# Patient Record
Sex: Male | Born: 1945
Health system: Southern US, Community
[De-identification: ages and names within clinical notes are randomized; demographics above are authoritative.]

## PROBLEM LIST (undated history)

## (undated) ENCOUNTER — Emergency Department (HOSPITAL_COMMUNITY): Admission: EM | Payer: PPO | Source: Home / Self Care

## (undated) DIAGNOSIS — K219 Gastro-esophageal reflux disease without esophagitis: Secondary | ICD-10-CM

## (undated) DIAGNOSIS — I471 Supraventricular tachycardia, unspecified: Secondary | ICD-10-CM

## (undated) DIAGNOSIS — F410 Panic disorder [episodic paroxysmal anxiety] without agoraphobia: Secondary | ICD-10-CM

## (undated) DIAGNOSIS — F329 Major depressive disorder, single episode, unspecified: Secondary | ICD-10-CM

## (undated) DIAGNOSIS — F419 Anxiety disorder, unspecified: Secondary | ICD-10-CM

## (undated) DIAGNOSIS — T4145XA Adverse effect of unspecified anesthetic, initial encounter: Secondary | ICD-10-CM

## (undated) DIAGNOSIS — F32A Depression, unspecified: Secondary | ICD-10-CM

## (undated) DIAGNOSIS — I2510 Atherosclerotic heart disease of native coronary artery without angina pectoris: Secondary | ICD-10-CM

## (undated) DIAGNOSIS — I252 Old myocardial infarction: Secondary | ICD-10-CM

## (undated) DIAGNOSIS — F4310 Post-traumatic stress disorder, unspecified: Secondary | ICD-10-CM

## (undated) DIAGNOSIS — C801 Malignant (primary) neoplasm, unspecified: Secondary | ICD-10-CM

## (undated) DIAGNOSIS — Z87442 Personal history of urinary calculi: Secondary | ICD-10-CM

## (undated) DIAGNOSIS — M1990 Unspecified osteoarthritis, unspecified site: Secondary | ICD-10-CM

## (undated) DIAGNOSIS — H409 Unspecified glaucoma: Secondary | ICD-10-CM

## (undated) DIAGNOSIS — T8859XA Other complications of anesthesia, initial encounter: Secondary | ICD-10-CM

## (undated) DIAGNOSIS — I219 Acute myocardial infarction, unspecified: Secondary | ICD-10-CM

## (undated) DIAGNOSIS — I255 Ischemic cardiomyopathy: Secondary | ICD-10-CM

## (undated) DIAGNOSIS — R519 Headache, unspecified: Secondary | ICD-10-CM

## (undated) DIAGNOSIS — E785 Hyperlipidemia, unspecified: Secondary | ICD-10-CM

## (undated) DIAGNOSIS — R51 Headache: Secondary | ICD-10-CM

## (undated) HISTORY — PX: TONSILLECTOMY: SUR1361

## (undated) HISTORY — DX: Atherosclerotic heart disease of native coronary artery without angina pectoris: I25.10

## (undated) HISTORY — DX: Old myocardial infarction: I25.2

## (undated) HISTORY — DX: Ischemic cardiomyopathy: I25.5

## (undated) HISTORY — PX: LITHOTRIPSY: SUR834

## (undated) HISTORY — PX: OTHER SURGICAL HISTORY: SHX169

---

## 1997-10-27 ENCOUNTER — Emergency Department (HOSPITAL_COMMUNITY): Admission: EM | Admit: 1997-10-27 | Discharge: 1997-10-27 | Payer: Self-pay | Admitting: Emergency Medicine

## 1997-10-30 ENCOUNTER — Ambulatory Visit (HOSPITAL_COMMUNITY): Admission: RE | Admit: 1997-10-30 | Discharge: 1997-10-30 | Payer: Self-pay | Admitting: Family Medicine

## 1998-07-03 ENCOUNTER — Encounter: Payer: Self-pay | Admitting: Internal Medicine

## 1998-07-03 ENCOUNTER — Ambulatory Visit (HOSPITAL_COMMUNITY): Admission: RE | Admit: 1998-07-03 | Discharge: 1998-07-03 | Payer: Self-pay | Admitting: Internal Medicine

## 1998-07-24 ENCOUNTER — Encounter: Payer: Self-pay | Admitting: Internal Medicine

## 1998-07-24 ENCOUNTER — Ambulatory Visit (HOSPITAL_COMMUNITY): Admission: RE | Admit: 1998-07-24 | Discharge: 1998-07-24 | Payer: Self-pay | Admitting: Neurology

## 1998-07-24 ENCOUNTER — Emergency Department (HOSPITAL_COMMUNITY): Admission: EM | Admit: 1998-07-24 | Discharge: 1998-07-25 | Payer: Self-pay

## 1999-02-03 ENCOUNTER — Encounter: Admission: RE | Admit: 1999-02-03 | Discharge: 1999-02-03 | Payer: Self-pay | Admitting: Otolaryngology

## 2001-09-25 ENCOUNTER — Ambulatory Visit (HOSPITAL_COMMUNITY): Admission: RE | Admit: 2001-09-25 | Discharge: 2001-09-25 | Payer: Self-pay | Admitting: Gastroenterology

## 2001-09-25 ENCOUNTER — Encounter: Payer: Self-pay | Admitting: Gastroenterology

## 2001-10-18 ENCOUNTER — Encounter: Payer: Self-pay | Admitting: Pulmonary Disease

## 2001-10-18 ENCOUNTER — Encounter: Admission: RE | Admit: 2001-10-18 | Discharge: 2001-10-18 | Payer: Self-pay | Admitting: Pulmonary Disease

## 2001-10-19 ENCOUNTER — Observation Stay (HOSPITAL_COMMUNITY): Admission: EM | Admit: 2001-10-19 | Discharge: 2001-10-20 | Payer: Self-pay | Admitting: Emergency Medicine

## 2001-10-20 ENCOUNTER — Encounter: Payer: Self-pay | Admitting: Urology

## 2001-10-25 ENCOUNTER — Ambulatory Visit (HOSPITAL_BASED_OUTPATIENT_CLINIC_OR_DEPARTMENT_OTHER): Admission: RE | Admit: 2001-10-25 | Discharge: 2001-10-25 | Payer: Self-pay | Admitting: Urology

## 2001-10-25 ENCOUNTER — Encounter: Payer: Self-pay | Admitting: Urology

## 2001-11-08 ENCOUNTER — Encounter: Payer: Self-pay | Admitting: Urology

## 2001-11-08 ENCOUNTER — Encounter: Admission: RE | Admit: 2001-11-08 | Discharge: 2001-11-08 | Payer: Self-pay | Admitting: Urology

## 2001-12-07 ENCOUNTER — Encounter: Admission: RE | Admit: 2001-12-07 | Discharge: 2001-12-07 | Payer: Self-pay | Admitting: Urology

## 2001-12-07 ENCOUNTER — Encounter: Payer: Self-pay | Admitting: Urology

## 2001-12-13 ENCOUNTER — Ambulatory Visit (HOSPITAL_BASED_OUTPATIENT_CLINIC_OR_DEPARTMENT_OTHER): Admission: RE | Admit: 2001-12-13 | Discharge: 2001-12-13 | Payer: Self-pay | Admitting: Urology

## 2001-12-13 ENCOUNTER — Encounter: Payer: Self-pay | Admitting: Urology

## 2001-12-25 ENCOUNTER — Encounter: Admission: RE | Admit: 2001-12-25 | Discharge: 2001-12-25 | Payer: Self-pay | Admitting: Urology

## 2001-12-25 ENCOUNTER — Encounter: Payer: Self-pay | Admitting: Urology

## 2002-12-22 ENCOUNTER — Emergency Department (HOSPITAL_COMMUNITY): Admission: EM | Admit: 2002-12-22 | Discharge: 2002-12-22 | Payer: Self-pay | Admitting: Emergency Medicine

## 2002-12-22 ENCOUNTER — Encounter: Payer: Self-pay | Admitting: Emergency Medicine

## 2003-01-06 ENCOUNTER — Ambulatory Visit (HOSPITAL_COMMUNITY): Admission: RE | Admit: 2003-01-06 | Discharge: 2003-01-06 | Payer: Self-pay | Admitting: Urology

## 2003-01-06 ENCOUNTER — Encounter: Payer: Self-pay | Admitting: Urology

## 2003-09-12 ENCOUNTER — Ambulatory Visit (HOSPITAL_COMMUNITY): Admission: RE | Admit: 2003-09-12 | Discharge: 2003-09-12 | Payer: Self-pay | Admitting: Gastroenterology

## 2003-09-16 ENCOUNTER — Ambulatory Visit (HOSPITAL_COMMUNITY): Admission: RE | Admit: 2003-09-16 | Discharge: 2003-09-16 | Payer: Self-pay | Admitting: Gastroenterology

## 2003-10-23 ENCOUNTER — Ambulatory Visit (HOSPITAL_COMMUNITY): Admission: RE | Admit: 2003-10-23 | Discharge: 2003-10-23 | Payer: Self-pay | Admitting: Gastroenterology

## 2004-04-19 ENCOUNTER — Ambulatory Visit: Payer: Self-pay | Admitting: Pulmonary Disease

## 2005-06-20 ENCOUNTER — Ambulatory Visit: Payer: Self-pay | Admitting: Pulmonary Disease

## 2005-06-22 ENCOUNTER — Ambulatory Visit: Payer: Self-pay

## 2005-10-05 ENCOUNTER — Emergency Department (HOSPITAL_COMMUNITY): Admission: EM | Admit: 2005-10-05 | Discharge: 2005-10-05 | Payer: Self-pay | Admitting: Emergency Medicine

## 2005-10-07 ENCOUNTER — Ambulatory Visit: Payer: Self-pay | Admitting: Internal Medicine

## 2005-12-26 ENCOUNTER — Ambulatory Visit: Payer: Self-pay | Admitting: Internal Medicine

## 2006-08-29 ENCOUNTER — Ambulatory Visit: Payer: Self-pay | Admitting: Pulmonary Disease

## 2006-08-29 LAB — CONVERTED CEMR LAB
ALT: 17 units/L (ref 0–40)
AST: 19 units/L (ref 0–37)
Basophils Relative: 0.6 % (ref 0.0–1.0)
Bilirubin, Direct: 0.1 mg/dL (ref 0.0–0.3)
CO2: 30 meq/L (ref 19–32)
Calcium: 8.9 mg/dL (ref 8.4–10.5)
Chloride: 106 meq/L (ref 96–112)
Creatinine, Ser: 1.2 mg/dL (ref 0.4–1.5)
Eosinophils Absolute: 0.1 10*3/uL (ref 0.0–0.6)
Eosinophils Relative: 2.6 % (ref 0.0–5.0)
GFR calc non Af Amer: 65 mL/min
Glucose, Bld: 92 mg/dL (ref 70–99)
HCT: 43 % (ref 39.0–52.0)
MCV: 94 fL (ref 78.0–100.0)
Neutrophils Relative %: 59.2 % (ref 43.0–77.0)
PSA: 1.26 ng/mL
PSA: 1.26 ng/mL (ref 0.10–4.00)
Platelets: 223 10*3/uL (ref 150–400)
RBC: 4.58 M/uL (ref 4.22–5.81)
Sodium: 142 meq/L (ref 135–145)
Total Bilirubin: 1.1 mg/dL (ref 0.3–1.2)
Total Protein: 6.3 g/dL (ref 6.0–8.3)
Triglycerides: 118 mg/dL (ref 0–149)
VLDL: 24 mg/dL (ref 0–40)
WBC: 5.1 10*3/uL (ref 4.5–10.5)

## 2006-10-09 ENCOUNTER — Ambulatory Visit: Payer: Self-pay | Admitting: Pulmonary Disease

## 2006-10-09 ENCOUNTER — Ambulatory Visit: Payer: Self-pay | Admitting: Cardiology

## 2006-12-09 ENCOUNTER — Emergency Department (HOSPITAL_COMMUNITY): Admission: EM | Admit: 2006-12-09 | Discharge: 2006-12-09 | Payer: Self-pay | Admitting: Emergency Medicine

## 2006-12-28 ENCOUNTER — Encounter: Payer: Self-pay | Admitting: Pulmonary Disease

## 2006-12-28 DIAGNOSIS — F411 Generalized anxiety disorder: Secondary | ICD-10-CM | POA: Insufficient documentation

## 2006-12-28 DIAGNOSIS — Z87442 Personal history of urinary calculi: Secondary | ICD-10-CM

## 2006-12-28 DIAGNOSIS — R002 Palpitations: Secondary | ICD-10-CM | POA: Insufficient documentation

## 2006-12-28 DIAGNOSIS — K5730 Diverticulosis of large intestine without perforation or abscess without bleeding: Secondary | ICD-10-CM | POA: Insufficient documentation

## 2006-12-28 DIAGNOSIS — E7800 Pure hypercholesterolemia, unspecified: Secondary | ICD-10-CM | POA: Insufficient documentation

## 2006-12-28 DIAGNOSIS — K210 Gastro-esophageal reflux disease with esophagitis: Secondary | ICD-10-CM

## 2007-04-12 HISTORY — PX: SUPRAVENTRICULAR TACHYCARDIA ABLATION: SHX6106

## 2007-05-09 ENCOUNTER — Ambulatory Visit: Payer: Self-pay | Admitting: Internal Medicine

## 2007-05-11 ENCOUNTER — Telehealth (INDEPENDENT_AMBULATORY_CARE_PROVIDER_SITE_OTHER): Payer: Self-pay | Admitting: *Deleted

## 2007-05-11 ENCOUNTER — Encounter: Payer: Self-pay | Admitting: Pulmonary Disease

## 2007-05-16 ENCOUNTER — Encounter: Payer: Self-pay | Admitting: Pulmonary Disease

## 2007-05-22 ENCOUNTER — Telehealth: Payer: Self-pay | Admitting: Pulmonary Disease

## 2007-08-15 ENCOUNTER — Telehealth (INDEPENDENT_AMBULATORY_CARE_PROVIDER_SITE_OTHER): Payer: Self-pay | Admitting: *Deleted

## 2007-09-11 ENCOUNTER — Ambulatory Visit: Payer: Self-pay | Admitting: Internal Medicine

## 2007-09-11 DIAGNOSIS — R51 Headache: Secondary | ICD-10-CM

## 2007-09-11 DIAGNOSIS — R519 Headache, unspecified: Secondary | ICD-10-CM | POA: Insufficient documentation

## 2007-09-13 ENCOUNTER — Telehealth: Payer: Self-pay | Admitting: Adult Health

## 2007-09-13 LAB — CONVERTED CEMR LAB
AST: 21 units/L (ref 0–37)
Albumin: 3.7 g/dL (ref 3.5–5.2)
Alkaline Phosphatase: 81 units/L (ref 39–117)
BUN: 16 mg/dL (ref 6–23)
Basophils Absolute: 0 10*3/uL (ref 0.0–0.1)
Chloride: 105 meq/L (ref 96–112)
Creatinine, Ser: 1.1 mg/dL (ref 0.4–1.5)
Eosinophils Absolute: 0.1 10*3/uL (ref 0.0–0.7)
Eosinophils Relative: 1.9 % (ref 0.0–5.0)
GFR calc Af Amer: 87 mL/min
GFR calc non Af Amer: 72 mL/min
HCT: 38.7 % — ABNORMAL LOW (ref 39.0–52.0)
MCHC: 36.1 g/dL — ABNORMAL HIGH (ref 30.0–36.0)
MCV: 91.4 fL (ref 78.0–100.0)
Monocytes Absolute: 0.5 10*3/uL (ref 0.1–1.0)
Neutrophils Relative %: 59.6 % (ref 43.0–77.0)
Platelets: 213 10*3/uL (ref 150–400)
Potassium: 4.5 meq/L (ref 3.5–5.1)
Sed Rate: 10 mm/hr (ref 0–16)
Total Bilirubin: 0.8 mg/dL (ref 0.3–1.2)

## 2007-09-14 ENCOUNTER — Encounter: Payer: Self-pay | Admitting: Pulmonary Disease

## 2007-09-14 ENCOUNTER — Encounter: Payer: Self-pay | Admitting: Adult Health

## 2007-09-14 ENCOUNTER — Ambulatory Visit: Payer: Self-pay

## 2007-10-02 ENCOUNTER — Ambulatory Visit: Payer: Self-pay | Admitting: Pulmonary Disease

## 2007-10-03 ENCOUNTER — Ambulatory Visit: Payer: Self-pay | Admitting: Pulmonary Disease

## 2007-10-07 LAB — CONVERTED CEMR LAB
CO2: 30 meq/L (ref 19–32)
Calcium: 9.4 mg/dL (ref 8.4–10.5)
Cholesterol: 253 mg/dL (ref 0–200)
Creatinine, Ser: 1.1 mg/dL (ref 0.4–1.5)
GFR calc Af Amer: 87 mL/min
Glucose, Bld: 96 mg/dL (ref 70–99)
HDL: 39.1 mg/dL (ref >39.0)
Triglycerides: 160 mg/dL — ABNORMAL HIGH (ref 0–149)

## 2007-10-09 ENCOUNTER — Encounter (INDEPENDENT_AMBULATORY_CARE_PROVIDER_SITE_OTHER): Payer: Self-pay | Admitting: *Deleted

## 2008-07-29 ENCOUNTER — Telehealth: Payer: Self-pay | Admitting: Pulmonary Disease

## 2008-08-01 ENCOUNTER — Encounter: Payer: Self-pay | Admitting: Pulmonary Disease

## 2010-05-02 ENCOUNTER — Encounter: Payer: Self-pay | Admitting: Pulmonary Disease

## 2010-05-25 ENCOUNTER — Telehealth: Payer: Self-pay | Admitting: Pulmonary Disease

## 2010-06-02 NOTE — Progress Notes (Signed)
Summary: regarding a letter that dr Kriste Basque wrote to the [facility]  Phone Note Call from Patient   Caller: Spouse/Trevor Branch Call For: Trevor Branch Summary of Call: Patients wife phoned wanted to leave a message for Dr Kirstie Mirza nurse regarding the letter that Dr Kriste Basque wrote for the [facility]. Stated that Dr Kriste Basque advised if they needed any further help with this to let him know and she needs to speak to the nurse regarding this. She can be reached [phone] Initial call taken by: Vedia Coffer,  May 25, 2010 10:04 AM  Follow-up for Phone Call        Spoke with pt wife and she staets that they are needing the wording changed in the leter that SN wrote for the pt disability though the [place]. She has a form that explains how the letter needs to be written. I advised her to fax this form to triage fax. Paper received and given to [name]. [name] CMA  May 25, 2010 11:39 AM   Additional Follow-up for Phone Call Additional follow up Details #1::        SN has reviewed the letter from pts wife and he did call and leave a message for pts wife----she is to call back for any questions or concerns [name] [facility]  May 25, 2010 4:59 PM

## 2010-08-24 NOTE — Assessment & Plan Note (Signed)
[facility]                             PULMONARY OFFICE NOTE   BHAVIN, MONJARAZ                         MRN:          [id number]  DATE:10/09/2006                            DOB:          1945-06-14    HISTORY OF PRESENT ILLNESS:  The patient is a 65 year old white male  patient of Dr. Jodelle Green, who has a known history of borderline blood  pressure, SVT, and hyperlipidemia and presents today for an acute office  visit.  The patient complained this morning while he was at work at  approximately 10 a.m. that he had an episode in which he had blurred  vision, felt very strange all over with some tingling into his hands and  arms, increased on the left, and somewhat dizzy.  The patient felt that  he was not himself and the symptoms resolved approximately 30 minutes  later.  The patient denies symptoms at present; however, complains that  he feels extremely weak and tired.  The patient has been undergoing an  enormous amount of stress lately, with his mother-in-law is actively  dying and is under Hospice care.  The patient denied any associated  chest pain, palpitations, presyncopal/syncopal episodes, abdominal pain,  nausea, vomiting, slurred speech, loss or vision, or extremity weakness.  The patient did complain he had a dull headache that was bilateral;  however, denies any severe features, and symptoms seem to have resolved  at present.  The patient does have a family history in which his father  has had a history of a stroke.  The patient denies any known previous  episodes or any new medications recently.  The patient did have a  complete physical exam in May of this year with unremarkable labs except  for persistent hyperlipidemia.   PAST MEDICAL HISTORY:  1. Borderline hypertension.  2. Supraventricular tachycardia followed by Dr. Graciela Husbands.  3. Gastroesophageal reflux.  4. Kidney stones.  5. Atypical chest pain with negative Cardiolite in March of  2007.  6. Syncopal episode in March of 2000 with a normal echo with EF of 60      and normal left ventricular function.  There was some moderate      right ventricular and right atrial enlargement with no evidence of      pulmonary hypertension.  7. Anxiety.  8. Hyperlipidemia, previously started on Statin with patient      intolerance and intolerance to CRESTOR and LIPITOR.  The patient      has been referred to lipid clinic; however, refuses.   CURRENT MEDICATIONS:  1. Nexium 40 mg daily.  2. Cardizem 120 mg daily.  3. Multivitamin daily.  4. Xanax p.r.n.   ALLERGIES:  No known drug allergies.   FAMILY HISTORY:  Positive for stroke, positive for arteriosclerotic  heart disease..   SOCIAL HISTORY:  The patient works full time.  He is a former smoker.  He denies any alcohol use.  He is married.   PHYSICAL EXAMINATION:  The patient is a very pleasant male in no acute  distress.  He is afebrile.  Blood pressure 122/80, O2 saturations 97% on room air.  Weight is at 193.  HEENT:  PERRLA.  EOMI without nystagmus.  Nasal mucosa is pale.  TMs are  normal.  Posterior pharynx is clear.  Facial features are symmetrical  bilaterally.  NECK:  Supple without cervical adenopathy.  No JVD.  No thyromegaly or  palpable masses.  Carotids are equal with positive upstrokes bilaterally  without any bruits.  Lung sounds are clear to auscultation bilaterally.  CARDIAC:  S1/S2 without murmur, rub, or gallop.  ABDOMEN:  Soft and nontender.  EXTREMITIES:  Warm without any calf cyanosis, clubbing, or edema.  NEUROLOGIC:  Alert and oriented x3.  Cranial nerves II-XII are intact.  Equal strength in the upper and lower extremities.  Moves all  extremities well.  Negative Romberg.  Normal gait.  Negative pronator  drift.  Negative tremor.   IMPRESSION/PLAN:  Acute onset of visual changes and weakness.  The  patient's neuro exam is unremarkable. However, symptoms could be  representative of an  underlying transient ischemic attack.  The patient  will be sent over for a stat CT of head to rule out stroke or bleed and  will follow up on those results accordingly.  I suspect the patient, if  negative, will need a further evaluation with MRI, MRA and/or a  neurological evaluation.  The patient does have a few risk factors  including age, hyperlipidemia, mild hypertension, and family history.  The patient is advised that if symptoms return or worsen, he is to  contact emergency services immediately.  Will follow up as soon as CT  results are available.      Rubye Oaks, NP  Electronically Signed      Lonzo Cloud. Kriste Basque, MD  Electronically Signed   TP/MedQ  DD: 10/09/2006  DT: 10/10/2006  Job #: [id number]

## 2010-08-24 NOTE — Letter (Signed)
May 09, 2007    Lidia Collum, MD.  [facility]  [facility] [id number]  [place], [place] [address]   RE:  RAMELO, OETKEN  MRN:  [id number]  /  DOB:  1945-12-21   Dear Vista Deck,   This letter is to introduce you to Mr. Angello Chien. He is a 65 year old  gentleman that I have known for a long time who has documented  supraventricular tachycardia in the setting of underlying right bundle  branch block.  These episodes occur a couple of times a year and have  prompted hospitalization. His biggest fear about proceeding with  catheter ablation is the possibility of heart block and a pacemaker.   I told him that you might be able to serve him better than I could as  you would have cryotherapy available in the event that he had an  anterior mid septal pathway.  He would like to take advantage of your  expertise and your available technologies.   His past medical history in addition to the above is notable for  atypical chest pain with a negative Myoview in March 2007 with normal  left ventricular function, borderline hypertension and recurrent  episodes of presyncope and syncope which is felt to be neurally mediated  in the past.   His current medications include Cardizem 120, verapamil 40-80 p.r.n. and  aspirin as well as Nexium.   He has no known allergies.   On examination, his blood pressure is 127/75, his pulse was 52.  His  lungs were clear, heart sounds were regular and the extremities were  without edema.   His electrocardiogram demonstrated sinus rhythm today at 52 with  intervals of 0.2 0/0.11/0.43 with an incomplete right bundle branch  block.   Tris, thank you very much for your help with Mr. Mcnab. I look forward to  hearing from you about him.    Sincerely,      Duke Salvia, MD, [facility]  Electronically Signed    SCK/MedQ  DD: 05/09/2007  DT: 05/10/2007  Job #: [id number]

## 2010-08-27 NOTE — Letter (Signed)
November 17, 2006    Trevor Branch  [address]  [place], [place] [address]   RE:  [name], [name]  MRN:  [id number]  /  DOB:  19-Dec-1945   To whom it may concern:   Mr. Trevor Branch is 65 year old patient of mine followed for general  medical purposes. He requested a letter stating that he has tinnitus  (ringing in the ears), rapid heartbeat, and anxiety for [employer] disability  purposes.   He also has a history of hypertension which is well controlled on  medication; gastroesophageal reflux disease controlled on Nexium; and  hypercholesterolemia which he chooses to treat with diet alone.   I hope this information will suffice for your purposes, but if I can be  of further assistance, please feel free to contact my office.    Sincerely,      Lonzo Cloud. Kriste Basque, MD  Electronically Signed    SMN/MedQ  DD: 11/17/2006  DT: 11/18/2006  Job #: [id number]

## 2010-08-27 NOTE — Op Note (Signed)
[facility]  Patient:    Trevor Branch, Trevor Branch Visit Number: [id number] MRN: [id number]          Service Type: MED Location: 3W 0365 01 Attending Physician:  Londell Moh Dictated by:   Jamison Neighbor, M.D. Proc. Date: 10/20/01 Admit Date:  10/19/2001 Discharge Date: 10/20/2001                             Operative Report  SERVICE:  Urology.  PREOPERATIVE DIAGNOSIS:  Left renal calculus.  POSTOPERATIVE DIAGNOSIS:  Left proximal ureteral calculus.  PROCEDURE:  Cystoscopy, left retrograde and left double J catheter insertion.  SURGEON:  Jamison Neighbor, M.D.  ANESTHESIA:  General.  COMPLICATIONS:  None.  DRAINS:  6 French x 26 cm double J catheter.  BRIEF HISTORY:  This 65 year old male was scheduled to undergo ESWL for the treatment of two stones in the left kidney. By report, the larger of these sat within the renal pelvis and measures 9 cm and that is the stone that appears to be giving the most problem. There is a smaller 4 mm stone out in the periphery. The patient developed intractable renal colic last night and required admission for pain management. The patient now to undergo stent placement in order to decompress this kidney in preparation for his ESWL later this week. The patient understands the risks and benefits of the procedure. Specifically he does understand that there can be symptoms associated with the stent and that there may be urgency, frequency, bladder spasms, hematuria or flank pain after the procedure. He also understands that he still will require the lithotripsy and is aware of the fact that there is no guarantee that lithotripsy will completely removed the stone. We discussed some of the options that could be available such as percutaneous procedures or ureteroscopic procedures if the stone is not fragmented by the lithotripsy. Full and informed consent was obtained.  DESCRIPTION OF PROCEDURE:  After successful  induction of general anesthesia, the patient was placed in the dorsal lithotomy position, prepped with Betadine, draped in the usual sterile fashion. Cystoscopy was performed, the urethra was visualized in its entirety and was found to be normal. Beyond the verumontanum the patient had modest prostatic enlargement with no signs of any obstruction. The bladder was carefully inspected and was free of any tumor or stones. Both ureteral orifices were normal in configuration and location. The left ureteral orifice was cannulated and a retrograde study was performed. A filling defect consistent with a stone was seen in the proximal ureter near the transverse processes between L3 and L4. The patient had some hydronephrosis above that level. The smaller stone was not well visualized on retrograde. The guidewire was passed beyond that point, the double J catheter was passed over the guidewire and allowed to coil normally in the pelvis as well as within the bladder. The bladder was drained, a B&O suppository was inserted. The patient tolerated the procedure well and was taken to the recovery room in good condition. Dictated by:   Jamison Neighbor, M.D. Attending Physician:  Londell Moh DD:  10/20/01 TD:  10/22/01 Job: [id number]

## 2010-08-27 NOTE — Assessment & Plan Note (Signed)
[facility]                              CARDIOLOGY OFFICE NOTE   [name], [name]                         MRN:          [id number]  DATE:12/26/2005                            DOB:          Oct 18, 1945    Trevor Branch is seen because of recurrent problems with tachycardia.  I saw him  in June and he has had multiple episodes since that time.  We had given him  Verapamil to take on a p.r.n. basis.  In early September, he called because  he was having increasing symptoms and he started taking it every day which  has been complicated now by dizziness, light-headedness.  His blood  pressure, I should note, is 140/92. Pulse was 54.  Lungs were clear.  Heart  sounds were regular.  The extremities were without edema.   Review of his prior evaluation included a Myoview scan that was done in  March of this year that was normal.   Review of his tracings demonstrates a narrow QRS tachycardia in the context  of his right bundle branch block.  This was dated September 14, 2000.   IMPRESSION:  1. Supraventricular tachycardia.  2. Intolerance of VERAPAMIL.  3. Right bundle branch block.  4. Hypertension.  5. Question issue related to exposure to Versed previously.  (Please see     notes from Dr. Russella Dar from June 2003).  6. Negative Myoview scan.   I have reviewed with Trevor Branch, again, treatment options. I have given a  prescription today for Cardizem 120.  We have reviewed the issues related to  ablation including but not limited to death, perforation, stroke, heart  attack, and heart block requiring pacemaker implantation.  He would like to  think about this a little bit more and talk about it with his wife.  We will  look forward to hearing from him in the next couple of weeks.  In the event  that the patient would like to proceed with an invasive procedure, we will  have to discuss with him whether he would like it to  be done under general anesthesia given the  concerns raised in the chart  previously about Versed and his tolerance the same.                               Duke Salvia, MD, [facility]    SCK/MedQ  DD:  12/26/2005  DT:  12/27/2005  Job #:  [id number]   cc:   Lonzo Cloud. Kriste Basque, MD

## 2010-08-27 NOTE — H&P (Signed)
[facility]  Patient:    Trevor Branch, Trevor Branch Visit Number: [id number] MRN: [id number]          Service Type: MED Location: 3W 0365 01 Attending Physician:  Londell Moh Dictated by:   Jamison Neighbor, M.D. Admit Date:  10/19/2001 Discharge Date: 10/20/2001                           History and Physical  ADMISSION DIAGNOSES:  Left renal colic  HISTORY OF PRESENT ILLNESS:  This 65 year old male is scheduled to undergo ESWL later this week.  The patient developed intractable colic and needs to be admitted for placement of a double J catheter to decompress the kidney.  ____________________________, the patients past medical history remarkable for supraventricular tachycardia.  He is also known to have esophageal reflux disease.  CURRENT MEDICATIONS:  Verapamil which he takes on a p.r.n. basis for supraventricular tachycardia.  He is on Nexium which he takes on a p.r.n. basis.  He is also on Timoptic eye drops each eye and takes Vicodin for the pain associated with his current renal calculus.  The patient is scheduled to have lithotripsy done by Barron Alvine, M.D., later on this week.  Aside from the above mentioned medical problems, the patient also had tonsillectomy performed in the past.  SOCIAL HISTORY:  Unremarkable.  He does not use tobacco or alcohol.  PHYSICAL EXAMINATION:  GENERAL APPEARANCE:  He is a well-developed, well-nourished male who is having significant problems with left flank pain.  VITAL SIGNS:  Temperature 97.2, pulse 82, respiratory rate 20, blood pressure 134/68.  HEENT:  Unremarkable.  Normocephalic and atraumatic.  NECK:  Supple, no adenopathy or thyromegaly.  LUNGS:  Clear.  CARDIOVASCULAR:  Regular rate and rhythm with no murmurs, thrills, rubs, gallops, or heaves.  ABDOMEN:  Soft, nontender with the exception of the left upper quadrant and left flank area which was where the patient had significant renal colic  type pain.  GU:  Unremarkable.  Testicles normal in size, shape and consistency.  Penis is free of any lesions.  RECTAL:  Examination not performed.  EXTREMITIES:  No clubbing, cyanosis, or edema.  NEUROLOGICAL:  Cranial nerves II-XII appear grossly intact.  IMPRESSION:  Left renal colic.  PLAN:  Admit for pain management and stent placement. Dictated by:   Jamison Neighbor, M.D. Attending Physician:  Londell Moh DD:  10/20/01 TD:  10/23/01 Job: [id number]

## 2010-08-27 NOTE — Letter (Signed)
May 29, 2007    To Whom It May Concern   RE:  [name], [name]  MRN:  [id number]  /  DOB:  10-20-1945   Mr. Trevor Branch is a 65 year old patient of mine followed for general  medical purposes.  He has a history of hypertension, chest pain and  heart palpitations.  He also suffers from anxiety and panic attacks and  takes alprazolam as needed.   He has requested that I dictate this note to indicate that his job is  stressing him out and that he does not feel that he is able to work.   I hope this information will be helpful to you in your determination,  but if I can be of further assistance please feel free to contact my  office.    Sincerely,      Lonzo Cloud. Kriste Basque, MD  Electronically Signed    SMN/MedQ  DD: 05/29/2007  DT: 05/30/2007  Job #: [id number]

## 2010-08-27 NOTE — Letter (Signed)
October 20, 2006    C. Lesia Sago, M.D.  [address]  [address]  [place], [place] [address]   RE:  Trevor, Branch  MRN:  [id number]  /  DOB:  05/19/1945   Dear Mellody Dance:   Mr. Trevor Branch is a 65 year old gentleman who I follow for general  medical purposes.  He recently presented with complaints of headache,  blurred vision, intermittent confusion and had a negative neurologic  exam.  I performed a CT of his brain and this was negative.  He was  quite concerned about his symptoms and requested further evaluation.   His medical history includes:  1. Hypertension which is controlled on Cardizem 120 mg p.o. daily.  2. He had a history of tachycardia in the past and a right bundle      branch block on his EKG.  He has been seen by Dr. Graciela Husbands previously      and had a negative Myoview scan last year.  3. The patient also has gastroesophageal reflux disease and takes      Nexium for this.  4. He has a history of kidney stones and had a previous lithotripsy      under the care of Dr. Isabel Caprice.  5. He has mild hyperlipidemia with a cholesterol of 243 and an LDL of      171 but has chosen to treat this with diet alone (refusing Statin      therapy).  6. He also has some mild anxiety and uses Xanax as needed.   He lists an intolerance to:  1. VERAPAMIL.  2. VERSED.   He is a former smoker.   I am including copies of recent labs and CT results and as always  appreciate your consultation on this nice gentleman.    Sincerely,      Lonzo Cloud. Kriste Basque, MD  Electronically Signed    SMN/MedQ  DD: 10/20/2006  DT: 10/21/2006  Job #: [id number]

## 2011-01-21 LAB — CBC
HCT: 43.6
MCHC: 34
MCV: 93.6
Platelets: 246
RDW: 13.1

## 2011-01-21 LAB — DIFFERENTIAL
Basophils Absolute: 0
Basophils Relative: 0
Eosinophils Absolute: 0.1
Eosinophils Relative: 2
Neutrophils Relative %: 69

## 2011-01-21 LAB — POCT CARDIAC MARKERS
Myoglobin, poc: 58.3
Operator id: 284141

## 2011-01-21 LAB — I-STAT 8, (EC8 V) (CONVERTED LAB)
Bicarbonate: 27.2 — ABNORMAL HIGH
Glucose, Bld: 89
TCO2: 29
pCO2, Ven: 49.8
pH, Ven: 7.345 — ABNORMAL HIGH

## 2011-01-21 LAB — POCT I-STAT CREATININE: Creatinine, Ser: 1.1

## 2015-05-19 DIAGNOSIS — Z1211 Encounter for screening for malignant neoplasm of colon: Secondary | ICD-10-CM | POA: Diagnosis not present

## 2015-05-19 DIAGNOSIS — J069 Acute upper respiratory infection, unspecified: Secondary | ICD-10-CM | POA: Diagnosis not present

## 2015-05-19 DIAGNOSIS — K219 Gastro-esophageal reflux disease without esophagitis: Secondary | ICD-10-CM | POA: Diagnosis not present

## 2015-05-19 DIAGNOSIS — R05 Cough: Secondary | ICD-10-CM | POA: Diagnosis not present

## 2015-06-03 DIAGNOSIS — L570 Actinic keratosis: Secondary | ICD-10-CM | POA: Diagnosis not present

## 2015-06-03 DIAGNOSIS — L438 Other lichen planus: Secondary | ICD-10-CM | POA: Diagnosis not present

## 2015-06-03 DIAGNOSIS — L821 Other seborrheic keratosis: Secondary | ICD-10-CM | POA: Diagnosis not present

## 2015-06-03 DIAGNOSIS — D2271 Melanocytic nevi of right lower limb, including hip: Secondary | ICD-10-CM | POA: Diagnosis not present

## 2015-06-03 DIAGNOSIS — Z85828 Personal history of other malignant neoplasm of skin: Secondary | ICD-10-CM | POA: Diagnosis not present

## 2015-06-03 DIAGNOSIS — L720 Epidermal cyst: Secondary | ICD-10-CM | POA: Diagnosis not present

## 2015-06-10 DIAGNOSIS — I252 Old myocardial infarction: Secondary | ICD-10-CM

## 2015-06-10 HISTORY — DX: Old myocardial infarction: I25.2

## 2015-06-20 ENCOUNTER — Emergency Department (HOSPITAL_COMMUNITY): Payer: PPO

## 2015-06-20 ENCOUNTER — Inpatient Hospital Stay (HOSPITAL_COMMUNITY)
Admission: EM | Admit: 2015-06-20 | Discharge: 2015-06-25 | DRG: 247 | Disposition: A | Discharge status: Home / Self Care | Payer: PPO | Attending: Cardiovascular Disease | Admitting: Cardiovascular Disease

## 2015-06-20 ENCOUNTER — Encounter (HOSPITAL_COMMUNITY): Payer: Self-pay

## 2015-06-20 DIAGNOSIS — H409 Unspecified glaucoma: Secondary | ICD-10-CM | POA: Diagnosis not present

## 2015-06-20 DIAGNOSIS — R079 Chest pain, unspecified: Secondary | ICD-10-CM | POA: Diagnosis not present

## 2015-06-20 DIAGNOSIS — R071 Chest pain on breathing: Secondary | ICD-10-CM | POA: Diagnosis not present

## 2015-06-20 DIAGNOSIS — I10 Essential (primary) hypertension: Secondary | ICD-10-CM | POA: Diagnosis present

## 2015-06-20 DIAGNOSIS — K219 Gastro-esophageal reflux disease without esophagitis: Secondary | ICD-10-CM | POA: Diagnosis present

## 2015-06-20 DIAGNOSIS — R001 Bradycardia, unspecified: Secondary | ICD-10-CM | POA: Diagnosis present

## 2015-06-20 DIAGNOSIS — I25110 Atherosclerotic heart disease of native coronary artery with unstable angina pectoris: Secondary | ICD-10-CM | POA: Diagnosis present

## 2015-06-20 DIAGNOSIS — R0789 Other chest pain: Secondary | ICD-10-CM | POA: Diagnosis not present

## 2015-06-20 DIAGNOSIS — F4310 Post-traumatic stress disorder, unspecified: Secondary | ICD-10-CM | POA: Diagnosis not present

## 2015-06-20 DIAGNOSIS — Z8249 Family history of ischemic heart disease and other diseases of the circulatory system: Secondary | ICD-10-CM

## 2015-06-20 DIAGNOSIS — I214 Non-ST elevation (NSTEMI) myocardial infarction: Secondary | ICD-10-CM | POA: Diagnosis not present

## 2015-06-20 DIAGNOSIS — R7989 Other specified abnormal findings of blood chemistry: Secondary | ICD-10-CM | POA: Diagnosis not present

## 2015-06-20 DIAGNOSIS — I209 Angina pectoris, unspecified: Secondary | ICD-10-CM | POA: Diagnosis not present

## 2015-06-20 DIAGNOSIS — I2510 Atherosclerotic heart disease of native coronary artery without angina pectoris: Secondary | ICD-10-CM | POA: Diagnosis not present

## 2015-06-20 DIAGNOSIS — Z955 Presence of coronary angioplasty implant and graft: Secondary | ICD-10-CM

## 2015-06-20 DIAGNOSIS — M79603 Pain in arm, unspecified: Secondary | ICD-10-CM | POA: Diagnosis not present

## 2015-06-20 HISTORY — DX: Gastro-esophageal reflux disease without esophagitis: K21.9

## 2015-06-20 HISTORY — DX: Supraventricular tachycardia, unspecified: I47.10

## 2015-06-20 HISTORY — DX: Post-traumatic stress disorder, unspecified: F43.10

## 2015-06-20 HISTORY — DX: Supraventricular tachycardia: I47.1

## 2015-06-20 LAB — CBC
HCT: 38.1 % — ABNORMAL LOW (ref 39.0–52.0)
HEMATOCRIT: 40 % (ref 39.0–52.0)
HEMOGLOBIN: 13.1 g/dL (ref 13.0–17.0)
Hemoglobin: 12.9 g/dL — ABNORMAL LOW (ref 13.0–17.0)
MCH: 30.8 pg (ref 26.0–34.0)
MCH: 31.5 pg (ref 26.0–34.0)
MCHC: 32.8 g/dL (ref 30.0–36.0)
MCHC: 33.9 g/dL (ref 30.0–36.0)
MCV: 94.1 fL (ref 78.0–100.0)
MCV: 92.9 fL (ref 78.0–100.0)
PLATELETS: 182 10*3/uL (ref 150–400)
Platelets: 182 10*3/uL (ref 150–400)
RBC: 4.25 MIL/uL (ref 4.22–5.81)
RBC: 4.1 MIL/uL — ABNORMAL LOW (ref 4.22–5.81)
RDW: 13.2 % (ref 11.5–15.5)
RDW: 13.3 % (ref 11.5–15.5)
WBC: 4.5 10*3/uL (ref 4.0–10.5)
WBC: 4.1 10*3/uL (ref 4.0–10.5)

## 2015-06-20 LAB — CREATININE, SERUM
CREATININE: 1.22 mg/dL (ref 0.61–1.24)
GFR calc Af Amer: >60 mL/min (ref >60)
GFR calc non Af Amer: 58 mL/min — ABNORMAL LOW (ref >60)

## 2015-06-20 LAB — LIPID PANEL
CHOL/HDL RATIO: 4.4 ratio
Cholesterol: 192 mg/dL (ref 0–200)
HDL: 44 mg/dL (ref >40)
LDL CALC: 134 mg/dL — AB (ref 0–99)
Triglycerides: 71 mg/dL (ref <150)
VLDL: 14 mg/dL (ref 0–40)

## 2015-06-20 LAB — TROPONIN I
TROPONIN I: 0.72 ng/mL — AB (ref <0.031)
TROPONIN I: 0.77 ng/mL — AB (ref <0.031)
Troponin I: 0.76 ng/mL (ref <0.031)

## 2015-06-20 LAB — BASIC METABOLIC PANEL
ANION GAP: 7 (ref 5–15)
BUN: 26 mg/dL — ABNORMAL HIGH (ref 6–20)
CHLORIDE: 107 mmol/L (ref 101–111)
CO2: 28 mmol/L (ref 22–32)
Calcium: 9.1 mg/dL (ref 8.9–10.3)
Creatinine, Ser: 1.26 mg/dL — ABNORMAL HIGH (ref 0.61–1.24)
GFR calc Af Amer: >60 mL/min (ref >60)
GFR, EST NON AFRICAN AMERICAN: 56 mL/min — AB (ref >60)
Glucose, Bld: 102 mg/dL — ABNORMAL HIGH (ref 65–99)
POTASSIUM: 3.8 mmol/L (ref 3.5–5.1)
SODIUM: 142 mmol/L (ref 135–145)

## 2015-06-20 LAB — TSH: TSH: 2.26 u[IU]/mL (ref 0.350–4.500)

## 2015-06-20 LAB — GLUCOSE, CAPILLARY: Glucose-Capillary: 99 mg/dL (ref 65–99)

## 2015-06-20 LAB — MRSA PCR SCREENING: MRSA BY PCR: NEGATIVE

## 2015-06-20 LAB — I-STAT TROPONIN, ED: Troponin i, poc: 0.04 ng/mL (ref 0.00–0.08)

## 2015-06-20 LAB — HEPARIN LEVEL (UNFRACTIONATED)
HEPARIN UNFRACTIONATED: 0.47 [IU]/mL (ref 0.30–0.70)
HEPARIN UNFRACTIONATED: 0.51 [IU]/mL (ref 0.30–0.70)

## 2015-06-20 LAB — PROTIME-INR
INR: 1.03 (ref 0.00–1.49)
PROTHROMBIN TIME: 13.7 s (ref 11.6–15.2)

## 2015-06-20 MED ORDER — ACETAMINOPHEN 325 MG PO TABS: 650.0000 mg | ORAL_TABLET | ORAL | Status: DC | PRN

## 2015-06-20 MED ORDER — LATANOPROST 0.005 % OP SOLN
1.0000 [drp] | Freq: Every day | OPHTHALMIC | Status: DC
  Administered 2015-06-21 – 2015-06-25 (×4): 1 [drp] via OPHTHALMIC

## 2015-06-20 MED ORDER — ONDANSETRON HCL 4 MG/2ML IJ SOLN
4.0000 mg | Freq: Four times a day (QID) | INTRAMUSCULAR | Status: DC | PRN
  Administered 2015-06-22: 4 mg via INTRAVENOUS
  Filled 2015-06-20: qty 2

## 2015-06-20 MED ORDER — ENOXAPARIN SODIUM 40 MG/0.4ML ~~LOC~~ SOLN
40.0000 mg | SUBCUTANEOUS | Status: DC
  Filled 2015-06-20: qty 0.4

## 2015-06-20 MED ORDER — HEPARIN (PORCINE) IN NACL 100-0.45 UNIT/ML-% IJ SOLN
1150.0000 [IU]/h | INTRAMUSCULAR | Status: DC
  Administered 2015-06-20 – 2015-06-21 (×3): 1150 [IU]/h via INTRAVENOUS
  Filled 2015-06-20 (×3): qty 250

## 2015-06-20 MED ORDER — PANTOPRAZOLE SODIUM 40 MG PO TBEC
40.0000 mg | DELAYED_RELEASE_TABLET | Freq: Every day | ORAL | Status: DC
  Administered 2015-06-20: 40 mg via ORAL
  Filled 2015-06-20 (×2): qty 1

## 2015-06-20 MED ORDER — ALPRAZOLAM 0.5 MG PO TABS
0.5000 mg | ORAL_TABLET | ORAL | Status: DC | PRN
Start: 2015-06-20 — End: 2015-06-25
  Administered 2015-06-22: 0.5 mg via ORAL
  Filled 2015-06-20: qty 1

## 2015-06-20 MED ORDER — NITROGLYCERIN 2 % TD OINT
0.5000 [in_us] | TOPICAL_OINTMENT | Freq: Once | TRANSDERMAL | Status: AC
  Administered 2015-06-20: 0.5 [in_us] via TOPICAL
  Filled 2015-06-20: qty 1

## 2015-06-20 MED ORDER — MORPHINE SULFATE (PF) 2 MG/ML IV SOLN
2.0000 mg | INTRAVENOUS | Status: DC | PRN
  Administered 2015-06-22 – 2015-06-23 (×6): 2 mg via INTRAVENOUS
  Filled 2015-06-20 (×6): qty 1

## 2015-06-20 MED ORDER — HEPARIN BOLUS VIA INFUSION
4000.0000 [IU] | Freq: Once | INTRAVENOUS | Status: AC
  Administered 2015-06-20: 4000 [IU] via INTRAVENOUS
  Filled 2015-06-20: qty 4000

## 2015-06-20 MED ORDER — TIMOLOL MALEATE 0.5 % OP SOLN
1.0000 [drp] | Freq: Every day | OPHTHALMIC | Status: DC
  Administered 2015-06-21 – 2015-06-25 (×4): 1 [drp] via OPHTHALMIC

## 2015-06-20 NOTE — ED Notes (Signed)
Pt from home by [facility] for Chest pain. Pt states it started yesterday and went away then came back today on and off. Pt denies N,V. Pt had 1nito, 324 ASA with EMS.

## 2015-06-20 NOTE — Progress Notes (Signed)
Pharmacy: Re- heparin  Patient's a 70 y.o M admitted this morning with c/o CP and heparin started for ACS.  Plan is for cardiac cath on Monday 3/13.  Goal of Therapy:  Heparin level 0.3-0.7 units/ml Monitor platelets by anticoagulation protocol: Yes   Plan: - first heparin level now back therapeutic at 0.47. No bleeding documented - continue current rate of 1150 units/hr - will recheck another level at 2300 tonight to confirm before changing to daily monitoring  Dia Sitter, PharmD, BCPS 06/20/2015 5:25 PM

## 2015-06-20 NOTE — ED Notes (Signed)
CRITICAL VALUE ALERT  Critical value received:  Trop 0.72  Date of notification:  06/20/15  Time of notification: 0806  Critical value read back: yes  MD notified: Wendee Beavers, MD

## 2015-06-20 NOTE — Progress Notes (Addendum)
ANTICOAGULATION CONSULT NOTE - Initial Consult  Pharmacy Consult for Heparin Indication: chest pain/ACS  Allergies  Allergen Reactions  . Atorvastatin     REACTION: intolerant  . Rosuvastatin     REACTION: intolerant  . Imitrex [Sumatriptan] Palpitations    Patient Measurements: Height: 6\' 1"  (185.4 cm) Weight: 185 lb (83.915 kg) IBW/kg (Calculated) : 79.9 Heparin Dosing Weight:   Vital Signs: Temp: 97.6 F (36.4 C) (03/11 0744) Temp Source: Oral (03/11 0744) BP: 123/82 mmHg (03/11 0744) Pulse Rate: 54 (03/11 0744)  Labs:  Recent Labs  06/20/15 0257 06/20/15 0658  HGB 13.1 12.9*  HCT 40.0 38.1*  PLT 182 182  CREATININE 1.26* 1.22  TROPONINI  --  0.72*    Estimated Creatinine Clearance: 63.7 mL/min (by C-G formula based on Cr of 1.22).   Medical History: Past Medical History  Diagnosis Date  . GERD (gastroesophageal reflux disease)     Medications:  Scheduled:  . enoxaparin (LOVENOX) injection  40 mg Subcutaneous Q24H  . latanoprost  1 drop Both Eyes Daily  . pantoprazole  40 mg Oral Daily  . timolol  1 drop Both Eyes Daily    Assessment: 70yo male presenting overnight with somewhat atypical chest pain.  Initially chest pain resolved with NTG ointment, and heparin was not ordered.  Now with (+)Trop and repeat EKG with T-wave changes, to start heaprin.    Lovenox 40mg  has been ordered for VTE px, but no dose administered (verified with RN as this was was contradictory to MD note).  Cr 1.22 Hg 12.9 and pltc wnl  Goal of Therapy:  Heparin level 0.3-0.7 units/ml Monitor platelets by anticoagulation protocol: Yes   Plan:  Heparin 4000units IV x 1, 1150 units/hr Heparin level 6hr Daily HL, CBC Watch for s/s of bleeding D/C Lovenox  Gracy Bruins, PharmD Clinical Pharmacist [facility]

## 2015-06-20 NOTE — ED Provider Notes (Signed)
This is a 70 year old male admitted to the hospitalist service. Has received Lovenox and aspirin. Second troponin came back at 0.72 just increased from his previous of 0.04. Repeat EKG does show some T-wave changes in the septal leads. I discuss it with the triad team who will consult cardiology. As mentioned patient's already had aspirin and Lovenox and nitroglycerin.  <ECGINTERP>   EKG Interpretation   Date/Time:  Saturday June 20 2015 07:44:12 EST Ventricular Rate:  50 PR Interval:  203 QRS Duration: 116 QT Interval:  471 QTC Calculation: 429 R Axis:   -35 Text Interpretation:  Sinus rhythm Nonspecific intraventricular conduction  delay Probable anteroseptal infarct, old Borderline T abnormalities,  inferior leads new TWI in septal leads Confirmed by [facility] MD, Rusell Meneely  [phone]) on 06/20/2015 8:06:47 AM        Merrily Pew, MD 06/20/15 TD:[id number]

## 2015-06-20 NOTE — Consult Note (Signed)
CARDIOLOGY CONSULT NOTE   Patient ID: Trevor Branch MRN: [id number], DOB/AGE: 09/14/1945 70 y.o. Date of Encounter: 06/20/2015  Primary Physician: Orpah Melter, MD Primary Cardiologist: Dr Lovena Le, 2007  Chief Complaint:  Chest pain, abnl ez  HPI: Trevor Branch is a 70 y.o. male with a history of GERD, SVT w/ ablation,   He has notice increasing DOE for months. He got to the point he could not walk the dog without getting SOB. Symptoms relieved by rest.  Yesterday, he was kayaking and moving the kayak, and had chest pain. The initial symptoms were relieved by rest. However, they came back, without exertion and did not resolve. It was > 5/10. He was SOB, no N&V or diaphoresis.  He took a Xanax, no change. He took Tylenol, no change. Then took Zantac 150 mg, still no help. He was unable to sleep, the pain radiated to both elbows. He felt light-headed, and weak.   He called EMS, was given ASA x 4 and SL NTG x 1. In the ER, he got NTG paste and his symptoms resolved. They have not returned.   He had never had chest pain before. Eats healthy. Likes to exercise. No palpitations since his ablation 2009.   Past Medical History  Diagnosis Date  . GERD (gastroesophageal reflux disease)   . SVT (supraventricular tachycardia) ([place])   . PTSD (post-traumatic stress disorder)     panic attacks    Surgical History:  Past Surgical History  Procedure Laterality Date  . Supraventricular tachycardia ablation  2009    at [facility]     I have reviewed the patient's current medications. Prior to Admission medications   Medication Sig Start Date End Date Taking? Authorizing Provider  ALPRAZolam Duanne Moron) 0.5 MG tablet Take 0.5 mg by mouth as needed for anxiety.   Yes Historical Provider, MD  latanoprost (XALATAN) 0.005 % ophthalmic solution Place 1 drop into both eyes daily.   Yes Historical Provider, MD  omeprazole (PRILOSEC) 20 MG capsule Take 20 mg by mouth 2 (two) times daily before a meal.    Yes Historical Provider, MD  timolol (TIMOPTIC) 0.5 % ophthalmic solution Place 1 drop into both eyes daily.   Yes Historical Provider, MD   Scheduled Meds: . latanoprost  1 drop Both Eyes Daily  . pantoprazole  40 mg Oral Daily  . timolol  1 drop Both Eyes Daily   Continuous Infusions: . heparin 1,150 Units/hr (06/20/15 0923)   PRN Meds:.acetaminophen, ALPRAZolam, morphine injection, ondansetron (ZOFRAN) IV  Allergies:  Allergies  Allergen Reactions  . Atorvastatin     REACTION: intolerant  . Rosuvastatin     REACTION: intolerant  . Verapamil Hcl Er   . Imitrex [Sumatriptan] Palpitations    Social History   Social History  . Marital Status: Married    Spouse Name: [name]  . Number of Children: [name]  . Years of Education: [name]   Occupational History  . Retired    Social History Main Topics  . Smoking status: Never Smoker   . Smokeless tobacco: Not on file  . Alcohol Use: No  . Drug Use: No  . Sexual Activity: Not on file   Other Topics Concern  . Not on file   Social History Narrative   Active, likes to walk and kayak. Lives with wife.    Family History  Problem Relation Age of Onset  . CAD Mother   . CAD Father   . Hypertension Father   .  Diabetes type II Neg Hx    Family Status  Relation Status Death Age  . Mother Deceased   . Father Deceased     Review of Systems:   Full 14-point review of systems otherwise negative except as noted above.  Physical Exam: Blood pressure 151/87, pulse 64, temperature 97.6 F (36.4 C), temperature source Oral, resp. rate 19, height 6\' 1"  (1.854 m), weight 185 lb (83.915 kg), SpO2 97 %. General: Well developed, well nourished,male in no acute distress. Head: Normocephalic, atraumatic, sclera non-icteric, no xanthomas, nares are without discharge. Dentition: good Neck: No carotid bruits. JVD not elevated. No thyromegally Lungs: Good expansion bilaterally. without wheezes or rhonchi.  Heart: Regular rate and rhythm with  S1 S2.  No S3 or S4.  No murmur, no rubs, or gallops appreciated. Abdomen: Soft, non-tender, non-distended with normoactive bowel sounds. No hepatomegaly. No rebound/guarding. No obvious abdominal masses. Msk:  Strength and tone appear normal for age. No joint deformities or effusions, no spine or costo-vertebral angle tenderness. Extremities: No clubbing or cyanosis. No edema.  Distal pedal pulses are 2+ in 4 extrem Neuro: Alert and oriented X 3. Moves all extremities spontaneously. No focal deficits noted. Psych:  Responds to questions appropriately with a normal affect. Skin: No rashes or lesions noted  Labs:   Lab Results  Component Value Date   WBC 4.1 06/20/2015   HGB 12.9* 06/20/2015   HCT 38.1* 06/20/2015   MCV 92.9 06/20/2015   PLT 182 06/20/2015   No results for input(s): INR in the last 72 hours.   Recent Labs Lab 06/20/15 0257 06/20/15 0658  NA 142  --   K 3.8  --   CL 107  --   CO2 28  --   BUN 26*  --   CREATININE 1.26* 1.22  CALCIUM 9.1  --   GLUCOSE 102*  --     Recent Labs  06/20/15 0658  TROPONINI 0.72*    Recent Labs  06/20/15 0315  TROPIPOC 0.04   Lab Results  Component Value Date   CHOL 192 06/20/2015   HDL 44 06/20/2015   LDLCALC 134* 06/20/2015   TRIG 71 06/20/2015   Radiology/Studies: Dg Chest 2 View 06/20/2015  CLINICAL DATA:  Chest pain.  History of bronchitis EXAM: CHEST  2 VIEW COMPARISON:  12/09/2006 FINDINGS: Normal heart size and mediastinal contours. No acute infiltrate or edema. No effusion or pneumothorax. No acute osseous findings. IMPRESSION: No active cardiopulmonary disease. Electronically Signed   By: Monte Fantasia M.D.   On: 06/20/2015 04:10   ECG: 06/20/2015 SR, HR 50 (normal for him) Inferior and lateral T wave changes  ASSESSMENT AND PLAN:  NSTEMI - Agree with admit, heparin, ASA - will add NTG paste - BB not started, HR can be in the 40s at baseline - statin - cath Monday  Otherwise, per IM Active  Problems:   Essential hypertension   Chest pain   Bradycardia   Glaucoma   Elevated troponin   Signed, Rosaria Ferries, PA-C 06/20/2015 10:20 AM Beeper [id number]  Attending Note:   The patient was seen and examined.  Agree with assessment and plan as noted above.  Changes made to the above note as needed.   1. Unstable angina:/Non-ST segment elevation myocardial infarction: Patient has a strong family history of coronary artery disease. Both parents had coronary artery bypass grafting. Now presents with several weeks of intermittent exertional chest discomfort. These episodes of chest discomfort have gradually worsened. He now has severe  chest discomfort that brought him into the hospital. The chest pain has been occurring with less and less exercise recent. The chest pain radiates into his on it's described as a pressure. It also was associated with some shortness of breath. He presented to the emergency room. His troponin levels are 0.72.  His ECG shows T-wave inversions in the anterior lead suggestive of anterior ischemia.  We'll place him on heparin. He is on Nitropaste. We'll schedule him for cardiac catheterization on Monday. He currently is pain-free and I do not think that he needs urgent catheter station right now.  We discussed the risks, benefits, and options concerning heart catheterization.  He understands and agrees to proceed.   Thayer Headings, Brooke Bonito., MD, [facility] 06/20/2015, 11:13 AM [address],  [place] Pager [phone]

## 2015-06-20 NOTE — ED Notes (Signed)
Cardiology at bedside.

## 2015-06-20 NOTE — Progress Notes (Signed)
ANTICOAGULATION CONSULT NOTE - Follow Up Consult  Pharmacy Consult for heparin Indication: ACS   Labs:  Recent Labs  06/20/15 0257 06/20/15 0658 06/20/15 1621 06/20/15 2218  HGB 13.1 12.9*  --   --   HCT 40.0 38.1*  --   --   PLT 182 182  --   --   LABPROT  --   --   --  13.7  INR  --   --   --  1.03  HEPARINUNFRC  --   --  0.47 0.51  CREATININE 1.26* 1.22  --   --   TROPONINI  --  0.72* 0.77*  0.76*  --      Assessment/Plan:  70yo male remains therapeutic on heparin. Will continue gtt at current rate and confirm stable with am labs.   Wynona Neat, PharmD, BCPS  06/20/2015,11:03 PM

## 2015-06-20 NOTE — ED Notes (Signed)
Attempted report 

## 2015-06-20 NOTE — H&P (Signed)
PCP:  Trevor Melter, MD  Pulmonology [place] Referring provider Sabra Heck   Chief Complaint:   Chest pain  HPI: Trevor Branch is a 70 y.o. male   has a past medical history of GERD (gastroesophageal reflux disease).   Presented with  Chest pain started 3/10 radiating to both arms as well as jaw lasting for the past 4 hours he was resting at the time  They pain would not improve with rest. He went kayaking [place] and started to have mild chest pain while tieing things up but not paddling. Reports occasionally gets short of breath when he tries to jog and gets mild discomfort in his chest.  2 weeks ago he had an echo done to evaluate this and it was reportedly normal.  He used to be a runner and reports hx of bradycardia. Describes pain as pressure-like No associated nausea or vomiting EMS administered 1 nitroglycerin and aspirin 324 mg patient states that he have had a echogram in 2016 which was normal. NO blood in stool no easy bleeding  IN ER: Chest pain finally re-resolved after having nitroglycerin ointment placed Troponin 0.04 EKGs are not acute changes crying was noted be 1.26 at baseline fluctuates between 1.1 and 1.2   Regarding pertinent past history: Patient had a May view scan done in March 2012 showed no evidence of ischemia He has been diagnosed in 2012 with SVT as well as a right bundle branch block Hospitalist was called for admission for chest pain evaluation  Review of Systems:    Pertinent positives include: chest pain,  dyspnea on exertion,   Constitutional:  No weight loss, night sweats, Fevers, chills, fatigue, weight loss  HEENT:  No headaches, Difficulty swallowing,Tooth/dental problems,Sore throat,  No sneezing, itching, ear ache, nasal congestion, post nasal drip,  Cardio-vascular:  No Orthopnea, PND, anasarca, dizziness, palpitations.no Bilateral lower extremity swelling  GI:  No heartburn, indigestion, abdominal pain, nausea, vomiting,  diarrhea, change in bowel habits, loss of appetite, melena, blood in stool, hematemesis Resp:  no shortness of breath at rest. NoNo excess mucus, no productive cough, No non-productive cough, No coughing up of blood.No change in color of mucus.No wheezing. Skin:  no rash or lesions. No jaundice GU:  no dysuria, change in color of urine, no urgency or frequency. No straining to urinate.  No flank pain.  Musculoskeletal:  No joint pain or no joint swelling. No decreased range of motion. No back pain.  Psych:  No change in mood or affect. No depression or anxiety. No memory loss.  Neuro: no localizing neurological complaints, no tingling, no weakness, no double vision, no gait abnormality, no slurred speech, no confusion  Otherwise ROS are negative except for above, 10 systems were reviewed  Past Medical History: Past Medical History  Diagnosis Date  . GERD (gastroesophageal reflux disease)    Past Surgical History  Procedure Laterality Date  . Ablation  2009     Medications: Prior to Admission medications   Not on File    Allergies:   Allergies  Allergen Reactions  . Atorvastatin     REACTION: intolerant  . Midazolam Hcl     REACTION: reaction to Versed in past  . Rosuvastatin     REACTION: intolerant  . Verapamil     REACTION: intolerant per pt    Social History:  Ambulatory   independently   Lives at home With family     reports that he has never smoked. He does not  have any smokeless tobacco history on file. He reports that he does not drink alcohol or use illicit drugs.     Family History: family history includes CAD in his father and mother; Hypertension in his father. There is no history of Diabetes type II.    Physical Exam: Patient Vitals for the past 24 hrs:  BP Temp Temp src Pulse Resp SpO2 Height Weight  06/20/15 0345 145/78 mmHg - - (!) 52 14 98 % - -  06/20/15 0315 136/83 mmHg - - (!) 57 16 100 % - -  06/20/15 0303 - - - - - 100 % - -    06/20/15 0300 139/83 mmHg - - (!) 58 12 98 % - -  06/20/15 0259 138/82 mmHg 97.6 F (36.4 C) Oral 60 10 100 % 6\' 1"  (1.854 m) 83.915 kg (185 lb)    1. General:  in No Acute distress 2. Psychological: Alert and  Oriented 3. Head/ENT:   Moist   Mucous Membranes                          Head Non traumatic, neck supple                          Normal   Dentition 4. SKIN: normal   Skin turgor,  Skin clean Dry and intact no rash 5. Heart: Regular rate and rhythm no  Murmur, Rub or gallop 6. Lungs:  Clear to auscultation bilaterally, no wheezes or crackles   7. Abdomen: Soft, non-tender, Non distended 8. Lower extremities: no clubbing, cyanosis, or edema 9. Neurologically Grossly intact, moving all 4 extremities equally 10. MSK: Normal range of motion  body mass index is 24.41 kg/(m^2).   Labs on Admission:   Results for orders placed or performed during the hospital encounter of 06/20/15 (from the past 24 hour(s))  CBC     Status: None   Collection Time: 06/20/15  2:57 AM  Result Value Ref Range   WBC 4.5 4.0 - 10.5 K/uL   RBC 4.25 4.22 - 5.81 MIL/uL   Hemoglobin 13.1 13.0 - 17.0 g/dL   HCT 40.0 39.0 - 52.0 %   MCV 94.1 78.0 - 100.0 fL   MCH 30.8 26.0 - 34.0 pg   MCHC 32.8 30.0 - 36.0 g/dL   RDW 13.2 11.5 - 15.5 %   Platelets 182 150 - 400 K/uL  I-stat troponin, ED (not at [facility])     Status: None   Collection Time: 06/20/15  3:15 AM  Result Value Ref Range   Troponin i, poc 0.04 0.00 - 0.08 ng/mL   Comment 3            UA not obtained  No results found for: HGBA1C  CrCl cannot be calculated (Patient has no serum creatinine result on file.).  BNP (last 3 results) No results for input(s): PROBNP in the last 8760 hours.  Other results:  I have pearsonaly reviewed this: ECG REPORT  Rate: 55  Rhythm: Normal sinus rhythm ST&T Change: No ischemic changes QTC 395  Filed Weights   06/20/15 0259  Weight: 83.915 kg (185 lb)     Cultures: No results found for:  SDES, SPECREQUEST, CULT, REPTSTATUS   Radiological Exams on Admission: No results found.  Chart has been reviewed  Family   at  Bedside  plan of care was discussed with     Wife [name] (  [id number]  Assessment/Plan  69 year old gentleman with history of essential hypertension presents with somewhat atypical chest pain after heavy physical activity normal troponin EKG without acute changes currently chest pain-free  Present on Admission:  . Chest pain -  given risk factors will admit, monitor on telemetry, cycle cardiac enzymes, obtain serial ECG. Further risk stratify with lipid panel, hgA1C, obtain TSH. Make sure patient is on Aspirin. Further treatment based on the currently pending results. Reports having Echo gram 2 weeks ago which was normal will hold off on reordering for now . Essential hypertension stable continue home medication Glaucoma - continue home medications Bradycardia - per patient chronic Prophylaxis:   Lovenox   CODE STATUS:  FULL CODE  as per patient    Disposition:   To home once workup is complete and patient is stable  Other plan as per orders.  I have spent a total of 54 min on this admission  Arch Methot 06/20/2015, 5:30 AM    [facility]  Pager [phone]   after 2 AM please page floor coverage PA If 7AM-7PM, please contact the day team taking care of the patient  [email or web address]  Password [email or web address]

## 2015-06-20 NOTE — Progress Notes (Signed)
Patient seen and evaluated earlier the same by my associate. Case was discussed with cardiology this morning given elevation in troponin level. Subsequently recommendations were made to place on heparin. Patient may require further workup based on my conversation with cardiology including consideration for cath.  Patient has no new complaints currently  Gen: pt in nad, alert and awake Cv: s1 and s2 wnl, no rubs Pulm: no increased wob, no wheezes  Will reassess next am  [place], Linward Foster

## 2015-06-20 NOTE — ED Provider Notes (Signed)
CSN: [id number]     Arrival date & time 06/20/15  0244 History  By signing my name below, I, Rowan Blase, attest that this documentation has been prepared under the direction and in the presence of Noemi Chapel, MD . Electronically Signed: Rowan Blase, Scribe. 06/20/2015. 3:57 AM.   Chief Complaint  Patient presents with  . Chest Pain   The history is provided by the patient. No language interpreter was used.   HPI Comments:  Trevor Branch is a 70 y.o. male with PMHx of GERD brought in by EMS who presents to the Emergency Department complaining of moderate, worsening "pressure-like" chest pain onset yesterday, radiating to Bl shoulders and arms and neck. He notes he is currently feeling better but still experiencing some chest pressure. Pt reports associated SOB for some time. Pt took Xanex, 2 Tylenol, and 150 Zantac at home with no relief. Pt received NTG, aspirin, and oxygen per EMS with moderate symptom relief. He reports 2 episodes of bronchitis since July 2016. PCP ordered chest x-ray and echo with normal results. [place]  Past Medical History  Diagnosis Date  . GERD (gastroesophageal reflux disease)    Past Surgical History  Procedure Laterality Date  . Ablation  2009   History reviewed. No pertinent family history. Social History  Substance Use Topics  . Smoking status: Never Smoker   . Smokeless tobacco: None  . Alcohol Use: No    Review of Systems  Respiratory: Positive for shortness of breath.   Cardiovascular: Positive for chest pain.  Musculoskeletal: Positive for arthralgias.  All other systems reviewed and are negative.  Allergies  Atorvastatin; Midazolam hcl; Rosuvastatin; and Verapamil  Home Medications   Prior to Admission medications   Not on File   BP 145/78 mmHg  Pulse 52  Temp(Src) 97.6 F (36.4 C) (Oral)  Resp 14  Ht 6\' 1"  (1.854 m)  Wt 185 lb (83.915 kg)  BMI 24.41 kg/m2  SpO2 98% Physical Exam   Constitutional: He appears well-developed and well-nourished. No distress.  HENT:  Head: Normocephalic and atraumatic.  Mouth/Throat: Oropharynx is clear and moist. No oropharyngeal exudate.  Eyes: Conjunctivae and EOM are normal. Pupils are equal, round, and reactive to light. Right eye exhibits no discharge. Left eye exhibits no discharge. No scleral icterus.  Neck: Normal range of motion. Neck supple. No JVD present. No thyromegaly present.  Cardiovascular: Normal rate, regular rhythm, normal heart sounds and intact distal pulses.  Exam reveals no gallop and no friction rub.   No murmur heard. Pulmonary/Chest: Effort normal and breath sounds normal. No respiratory distress. He has no wheezes. He has no rales.  Abdominal: Soft. Bowel sounds are normal. He exhibits no distension and no mass. There is no tenderness.  Musculoskeletal: Normal range of motion. He exhibits no edema or tenderness.  Lymphadenopathy:    He has no cervical adenopathy.  Neurological: He is alert. Coordination normal.  Skin: Skin is warm and dry. No rash noted. No erythema.  Psychiatric: He has a normal mood and affect. His behavior is normal.  Nursing note and vitals reviewed.   ED Course  Procedures  DIAGNOSTIC STUDIES:  Oxygen Saturation is 100% on RA, normal by my interpretation.    COORDINATION OF CARE:  3:54 AM  Discussed treatment plan with pt at bedside and pt agreed to plan.  Labs Review Labs Reviewed  CBC  BASIC METABOLIC PANEL  I-STAT [place], ED    Imaging Review No  results found. I have personally reviewed and evaluated these images and lab results as part of my medical decision-making.   EKG Interpretation   Date/Time:  Saturday June 20 2015 03:06:12 EST Ventricular Rate:  55 PR Interval:  189 QRS Duration: 117 QT Interval:  415 QTC Calculation: 397 R Axis:   14 Text Interpretation:  Incomplete analysis due to missing data in  precordial lead(s) Sinus rhythm Nonspecific  intraventricular conduction  delay Probable anteroseptal infarct, old Since last tracing rate slower  Confirmed by Darrall Strey  MD, Dusti Tetro ([id number]) on 06/20/2015 3:59:29 AM      MDM   Final diagnoses:  None   I personally performed the services described in this documentation, which was scribed in my presence. The recorded information has been reviewed and is accurate.    D/w Dr. Roel Cluck who will admit The patient has had what sounds like unstable angina, his symptoms have almost totally resolved after getting nitroglycerin and aspirin though they lasted for multiple hours. There is no objective findings of ischemia including no elevation in the troponin and no EKG ischemic findings. He will need to be admitted for rule out.    Noemi Chapel, MD 06/20/15 [phone]

## 2015-06-21 DIAGNOSIS — R001 Bradycardia, unspecified: Secondary | ICD-10-CM

## 2015-06-21 DIAGNOSIS — I209 Angina pectoris, unspecified: Secondary | ICD-10-CM

## 2015-06-21 DIAGNOSIS — I214 Non-ST elevation (NSTEMI) myocardial infarction: Secondary | ICD-10-CM | POA: Diagnosis not present

## 2015-06-21 DIAGNOSIS — R079 Chest pain, unspecified: Secondary | ICD-10-CM | POA: Diagnosis not present

## 2015-06-21 DIAGNOSIS — H409 Unspecified glaucoma: Secondary | ICD-10-CM | POA: Diagnosis not present

## 2015-06-21 DIAGNOSIS — I10 Essential (primary) hypertension: Secondary | ICD-10-CM | POA: Diagnosis not present

## 2015-06-21 LAB — CBC
HEMATOCRIT: 40.2 % (ref 39.0–52.0)
Hemoglobin: 13.1 g/dL (ref 13.0–17.0)
MCH: 30.7 pg (ref 26.0–34.0)
MCHC: 32.6 g/dL (ref 30.0–36.0)
MCV: 94.1 fL (ref 78.0–100.0)
PLATELETS: 172 10*3/uL (ref 150–400)
RBC: 4.27 MIL/uL (ref 4.22–5.81)
RDW: 13.3 % (ref 11.5–15.5)
WBC: 5.5 10*3/uL (ref 4.0–10.5)

## 2015-06-21 LAB — HEPARIN LEVEL (UNFRACTIONATED): HEPARIN UNFRACTIONATED: 0.47 [IU]/mL (ref 0.30–0.70)

## 2015-06-21 MED ORDER — ASPIRIN 81 MG PO CHEW: 324.0000 mg | CHEWABLE_TABLET | ORAL | Status: DC

## 2015-06-21 MED ORDER — SODIUM CHLORIDE 0.9% FLUSH: 3.0000 mL | INTRAVENOUS | Status: DC | PRN

## 2015-06-21 MED ORDER — SODIUM CHLORIDE 0.9% FLUSH: 3.0000 mL | Freq: Two times a day (BID) | INTRAVENOUS | Status: DC

## 2015-06-21 MED ORDER — ALUM & MAG HYDROXIDE-SIMETH 200-200-20 MG/5ML PO SUSP
15.0000 mL | Freq: Four times a day (QID) | ORAL | Status: DC | PRN
  Administered 2015-06-21 – 2015-06-22 (×2): 15 mL via ORAL
  Filled 2015-06-21 (×2): qty 30

## 2015-06-21 MED ORDER — SODIUM CHLORIDE 0.9 % IV SOLN: 250.0000 mL | INTRAVENOUS | Status: DC | PRN

## 2015-06-21 MED ORDER — SODIUM CHLORIDE 0.9 % WEIGHT BASED INFUSION
3.0000 mL/kg/h | INTRAVENOUS | Status: DC
Start: 2015-06-22 — End: 2015-06-21

## 2015-06-21 MED ORDER — ASPIRIN 81 MG PO CHEW
81.0000 mg | CHEWABLE_TABLET | ORAL | Status: AC
  Administered 2015-06-22: 81 mg via ORAL
  Filled 2015-06-21: qty 1

## 2015-06-21 MED ORDER — SODIUM CHLORIDE 0.9 % WEIGHT BASED INFUSION: 1.0000 mL/kg/h | INTRAVENOUS | Status: DC

## 2015-06-21 MED ORDER — PANTOPRAZOLE SODIUM 40 MG PO TBEC
40.0000 mg | DELAYED_RELEASE_TABLET | Freq: Every day | ORAL | Status: DC
  Administered 2015-06-21 – 2015-06-25 (×4): 40 mg via ORAL
  Filled 2015-06-21 (×3): qty 1

## 2015-06-21 MED ORDER — ASPIRIN 81 MG PO CHEW
81.0000 mg | CHEWABLE_TABLET | Freq: Every day | ORAL | Status: DC
  Administered 2015-06-21: 81 mg via ORAL
  Filled 2015-06-21: qty 1

## 2015-06-21 MED ORDER — SODIUM CHLORIDE 0.9% FLUSH
3.0000 mL | Freq: Two times a day (BID) | INTRAVENOUS | Status: DC
  Administered 2015-06-21: 3 mL via INTRAVENOUS

## 2015-06-21 MED ORDER — SODIUM CHLORIDE 0.9 % WEIGHT BASED INFUSION
3.0000 mL/kg/h | INTRAVENOUS | Status: DC
  Administered 2015-06-22: 3 mL/kg/h via INTRAVENOUS

## 2015-06-21 MED ORDER — SODIUM CHLORIDE 0.9 % WEIGHT BASED INFUSION
1.0000 mL/kg/h | INTRAVENOUS | Status: DC
Start: 2015-06-22 — End: 2015-06-22

## 2015-06-21 NOTE — Progress Notes (Signed)
[facility] PROGRESS NOTE  Trevor Branch [id number] DOB: 09/12/1945 DOA: 06/20/2015 PCP: Orpah Melter, MD  Assessment/Plan: Active Problems:   Chest pain/ Elevated troponin -Cardiology consulted. Concern is for MI. Currently on heparin - Plan is for next a.m. Discuss case with cardiology who will take over medical management given principal problem  GERD - Administered Mylanta today - Educated patient on how to properly take PPIs. Place order to have Protonix administered earlier in the morning and ideally 30 minutes prior to eating any food    Glaucoma - stable continue home medication regimen     Code Status: Full Family Communication: Discussed directly with patient Disposition Plan: Pending further recommendations from cardiology   Consultants:  Cardiology  Procedures:  None  Antibiotics:  None  HPI/Subjective: Patient has no new complaints. No acute issues overnight. This morning was complaining about reflux which improved after Mylanta administration  Objective: Filed Vitals:   06/21/15 0317 06/21/15 0722  BP: 105/74 109/61  Pulse: 72   Temp: 98.3 F (36.8 C) 98.7 F (37.1 C)  Resp: 17 19    Intake/Output Summary (Last 24 hours) at 06/21/15 1110 Last data filed at 06/21/15 0900  Gross per 24 hour  Intake  991.6 ml  Output    700 ml  Net  291.6 ml   Filed Weights   06/20/15 0259  Weight: 83.915 kg (185 lb)    Exam:   General:  Patient in no acute distress, alert and awake  Cardiovascular: Regular rate and rhythm, no murmurs or rubs  Respiratory: No increased work of breathing, no wheezes  Abdomen: Soft, nondistended  Musculoskeletal: No cyanosis, equal tone   Data Reviewed: Basic Metabolic Panel:  Recent Labs Lab 06/20/15 0257 06/20/15 0658  NA 142  --   K 3.8  --   CL 107  --   CO2 28  --   GLUCOSE 102*  --   BUN 26*  --   CREATININE 1.26* 1.22  CALCIUM 9.1  --    Liver Function Tests: No results for  input(s): AST, ALT, ALKPHOS, BILITOT, PROT, ALBUMIN in the last 168 hours. No results for input(s): LIPASE, AMYLASE in the last 168 hours. No results for input(s): AMMONIA in the last 168 hours. CBC:  Recent Labs Lab 06/20/15 0257 06/20/15 0658 06/21/15 0450  WBC 4.5 4.1 5.5  HGB 13.1 12.9* 13.1  HCT 40.0 38.1* 40.2  MCV 94.1 92.9 94.1  PLT 182 182 172   Cardiac Enzymes:  Recent Labs Lab 06/20/15 0658 06/20/15 1621  TROPONINI 0.72* 0.77*  0.76*   BNP (last 3 results) No results for input(s): BNP in the last 8760 hours.  ProBNP (last 3 results) No results for input(s): PROBNP in the last 8760 hours.  CBG:  Recent Labs Lab 06/20/15 1647  GLUCAP 99    Recent Results (from the past 240 hour(s))  MRSA PCR Screening     Status: None   Collection Time: 06/20/15  1:54 PM  Result Value Ref Range Status   MRSA by PCR NEGATIVE NEGATIVE Final    Comment:        The GeneXpert MRSA Assay (FDA approved for NASAL specimens only), is one component of a comprehensive MRSA colonization surveillance program. It is not intended to diagnose MRSA infection nor to guide or monitor treatment for MRSA infections.      Studies: Dg Chest 2 View  06/20/2015  CLINICAL DATA:  Chest pain.  History of bronchitis EXAM: CHEST  2 VIEW  COMPARISON:  12/09/2006 FINDINGS: Normal heart size and mediastinal contours. No acute infiltrate or edema. No effusion or pneumothorax. No acute osseous findings. IMPRESSION: No active cardiopulmonary disease. Electronically Signed   By: Monte Fantasia M.D.   On: 06/20/2015 04:10    Scheduled Meds: . latanoprost  1 drop Both Eyes Daily  . pantoprazole  40 mg Oral Daily  . timolol  1 drop Both Eyes Daily   Continuous Infusions: . heparin 1,150 Units/hr (06/20/15 2330)    Time spent: 20 minutes    Velvet Bathe  [facility] Pager [phone] If 7PM-7AM, please contact night-coverage at [email or web address], password [facility] 06/21/2015, 11:10 AM  LOS: 1  day

## 2015-06-21 NOTE — Progress Notes (Signed)
PROGRESS NOTE  Subjective:    70 y.o. male with a history of GERD, SVT w/ ablation Admitted with chest pain and + troponin levels Is pain free now.  Plan is for cath on Monday  Troponin levels are relatively stable at 0.77    Objective:    Vital Signs:   Temp:  [98.1 F (36.7 C)-98.7 F (37.1 C)] 98.7 F (37.1 C) (03/12 0722) Pulse Rate:  [13-74] 72 (03/12 0317) Resp:  [11-23] 19 (03/12 0722) BP: (105-140)/(60-86) 109/61 mmHg (03/12 0722) SpO2:  [94 %-99 %] 94 % (03/12 0722)  Last BM Date: 06/19/15   24-hour weight change: Weight change:   Weight trends: Filed Weights   06/20/15 0259  Weight: 185 lb (83.915 kg)    Intake/Output:  03/11 0701 - 03/12 0700 In: 848.6 [P.O.:600; I.V.:248.6] Out: 475 [Urine:475] Total I/O In: 143 [P.O.:120; I.V.:23] Out: 225 [Urine:225]   Physical Exam: BP 109/61 mmHg  Pulse 72  Temp(Src) 98.7 F (37.1 C) (Oral)  Resp 19  Ht 6\' 1"  (1.854 m)  Wt 185 lb (83.915 kg)  BMI 24.41 kg/m2  SpO2 94%  Wt Readings from Last 3 Encounters:  06/20/15 185 lb (83.915 kg)  10/02/07 191 lb 8 oz (86.864 kg)  09/11/07 190 lb 8 oz (86.41 kg)    General: Vital signs reviewed and noted.   Head: Normocephalic, atraumatic.  Eyes: conjunctivae/corneas clear.  EOM's intact.   Throat: normal  Neck:  normal   Lungs:      Heart:  RR , normal S1S2  Abdomen:  Soft, non-tender, non-distended    Extremities: Good pulses.    Neurologic: A&O X3, CN II - XII are grossly intact.   Psych: Normal     Labs: BMET:  Recent Labs  06/20/15 0257 06/20/15 0658  NA 142  --   K 3.8  --   CL 107  --   CO2 28  --   GLUCOSE 102*  --   BUN 26*  --   CREATININE 1.26* 1.22  CALCIUM 9.1  --     Liver function tests: No results for input(s): AST, ALT, ALKPHOS, BILITOT, PROT, ALBUMIN in the last 72 hours. No results for input(s): LIPASE, AMYLASE in the last 72 hours.  CBC:  Recent Labs  06/20/15 0658 06/21/15 0450  WBC 4.1 5.5  HGB  12.9* 13.1  HCT 38.1* 40.2  MCV 92.9 94.1  PLT 182 172    Cardiac Enzymes:  Recent Labs  06/20/15 0658 06/20/15 1621  TROPONINI 0.72* 0.77*  0.76*    Coagulation Studies:  Recent Labs  06/20/15 2218  LABPROT 13.7  INR 1.03    Other: Invalid input(s): POCBNP No results for input(s): DDIMER in the last 72 hours. No results for input(s): HGBA1C in the last 72 hours.  Recent Labs  06/20/15 0658  CHOL 192  HDL 44  LDLCALC 134*  TRIG 71  CHOLHDL 4.4    Recent Labs  06/20/15 0658  TSH 2.260   No results for input(s): VITAMINB12, FOLATE, FERRITIN, TIBC, IRON, RETICCTPCT in the last 72 hours.   Other results:  EKG  ( personally reviewed )  -  NSR ,  deept T wave inversion in anterior leads   Medications:    Infusions: . heparin 1,150 Units/hr (06/20/15 2330)    Scheduled Medications: . latanoprost  1 drop Both Eyes Daily  . pantoprazole  40 mg Oral Daily  . timolol  1 drop Both Eyes Daily  Assessment/ Plan:   Active Problems:   Essential hypertension   Chest pain   Bradycardia   Glaucoma   Elevated troponin  1. CAD - presented with chest pain .   + troponins and progressive TWI in the anterior leads.   Very suspicious for ant. Ischemia. For cath tomorrow. Have discussed risks, benefits, optioins. He understands and agrees to proceed.   Discussed with Dr. Wendee Beavers.   Will transfer him to our service since his problems seem to be entirely cardiac at this time   Disposition: cath tomorrow   Length of Stay: 1  Thayer Headings, Brooke Bonito., MD, [facility] 06/21/2015, 10:52 AM Office [phone] Pager [phone]

## 2015-06-21 NOTE — Progress Notes (Signed)
ANTICOAGULATION CONSULT NOTE - Follow Up Consult  Patient's a 70 y.o M admitted with c/o CP and heparin started for ACS.  Plan is for cardiac cath on Monday 3/13.  HL 0.47 (therapeutic x2), hgb 13.1, plt wnl  Goal of Therapy:  Heparin level 0.3-0.7 units/ml Monitor platelets by anticoagulation protocol: Yes   Plan: Continue current rate of 1150 units/hr Daily HL/CBC F/u plans for cath   Darl Pikes, PharmD Clinical Pharmacist- Resident Pager: [phone]   06/21/2015 8:09 AM

## 2015-06-22 ENCOUNTER — Encounter (HOSPITAL_COMMUNITY)
Admission: EM | Disposition: A | Discharge status: Home / Self Care | Payer: Self-pay | Source: Home / Self Care | Attending: Cardiovascular Disease

## 2015-06-22 DIAGNOSIS — I2510 Atherosclerotic heart disease of native coronary artery without angina pectoris: Secondary | ICD-10-CM

## 2015-06-22 DIAGNOSIS — I214 Non-ST elevation (NSTEMI) myocardial infarction: Secondary | ICD-10-CM | POA: Insufficient documentation

## 2015-06-22 DIAGNOSIS — I10 Essential (primary) hypertension: Secondary | ICD-10-CM | POA: Diagnosis not present

## 2015-06-22 DIAGNOSIS — R071 Chest pain on breathing: Secondary | ICD-10-CM | POA: Diagnosis not present

## 2015-06-22 DIAGNOSIS — R001 Bradycardia, unspecified: Secondary | ICD-10-CM | POA: Diagnosis not present

## 2015-06-22 DIAGNOSIS — H409 Unspecified glaucoma: Secondary | ICD-10-CM | POA: Diagnosis not present

## 2015-06-22 HISTORY — PX: CARDIAC CATHETERIZATION: SHX172

## 2015-06-22 LAB — CBC
HCT: 40.5 % (ref 39.0–52.0)
Hemoglobin: 13.1 g/dL (ref 13.0–17.0)
MCH: 30.5 pg (ref 26.0–34.0)
MCHC: 32.3 g/dL (ref 30.0–36.0)
MCV: 94.2 fL (ref 78.0–100.0)
PLATELETS: 162 10*3/uL (ref 150–400)
RBC: 4.3 MIL/uL (ref 4.22–5.81)
RDW: 13.3 % (ref 11.5–15.5)
WBC: 6.1 10*3/uL (ref 4.0–10.5)

## 2015-06-22 LAB — POCT ACTIVATED CLOTTING TIME
ACTIVATED CLOTTING TIME: 291 s
Activated Clotting Time: 296 seconds

## 2015-06-22 LAB — PLATELET COUNT: PLATELETS: 170 10*3/uL (ref 150–400)

## 2015-06-22 LAB — HEMOGLOBIN A1C
HEMOGLOBIN A1C: 5.7 % — AB (ref 4.8–5.6)
Mean Plasma Glucose: 117 mg/dL

## 2015-06-22 LAB — HEPARIN LEVEL (UNFRACTIONATED): Heparin Unfractionated: 0.47 IU/mL (ref 0.30–0.70)

## 2015-06-22 SURGERY — LEFT HEART CATH AND CORONARY ANGIOGRAPHY
Anesthesia: LOCAL

## 2015-06-22 MED ORDER — FENTANYL CITRATE (PF) 100 MCG/2ML IJ SOLN
INTRAMUSCULAR | Status: AC
  Filled 2015-06-22: qty 2

## 2015-06-22 MED ORDER — HEPARIN (PORCINE) IN NACL 2-0.9 UNIT/ML-% IJ SOLN
INTRAMUSCULAR | Status: DC | PRN
  Administered 2015-06-22: 1000 mL

## 2015-06-22 MED ORDER — ADENOSINE 6 MG/2ML IV SOLN
INTRAVENOUS | Status: AC
  Filled 2015-06-22: qty 2

## 2015-06-22 MED ORDER — MIDAZOLAM HCL 2 MG/2ML IJ SOLN
INTRAMUSCULAR | Status: DC | PRN
  Administered 2015-06-22 (×2): 1 mg via INTRAVENOUS
  Administered 2015-06-22: 2 mg via INTRAVENOUS
  Administered 2015-06-22: 1 mg via INTRAVENOUS

## 2015-06-22 MED ORDER — TICAGRELOR 90 MG PO TABS
90.0000 mg | ORAL_TABLET | Freq: Two times a day (BID) | ORAL | Status: DC
  Administered 2015-06-22 – 2015-06-24 (×4): 90 mg via ORAL
  Filled 2015-06-22 (×4): qty 1

## 2015-06-22 MED ORDER — HEPARIN SODIUM (PORCINE) 1000 UNIT/ML IJ SOLN
INTRAMUSCULAR | Status: DC | PRN
  Administered 2015-06-22: 5000 [IU] via INTRAVENOUS
  Administered 2015-06-22: 6000 [IU] via INTRAVENOUS
  Administered 2015-06-22: 3000 [IU] via INTRAVENOUS

## 2015-06-22 MED ORDER — SODIUM CHLORIDE 0.9 % IV SOLN: 250.0000 mL | INTRAVENOUS | Status: DC | PRN

## 2015-06-22 MED ORDER — ONDANSETRON HCL 4 MG/2ML IJ SOLN
4.0000 mg | Freq: Four times a day (QID) | INTRAMUSCULAR | Status: DC | PRN
Start: 2015-06-22 — End: 2015-06-24
  Administered 2015-06-22: 4 mg via INTRAVENOUS
  Filled 2015-06-22: qty 2

## 2015-06-22 MED ORDER — TICAGRELOR 90 MG PO TABS
ORAL_TABLET | ORAL | Status: AC
  Filled 2015-06-22: qty 1

## 2015-06-22 MED ORDER — MIDAZOLAM HCL 2 MG/2ML IJ SOLN
INTRAMUSCULAR | Status: AC
  Filled 2015-06-22: qty 2

## 2015-06-22 MED ORDER — HEPARIN SODIUM (PORCINE) 1000 UNIT/ML IJ SOLN
INTRAMUSCULAR | Status: AC
  Filled 2015-06-22: qty 1

## 2015-06-22 MED ORDER — IOHEXOL 350 MG/ML SOLN
INTRAVENOUS | Status: DC | PRN
  Administered 2015-06-22: 160 mL via INTRA_ARTERIAL

## 2015-06-22 MED ORDER — NITROGLYCERIN 1 MG/10 ML FOR IR/CATH LAB
INTRA_ARTERIAL | Status: DC | PRN
  Administered 2015-06-22: 200 ug via INTRACORONARY
  Administered 2015-06-22: 400 ug via INTRA_ARTERIAL
  Administered 2015-06-22: 200 ug via INTRACORONARY

## 2015-06-22 MED ORDER — ADENOSINE (DIAGNOSTIC) FOR INTRACORONARY USE
INTRAVENOUS | Status: DC | PRN
  Administered 2015-06-22 (×2): 60 ug via INTRACORONARY

## 2015-06-22 MED ORDER — ASPIRIN 81 MG PO CHEW
81.0000 mg | CHEWABLE_TABLET | Freq: Every day | ORAL | Status: DC
  Administered 2015-06-23: 81 mg via ORAL
  Filled 2015-06-22: qty 1

## 2015-06-22 MED ORDER — PROMETHAZINE HCL 25 MG/ML IJ SOLN
12.5000 mg | Freq: Four times a day (QID) | INTRAMUSCULAR | Status: DC | PRN
  Administered 2015-06-22 – 2015-06-23 (×2): 12.5 mg via INTRAVENOUS
  Filled 2015-06-22 (×2): qty 1

## 2015-06-22 MED ORDER — TIROFIBAN HCL IN NACL 5-0.9 MG/100ML-% IV SOLN
0.1500 ug/kg/min | INTRAVENOUS | Status: AC
  Administered 2015-06-22: 0.15 ug/kg/min via INTRAVENOUS
  Filled 2015-06-22: qty 100

## 2015-06-22 MED ORDER — NITROGLYCERIN IN D5W 200-5 MCG/ML-% IV SOLN
INTRAVENOUS | Status: AC
  Filled 2015-06-22: qty 250

## 2015-06-22 MED ORDER — LIDOCAINE HCL (PF) 1 % IJ SOLN
INTRAMUSCULAR | Status: DC | PRN
  Administered 2015-06-22: 2 mL

## 2015-06-22 MED ORDER — HYDROMORPHONE HCL 1 MG/ML IJ SOLN
1.0000 mg | INTRAMUSCULAR | Status: DC | PRN
  Administered 2015-06-22: 1 mg via INTRAVENOUS
  Filled 2015-06-22: qty 1

## 2015-06-22 MED ORDER — HEPARIN (PORCINE) IN NACL 2-0.9 UNIT/ML-% IJ SOLN
INTRAMUSCULAR | Status: AC
  Filled 2015-06-22: qty 1000

## 2015-06-22 MED ORDER — SODIUM CHLORIDE 0.9% FLUSH
3.0000 mL | Freq: Two times a day (BID) | INTRAVENOUS | Status: DC
  Administered 2015-06-22 – 2015-06-25 (×4): 3 mL via INTRAVENOUS

## 2015-06-22 MED ORDER — NITROGLYCERIN 0.4 MG SL SUBL
SUBLINGUAL_TABLET | SUBLINGUAL | Status: AC
  Administered 2015-06-22: 13:00:00
  Filled 2015-06-22: qty 1

## 2015-06-22 MED ORDER — ACETAMINOPHEN 325 MG PO TABS: 650.0000 mg | ORAL_TABLET | ORAL | Status: DC | PRN

## 2015-06-22 MED ORDER — TIROFIBAN HCL IN NACL 5-0.9 MG/100ML-% IV SOLN
INTRAVENOUS | Status: AC
  Filled 2015-06-22: qty 100

## 2015-06-22 MED ORDER — LIDOCAINE HCL (PF) 1 % IJ SOLN
INTRAMUSCULAR | Status: AC
  Filled 2015-06-22: qty 30

## 2015-06-22 MED ORDER — TICAGRELOR 90 MG PO TABS
ORAL_TABLET | ORAL | Status: DC | PRN
  Administered 2015-06-22: 180 mg via ORAL

## 2015-06-22 MED ORDER — NITROGLYCERIN 1 MG/10 ML FOR IR/CATH LAB
INTRA_ARTERIAL | Status: AC
  Filled 2015-06-22: qty 10

## 2015-06-22 MED ORDER — SODIUM CHLORIDE 0.9% FLUSH: 3.0000 mL | INTRAVENOUS | Status: DC | PRN

## 2015-06-22 MED ORDER — FENTANYL CITRATE (PF) 100 MCG/2ML IJ SOLN
INTRAMUSCULAR | Status: DC | PRN
  Administered 2015-06-22 (×2): 25 ug via INTRAVENOUS
  Administered 2015-06-22: 50 ug via INTRAVENOUS
  Administered 2015-06-22: 25 ug via INTRAVENOUS
  Administered 2015-06-22: 50 ug via INTRAVENOUS

## 2015-06-22 MED ORDER — TIROFIBAN (AGGRASTAT) BOLUS VIA INFUSION
INTRAVENOUS | Status: DC | PRN
  Administered 2015-06-22: 2150 ug via INTRAVENOUS

## 2015-06-22 MED ORDER — TIROFIBAN HCL IN NACL 5-0.9 MG/100ML-% IV SOLN
INTRAVENOUS | Status: DC | PRN
  Administered 2015-06-22: 0.15 ug/kg/min via INTRAVENOUS

## 2015-06-22 MED ORDER — SODIUM CHLORIDE 0.9 % WEIGHT BASED INFUSION: 3.0000 mL/kg/h | INTRAVENOUS | Status: AC

## 2015-06-22 SURGICAL SUPPLY — 24 items
BALLN EUPHORA RX 2.5X25 (BALLOONS) ×2
BALLN ~~LOC~~ EUPHORA RX 3.5X20 (BALLOONS) ×2
BALLN ~~LOC~~ TREK RX 2.75X15 (BALLOONS) ×2
BALLOON EUPHORA RX 2.5X25 (BALLOONS) IMPLANT
BALLOON ~~LOC~~ EUPHORA RX 3.5X20 (BALLOONS) IMPLANT
BALLOON ~~LOC~~ TREK RX 2.75X15 (BALLOONS) IMPLANT
CATH INFINITI 5 FR JL3.5 (CATHETERS) ×1 IMPLANT
CATH INFINITI JR4 5F (CATHETERS) ×1 IMPLANT
DEVICE RAD COMP TR BAND LRG (VASCULAR PRODUCTS) ×2 IMPLANT
GLIDESHEATH SLEND SS 6F .021 (SHEATH) ×1 IMPLANT
GUIDE CATH RUNWAY 6FR CLS3 (CATHETERS) ×1 IMPLANT
GUIDE CATH RUNWAY 6FR CLS3.5 (CATHETERS) ×1 IMPLANT
KIT ENCORE 26 ADVANTAGE (KITS) ×1 IMPLANT
KIT HEART LEFT (KITS) ×2 IMPLANT
PACK CARDIAC CATHETERIZATION (CUSTOM PROCEDURE TRAY) ×2 IMPLANT
STENT SYNERGY DES 2.5X38 (Permanent Stent) ×1 IMPLANT
STENT SYNERGY DES 3X16 (Permanent Stent) ×1 IMPLANT
TRANSDUCER W/STOPCOCK (MISCELLANEOUS) ×2 IMPLANT
TUBING CIL FLEX 10 FLL-RA (TUBING) ×2 IMPLANT
VALVE GUARDIAN II ~~LOC~~ HEMO (MISCELLANEOUS) ×1 IMPLANT
WIRE ASAHI FIELDER XT 190CM (WIRE) ×1 IMPLANT
WIRE ASAHI PROWATER 180CM (WIRE) ×1 IMPLANT
WIRE HI TORQ BMW 190CM (WIRE) ×1 IMPLANT
WIRE SAFE-T 1.5MM-J .035X260CM (WIRE) ×1 IMPLANT

## 2015-06-22 NOTE — Progress Notes (Signed)
PROGRESS NOTE  Subjective:    70 y.o. male with a history of GERD, SVT w/ ablation Admitted with chest pain and + troponin levels Is pain free now.  Plan is for cath on Monday  Troponin levels are relatively stable at 0.77  For cath today   Objective:    Vital Signs:   Temp:  [97.7 F (36.5 C)-98.8 F (37.1 C)] 98 F (36.7 C) (03/13 0304) Resp:  [14-20] 14 (03/13 0304) BP: (110-156)/(58-106) 112/69 mmHg (03/13 0304) SpO2:  [96 %-98 %] 96 % (03/13 0304) Weight:  [189 lb 11.2 oz (86.047 kg)] 189 lb 11.2 oz (86.047 kg) (03/13 0304)  Last BM Date: 06/21/15   24-hour weight change: Weight change:   Weight trends: Filed Weights   06/20/15 0259 06/22/15 0304  Weight: 185 lb (83.915 kg) 189 lb 11.2 oz (86.047 kg)    Intake/Output:  03/12 0701 - 03/13 0700 In: 1807.7 [P.O.:1280; I.V.:527.7] Out: 875 [Urine:875]     Physical Exam: BP 112/69 mmHg  Pulse 72  Temp(Src) 98 F (36.7 C) (Oral)  Resp 14  Ht 6\' 1"  (1.854 m)  Wt 189 lb 11.2 oz (86.047 kg)  BMI 25.03 kg/m2  SpO2 96%  Wt Readings from Last 3 Encounters:  06/22/15 189 lb 11.2 oz (86.047 kg)  10/02/07 191 lb 8 oz (86.864 kg)  09/11/07 190 lb 8 oz (86.41 kg)    General: Vital signs reviewed and noted.   Head: Normocephalic, atraumatic.  Eyes: conjunctivae/corneas clear.  EOM's intact.   Throat: normal  Neck:  normal   Lungs:      Heart:  RR , normal S1S2  Abdomen:  Soft, non-tender, non-distended    Extremities: Good pulses.    Neurologic: A&O X3, CN II - XII are grossly intact.   Psych: Normal     Labs: BMET:  Recent Labs  06/20/15 0257 06/20/15 0658  NA 142  --   K 3.8  --   CL 107  --   CO2 28  --   GLUCOSE 102*  --   BUN 26*  --   CREATININE 1.26* 1.22  CALCIUM 9.1  --     Liver function tests: No results for input(s): AST, ALT, ALKPHOS, BILITOT, PROT, ALBUMIN in the last 72 hours. No results for input(s): LIPASE, AMYLASE in the last 72 hours.  CBC:  Recent Labs  06/21/15 0450 06/22/15 0250  WBC 5.5 6.1  HGB 13.1 13.1  HCT 40.2 40.5  MCV 94.1 94.2  PLT 172 162    Cardiac Enzymes:  Recent Labs  06/20/15 0658 06/20/15 1621  TROPONINI 0.72* 0.77*  0.76*    Coagulation Studies:  Recent Labs  06/20/15 2218  LABPROT 13.7  INR 1.03    Other: Invalid input(s): POCBNP No results for input(s): DDIMER in the last 72 hours. No results for input(s): HGBA1C in the last 72 hours.  Recent Labs  06/20/15 0658  CHOL 192  HDL 44  LDLCALC 134*  TRIG 71  CHOLHDL 4.4    Recent Labs  06/20/15 0658  TSH 2.260   No results for input(s): VITAMINB12, FOLATE, FERRITIN, TIBC, IRON, RETICCTPCT in the last 72 hours.   Other results:  EKG  ( personally reviewed )  -  NSR ,  deept T wave inversion in anterior leads   Medications:    Infusions: . sodium chloride 1 mL/kg/hr (06/22/15 0700)  . heparin 1,150 Units/hr (06/21/15 2325)    Scheduled Medications: . aspirin  81 mg Oral Daily  . latanoprost  1 drop Both Eyes Daily  . pantoprazole  40 mg Oral Daily  . timolol  1 drop Both Eyes Daily    Assessment/ Plan:   Active Problems:   Essential hypertension   Chest pain   Bradycardia   Glaucoma   Elevated troponin  1. CAD - presented with chest pain .   + troponins and progressive TWI in the anterior leads.   Very suspicious for ant. Ischemia. For cath tomorrow. Have discussed risks, benefits, optioins. He understands and agrees to proceed.   Still having lots of indigestion  May be due to ischemia    Disposition: cath today . Clear liquids for breakfast then NPO afterwards.   Cath this afternoon   Length of Stay: 2  Ramond Dial., MD, [facility] 06/22/2015, 8:26 AM Office [phone] Pager [phone]

## 2015-06-22 NOTE — Progress Notes (Signed)
Patient c/o of increased stomach and chest pain that radiates over left should and in armpit. Cardiologist paged. Patient is sitting on side of bed for comfort. Will continue to monitor patient closely.

## 2015-06-22 NOTE — Interval H&P Note (Signed)
Cath Lab Visit (complete for each Cath Lab visit)  Clinical Evaluation Leading to the Procedure:   ACS: Yes.    Non-ACS:    Anginal Classification: CCS IV  Anti-ischemic medical therapy: Minimal Therapy (1 class of medications)  Non-Invasive Test Results: No non-invasive testing performed  Prior CABG: No previous CABG      History and Physical Interval Note:  06/22/2015 1:34 PM  Trevor Branch  has presented today for surgery, with the diagnosis of unstable angina  The various methods of treatment have been discussed with the patient and family. After consideration of risks, benefits and other options for treatment, the patient has consented to  Procedure(s): Left Heart Cath and Coronary Angiography ([name]) as a surgical intervention .  The patient's history has been reviewed, patient examined, no change in status, stable for surgery.  I have reviewed the patient's chart and labs.  Questions were answered to the patient's satisfaction.     Rithwik Schmieg S.

## 2015-06-22 NOTE — Progress Notes (Addendum)
ANTICOAGULATION CONSULT NOTE - Follow Up Consult  Patient's a 70 y.o M admitted with c/o CP and heparin started for ACS.  Plan is for cardiac cath today.  HL 0.47, hgb 13.1, plt wnl  Goal of Therapy:  Heparin level 0.3-0.7 units/ml Monitor platelets by anticoagulation protocol: Yes   Plan: Continue current rate of 1150 units/hr Daily HL/CBC Will follow plans post cath  Hildred Laser, Pharm D 06/22/2015 8:45 AM   Addendum -He has a history of statin intolerance which he describes as dizziness -I spoke with him about the benefits of adding a statin but he refuses to try one even at a small dose (and while observing him in the hospital)  Hildred Laser, Pharm D 06/22/2015 9:18 AM

## 2015-06-22 NOTE — H&P (View-Only) (Signed)
PROGRESS NOTE  Subjective:    70 y.o. male with a history of GERD, SVT w/ ablation Admitted with chest pain and + troponin levels Is pain free now.  Plan is for cath on Monday  Troponin levels are relatively stable at 0.77  For cath today   Objective:    Vital Signs:   Temp:  [97.7 F (36.5 C)-98.8 F (37.1 C)] 98 F (36.7 C) (03/13 0304) Resp:  [14-20] 14 (03/13 0304) BP: (110-156)/(58-106) 112/69 mmHg (03/13 0304) SpO2:  [96 %-98 %] 96 % (03/13 0304) Weight:  [189 lb 11.2 oz (86.047 kg)] 189 lb 11.2 oz (86.047 kg) (03/13 0304)  Last BM Date: 06/21/15   24-hour weight change: Weight change:   Weight trends: Filed Weights   06/20/15 0259 06/22/15 0304  Weight: 185 lb (83.915 kg) 189 lb 11.2 oz (86.047 kg)    Intake/Output:  03/12 0701 - 03/13 0700 In: 1807.7 [P.O.:1280; I.V.:527.7] Out: 875 [Urine:875]     Physical Exam: BP 112/69 mmHg  Pulse 72  Temp(Src) 98 F (36.7 C) (Oral)  Resp 14  Ht 6\' 1"  (1.854 m)  Wt 189 lb 11.2 oz (86.047 kg)  BMI 25.03 kg/m2  SpO2 96%  Wt Readings from Last 3 Encounters:  06/22/15 189 lb 11.2 oz (86.047 kg)  10/02/07 191 lb 8 oz (86.864 kg)  09/11/07 190 lb 8 oz (86.41 kg)    General: Vital signs reviewed and noted.   Head: Normocephalic, atraumatic.  Eyes: conjunctivae/corneas clear.  EOM's intact.   Throat: normal  Neck:  normal   Lungs:      Heart:  RR , normal S1S2  Abdomen:  Soft, non-tender, non-distended    Extremities: Good pulses.    Neurologic: A&O X3, CN II - XII are grossly intact.   Psych: Normal     Labs: BMET:  Recent Labs  06/20/15 0257 06/20/15 0658  NA 142  --   K 3.8  --   CL 107  --   CO2 28  --   GLUCOSE 102*  --   BUN 26*  --   CREATININE 1.26* 1.22  CALCIUM 9.1  --     Liver function tests: No results for input(s): AST, ALT, ALKPHOS, BILITOT, PROT, ALBUMIN in the last 72 hours. No results for input(s): LIPASE, AMYLASE in the last 72 hours.  CBC:  Recent Labs  06/21/15 0450 06/22/15 0250  WBC 5.5 6.1  HGB 13.1 13.1  HCT 40.2 40.5  MCV 94.1 94.2  PLT 172 162    Cardiac Enzymes:  Recent Labs  06/20/15 0658 06/20/15 1621  TROPONINI 0.72* 0.77*  0.76*    Coagulation Studies:  Recent Labs  06/20/15 2218  LABPROT 13.7  INR 1.03    Other: Invalid input(s): POCBNP No results for input(s): DDIMER in the last 72 hours. No results for input(s): HGBA1C in the last 72 hours.  Recent Labs  06/20/15 0658  CHOL 192  HDL 44  LDLCALC 134*  TRIG 71  CHOLHDL 4.4    Recent Labs  06/20/15 0658  TSH 2.260   No results for input(s): VITAMINB12, FOLATE, FERRITIN, TIBC, IRON, RETICCTPCT in the last 72 hours.   Other results:  EKG  ( personally reviewed )  -  NSR ,  deept T wave inversion in anterior leads   Medications:    Infusions: . sodium chloride 1 mL/kg/hr (06/22/15 0700)  . heparin 1,150 Units/hr (06/21/15 2325)    Scheduled Medications: . aspirin  81 mg Oral Daily  . latanoprost  1 drop Both Eyes Daily  . pantoprazole  40 mg Oral Daily  . timolol  1 drop Both Eyes Daily    Assessment/ Plan:   Active Problems:   Essential hypertension   Chest pain   Bradycardia   Glaucoma   Elevated troponin  1. CAD - presented with chest pain .   + troponins and progressive TWI in the anterior leads.   Very suspicious for ant. Ischemia. For cath tomorrow. Have discussed risks, benefits, optioins. He understands and agrees to proceed.   Still having lots of indigestion  May be due to ischemia    Disposition: cath today . Clear liquids for breakfast then NPO afterwards.   Cath this afternoon   Length of Stay: 2  Ramond Dial., MD, [facility] 06/22/2015, 8:26 AM Office [phone] Pager [phone]

## 2015-06-22 NOTE — Progress Notes (Signed)
Received order from MD for sublingual nitroglycerin via telephone.

## 2015-06-22 NOTE — Progress Notes (Addendum)
    Saw the patient due to recurrent chest discomfort.  Now back up to an 8/10.  At the end of the procedure, it was at about an 8 out of 10. It decreased to a 4 out of 10. It is now worse when he coughs or gags. He is having significant nausea. This has been going on prior to his MI. He reports that the pain is currently having is intermittent and is not like the pain that brought into the emergency room. His anginal pain started in his lower chest and radiated to his neck.  149/74, 99, 96% O2 sat  Appears uncomfortable. Regular rate and rhythm, no audible rub No wheezing 2+ right radial pulse, no hematoma  ECG repeated and reveals less prominent T-wave inversion anteriorly. No significant ST elevation.  A/P 1) recurrent chest pain post PCI for NSTEMI. This is different from his prior angina. This is different than what he had during balloon inflations during the procedure. I suspect this is more pleuritic as it is worse when he coughs. Dilaudid and Phenergan written for as well given his nausea. He is feeling better even before receiving these meds. If symptoms continue, could try IV nitroglycerin. He does have residual circumflex lesion. I don't think that lesion is causing this degree of symptoms at rest. We'll continue to follow closely.  Critical  Care time 40 minutes  Jettie Booze, MD

## 2015-06-22 NOTE — Progress Notes (Signed)
Patient arrived back on unit from cath lab. Bp cuff on leg 149/68 HR 71 RR 13 Sats 97% on room air.

## 2015-06-22 NOTE — Progress Notes (Signed)
E-link and central tele notified of patient transfer for cath lab procedure. Patient has voided, heparin gtt off.

## 2015-06-23 ENCOUNTER — Inpatient Hospital Stay (HOSPITAL_COMMUNITY): Payer: PPO

## 2015-06-23 ENCOUNTER — Encounter (HOSPITAL_COMMUNITY): Payer: Self-pay | Admitting: Interventional Cardiology

## 2015-06-23 DIAGNOSIS — R001 Bradycardia, unspecified: Secondary | ICD-10-CM | POA: Diagnosis not present

## 2015-06-23 DIAGNOSIS — R7989 Other specified abnormal findings of blood chemistry: Secondary | ICD-10-CM

## 2015-06-23 DIAGNOSIS — I10 Essential (primary) hypertension: Secondary | ICD-10-CM | POA: Diagnosis not present

## 2015-06-23 DIAGNOSIS — H409 Unspecified glaucoma: Secondary | ICD-10-CM | POA: Diagnosis not present

## 2015-06-23 DIAGNOSIS — I214 Non-ST elevation (NSTEMI) myocardial infarction: Secondary | ICD-10-CM | POA: Diagnosis not present

## 2015-06-23 LAB — ECHOCARDIOGRAM COMPLETE
HEIGHTINCHES: 73 in
Weight: 3035.2 oz

## 2015-06-23 LAB — BASIC METABOLIC PANEL
ANION GAP: 10 (ref 5–15)
BUN: 14 mg/dL (ref 6–20)
CHLORIDE: 106 mmol/L (ref 101–111)
CO2: 25 mmol/L (ref 22–32)
Calcium: 8.8 mg/dL — ABNORMAL LOW (ref 8.9–10.3)
Creatinine, Ser: 1.27 mg/dL — ABNORMAL HIGH (ref 0.61–1.24)
GFR calc Af Amer: >60 mL/min (ref >60)
GFR, EST NON AFRICAN AMERICAN: 56 mL/min — AB (ref >60)
GLUCOSE: 119 mg/dL — AB (ref 65–99)
POTASSIUM: 4.1 mmol/L (ref 3.5–5.1)
Sodium: 141 mmol/L (ref 135–145)

## 2015-06-23 LAB — CBC
HEMATOCRIT: 39.9 % (ref 39.0–52.0)
HEMOGLOBIN: 12.7 g/dL — AB (ref 13.0–17.0)
MCH: 30.1 pg (ref 26.0–34.0)
MCHC: 31.8 g/dL (ref 30.0–36.0)
MCV: 94.5 fL (ref 78.0–100.0)
PLATELETS: 159 10*3/uL (ref 150–400)
RBC: 4.22 MIL/uL (ref 4.22–5.81)
RDW: 13.4 % (ref 11.5–15.5)
WBC: 11.1 10*3/uL — AB (ref 4.0–10.5)

## 2015-06-23 LAB — TROPONIN I
Troponin I: 17.21 ng/mL (ref <0.031)
Troponin I: 18.76 ng/mL (ref <0.031)

## 2015-06-23 MED ORDER — ASPIRIN 81 MG PO CHEW
81.0000 mg | CHEWABLE_TABLET | ORAL | Status: AC
  Administered 2015-06-24: 81 mg via ORAL
  Filled 2015-06-23: qty 1

## 2015-06-23 MED ORDER — SODIUM CHLORIDE 0.9 % IV SOLN
250.0000 mL | INTRAVENOUS | Status: DC | PRN
Start: 2015-06-23 — End: 2015-06-24

## 2015-06-23 MED ORDER — SODIUM CHLORIDE 0.9 % WEIGHT BASED INFUSION
3.0000 mL/kg/h | INTRAVENOUS | Status: DC
  Administered 2015-06-24: 3 mL/kg/h via INTRAVENOUS

## 2015-06-23 MED ORDER — SODIUM CHLORIDE 0.9% FLUSH: 3.0000 mL | INTRAVENOUS | Status: DC | PRN

## 2015-06-23 MED ORDER — SODIUM CHLORIDE 0.9% FLUSH
3.0000 mL | Freq: Two times a day (BID) | INTRAVENOUS | Status: DC
Start: 2015-06-23 — End: 2015-06-24
  Administered 2015-06-23: 3 mL via INTRAVENOUS

## 2015-06-23 MED ORDER — SODIUM CHLORIDE 0.9 % WEIGHT BASED INFUSION
1.0000 mL/kg/h | INTRAVENOUS | Status: DC
  Administered 2015-06-24: 1 mL via INTRAVENOUS
  Administered 2015-06-24: 1 mL/kg/h via INTRAVENOUS

## 2015-06-23 NOTE — Progress Notes (Addendum)
PROGRESS NOTE  Subjective:    70 y.o. male with a history of GERD, SVT w/ ablation Admitted with chest pain and + troponin levels Is pain free now.  Plan is for cath on Monday  Troponin levels are relatively stable at 0.77  Had PCI of a complex LAD stenosis.   Still has a moderate - severe LCx stenosis that needs to be addressed  Had lots of chest pain last night.   Different from his presenting pain and different from the pain that he had during balloon inflations.   Objective:    Vital Signs:   Temp:  [97.9 F (36.6 C)-99 F (37.2 C)] 98.3 F (36.8 C) (03/14 0751) Pulse Rate:  [0-85] 80 (03/14 0751) Resp:  [0-37] 17 (03/14 0751) BP: (108-163)/(59-103) 128/63 mmHg (03/14 0751) SpO2:  [0 %-100 %] 93 % (03/14 0751) Weight:  [189 lb 11.2 oz (86.047 kg)] 189 lb 11.2 oz (86.047 kg) (03/14 0400)  Last BM Date: 06/22/15   24-hour weight change: Weight change: 0 lb (0 kg)  Weight trends: Filed Weights   06/20/15 0259 06/22/15 0304 06/23/15 0400  Weight: 185 lb (83.915 kg) 189 lb 11.2 oz (86.047 kg) 189 lb 11.2 oz (86.047 kg)    Intake/Output:  03/13 0701 - 03/14 0700 In: 553.3 [I.V.:553.3] Out: 1425 [Urine:1425]     Physical Exam: BP 128/63 mmHg  Pulse 80  Temp(Src) 98.3 F (36.8 C) (Oral)  Resp 17  Ht 6\' 1"  (1.854 m)  Wt 189 lb 11.2 oz (86.047 kg)  BMI 25.03 kg/m2  SpO2 93%  Wt Readings from Last 3 Encounters:  06/23/15 189 lb 11.2 oz (86.047 kg)  10/02/07 191 lb 8 oz (86.864 kg)  09/11/07 190 lb 8 oz (86.41 kg)    General: Vital signs reviewed and noted.   Head: Normocephalic, atraumatic.  Eyes: conjunctivae/corneas clear.  EOM's intact.   Throat: normal  Neck:  normal   Lungs:      Heart:  RR , normal S1S2  Abdomen:  Soft, non-tender, non-distended    Extremities: Good pulses.    Neurologic: A&O X3, CN II - XII are grossly intact.   Psych: Normal     Labs: BMET:  Recent Labs  06/23/15 0257  NA 141  K 4.1  CL 106  CO2 25    GLUCOSE 119*  BUN 14  CREATININE 1.27*  CALCIUM 8.8*    Liver function tests: No results for input(s): AST, ALT, ALKPHOS, BILITOT, PROT, ALBUMIN in the last 72 hours. No results for input(s): LIPASE, AMYLASE in the last 72 hours.  CBC:  Recent Labs  06/22/15 0250 06/22/15 2010 06/23/15 0257  WBC 6.1  --  11.1*  HGB 13.1  --  12.7*  HCT 40.5  --  39.9  MCV 94.2  --  94.5  PLT 162 170 159    Cardiac Enzymes:  Recent Labs  06/20/15 1621  TROPONINI 0.77*  0.76*    Coagulation Studies:  Recent Labs  06/20/15 2218  LABPROT 13.7  INR 1.03    Other: Invalid input(s): POCBNP No results for input(s): DDIMER in the last 72 hours. No results for input(s): HGBA1C in the last 72 hours. No results for input(s): CHOL, HDL, LDLCALC, TRIG, CHOLHDL in the last 72 hours. No results for input(s): TSH, T4TOTAL, T3FREE, THYROIDAB in the last 72 hours.  Invalid input(s): FREET3 No results for input(s): VITAMINB12, FOLATE, FERRITIN, TIBC, IRON, RETICCTPCT in the last 72 hours.   Other  results:  EKG  ( personally reviewed )  -  NSR ,  deept T wave inversion in anterior leads   Medications:    Infusions:    Scheduled Medications: . aspirin  81 mg Oral Daily  . latanoprost  1 drop Both Eyes Daily  . pantoprazole  40 mg Oral Daily  . sodium chloride flush  3 mL Intravenous Q12H  . ticagrelor  90 mg Oral BID  . timolol  1 drop Both Eyes Daily    Assessment/ Plan:   Active Problems:   Essential hypertension   Chest pain   Bradycardia   Glaucoma   Elevated troponin   NSTEMI (non-ST elevated myocardial infarction) ([place])  1. CAD - presented with chest pain .   Had PCI of a long LAD stenosis yesterday   Will get troponins to look for any signs of post procedure complications  Labs are ok for cath tomorrow -  with Dr. Claiborne Billings  2. HTN - BP is better   Disposition:  Possible second of staged PCI tomorrow .     Length of Stay: 3  Thayer Headings, Brooke Bonito., MD,  [facility] 06/23/2015, 8:26 AM Office [phone] Pager [phone]

## 2015-06-23 NOTE — Care Management Important Message (Signed)
Important Message  Patient Details  Name: Trevor Branch MRN: [id number] Date of Birth: [birth year]   Medicare Important Message Given:  Other (see comment) (Not yet needed per NCM)    Barb Merino [name] 06/23/2015, 12:16 PM

## 2015-06-23 NOTE — Progress Notes (Signed)
Echocardiogram 2D Echocardiogram has been performed.  Queshawn, [place] 06/23/2015, 8:55 AM

## 2015-06-23 NOTE — Care Management Note (Addendum)
Case Management Note  Patient Details  Name: Trevor Branch MRN: [id number] Date of Birth: 1945/07/29  Subjective/Objective:        Adm w ch pain, stent placed 3-13            Action/Plan: lives w wife, pcp dr Doyle Askew  Expected Discharge Date:                  Expected Discharge Plan:  Home/Self Care  In-House Referral:     Discharge planning Services  CM Consult, Medication Assistance  Post Acute Care Choice:    Choice offered to:     DME Arranged:    DME Agency:     HH Arranged:    HH Agency:     Status of Service:     Medicare Important Message Given:    Date Medicare IM Given:    Medicare IM give by:    Date Additional Medicare IM Given:    Additional Medicare Important Message give by:     If discussed at [place] of Stay Meetings, dates discussed:    Additional Comments:spoke w pt and gave pt 30day free brilinta card. Pt states has healthteam adv and vet adm ins. He gets most of meds from vet adm. Will have 45.00 per month copay for brilinta. Lacretia [name], RN 06/23/2015, 9:59 AM

## 2015-06-23 NOTE — Progress Notes (Signed)
Latest troponin level-17.21. DR.Nahser made aware , continue to monitor.

## 2015-06-23 NOTE — Progress Notes (Signed)
Pt feels bad today. C/o CP with deep breath and coughing. On narcotics and sts he is "drunk" and dizzy when he stands. Began ed with pt and wife including MI, stents, Brilinta, diet and CRPII. Will send referral to [facility]. Will f/u after staged PCI for ambulation and education. [place], ACSM 1:49 PM 06/23/2015

## 2015-06-24 ENCOUNTER — Encounter (HOSPITAL_COMMUNITY)
Admission: EM | Disposition: A | Discharge status: Home / Self Care | Payer: Self-pay | Source: Home / Self Care | Attending: Cardiovascular Disease

## 2015-06-24 DIAGNOSIS — R071 Chest pain on breathing: Secondary | ICD-10-CM

## 2015-06-24 DIAGNOSIS — I10 Essential (primary) hypertension: Secondary | ICD-10-CM | POA: Diagnosis not present

## 2015-06-24 DIAGNOSIS — I2510 Atherosclerotic heart disease of native coronary artery without angina pectoris: Secondary | ICD-10-CM | POA: Diagnosis not present

## 2015-06-24 DIAGNOSIS — H409 Unspecified glaucoma: Secondary | ICD-10-CM | POA: Diagnosis not present

## 2015-06-24 DIAGNOSIS — R001 Bradycardia, unspecified: Secondary | ICD-10-CM | POA: Diagnosis not present

## 2015-06-24 DIAGNOSIS — I214 Non-ST elevation (NSTEMI) myocardial infarction: Secondary | ICD-10-CM | POA: Diagnosis not present

## 2015-06-24 HISTORY — PX: CARDIAC CATHETERIZATION: SHX172

## 2015-06-24 LAB — POCT ACTIVATED CLOTTING TIME: Activated Clotting Time: 404 seconds

## 2015-06-24 LAB — CBC
HCT: 36.2 % — ABNORMAL LOW (ref 39.0–52.0)
HEMOGLOBIN: 11.6 g/dL — AB (ref 13.0–17.0)
MCH: 30.4 pg (ref 26.0–34.0)
MCHC: 32 g/dL (ref 30.0–36.0)
MCV: 94.8 fL (ref 78.0–100.0)
PLATELETS: 147 10*3/uL — AB (ref 150–400)
RBC: 3.82 MIL/uL — AB (ref 4.22–5.81)
RDW: 13.5 % (ref 11.5–15.5)
WBC: 8.8 10*3/uL (ref 4.0–10.5)

## 2015-06-24 SURGERY — LEFT HEART CATH AND CORONARY ANGIOGRAPHY

## 2015-06-24 MED ORDER — SODIUM CHLORIDE 0.9% FLUSH: 3.0000 mL | INTRAVENOUS | Status: DC | PRN

## 2015-06-24 MED ORDER — MIDAZOLAM HCL 2 MG/2ML IJ SOLN
INTRAMUSCULAR | Status: AC
  Filled 2015-06-24: qty 2

## 2015-06-24 MED ORDER — SODIUM CHLORIDE 0.9% FLUSH: 3.0000 mL | Freq: Two times a day (BID) | INTRAVENOUS | Status: DC

## 2015-06-24 MED ORDER — SODIUM CHLORIDE 0.9 % IV SOLN: 250.0000 mL | INTRAVENOUS | Status: DC | PRN

## 2015-06-24 MED ORDER — IOHEXOL 350 MG/ML SOLN
INTRAVENOUS | Status: DC | PRN
  Administered 2015-06-24: 185 mL via INTRAVENOUS

## 2015-06-24 MED ORDER — TICAGRELOR 90 MG PO TABS
90.0000 mg | ORAL_TABLET | Freq: Two times a day (BID) | ORAL | Status: DC
  Administered 2015-06-24 – 2015-06-25 (×2): 90 mg via ORAL
  Filled 2015-06-24 (×3): qty 1

## 2015-06-24 MED ORDER — LIDOCAINE HCL (PF) 1 % IJ SOLN
INTRAMUSCULAR | Status: DC | PRN
  Administered 2015-06-24: 3 mL

## 2015-06-24 MED ORDER — HEPARIN (PORCINE) IN NACL 2-0.9 UNIT/ML-% IJ SOLN
INTRAMUSCULAR | Status: AC
  Filled 2015-06-24: qty 1000

## 2015-06-24 MED ORDER — HEPARIN (PORCINE) IN NACL 2-0.9 UNIT/ML-% IJ SOLN
INTRAMUSCULAR | Status: DC | PRN
  Administered 2015-06-24: 1000 mL

## 2015-06-24 MED ORDER — FENTANYL CITRATE (PF) 100 MCG/2ML IJ SOLN
INTRAMUSCULAR | Status: DC | PRN
Start: 2015-06-24 — End: 2015-06-24
  Administered 2015-06-24: 25 ug via INTRAVENOUS

## 2015-06-24 MED ORDER — ASPIRIN 81 MG PO CHEW
81.0000 mg | CHEWABLE_TABLET | Freq: Every day | ORAL | Status: DC
  Administered 2015-06-25: 81 mg via ORAL
  Filled 2015-06-24: qty 1

## 2015-06-24 MED ORDER — BIVALIRUDIN BOLUS VIA INFUSION - CUPID
INTRAVENOUS | Status: DC | PRN
  Administered 2015-06-24: 64.65 mg via INTRAVENOUS

## 2015-06-24 MED ORDER — FENTANYL CITRATE (PF) 100 MCG/2ML IJ SOLN
INTRAMUSCULAR | Status: AC
  Filled 2015-06-24: qty 2

## 2015-06-24 MED ORDER — ACETAMINOPHEN 325 MG PO TABS: 650.0000 mg | ORAL_TABLET | ORAL | Status: DC | PRN

## 2015-06-24 MED ORDER — SODIUM CHLORIDE 0.9 % IV SOLN
INTRAVENOUS | Status: DC
  Administered 2015-06-24: 19:00:00 via INTRAVENOUS

## 2015-06-24 MED ORDER — BIVALIRUDIN 250 MG IV SOLR
250.0000 mg | INTRAVENOUS | Status: DC | PRN
  Administered 2015-06-24: 1.75 mg/kg/h via INTRAVENOUS

## 2015-06-24 MED ORDER — HEPARIN SODIUM (PORCINE) 1000 UNIT/ML IJ SOLN
INTRAMUSCULAR | Status: DC | PRN
  Administered 2015-06-24: 4500 [IU] via INTRAVENOUS

## 2015-06-24 MED ORDER — NITROGLYCERIN 1 MG/10 ML FOR IR/CATH LAB
INTRA_ARTERIAL | Status: DC | PRN
Start: 2015-06-24 — End: 2015-06-24
  Administered 2015-06-24: 100 ug via INTRACORONARY

## 2015-06-24 MED ORDER — MIDAZOLAM HCL 2 MG/2ML IJ SOLN
INTRAMUSCULAR | Status: DC | PRN
  Administered 2015-06-24: 2 mg via INTRAVENOUS
  Administered 2015-06-24: 1 mg via INTRAVENOUS

## 2015-06-24 MED ORDER — LIDOCAINE HCL (PF) 1 % IJ SOLN
INTRAMUSCULAR | Status: AC
  Filled 2015-06-24: qty 30

## 2015-06-24 MED ORDER — NITROGLYCERIN 1 MG/10 ML FOR IR/CATH LAB
INTRA_ARTERIAL | Status: DC | PRN
  Administered 2015-06-24: 100 ug via INTRACORONARY

## 2015-06-24 MED ORDER — VERAPAMIL HCL 2.5 MG/ML IV SOLN
INTRAVENOUS | Status: AC
  Filled 2015-06-24: qty 2

## 2015-06-24 MED ORDER — BIVALIRUDIN 250 MG IV SOLR
INTRAVENOUS | Status: AC
  Filled 2015-06-24: qty 250

## 2015-06-24 MED ORDER — VERAPAMIL HCL 2.5 MG/ML IV SOLN
INTRA_ARTERIAL | Status: DC | PRN
  Administered 2015-06-24: 20 mL via INTRA_ARTERIAL

## 2015-06-24 MED ORDER — NITROGLYCERIN 1 MG/10 ML FOR IR/CATH LAB
INTRA_ARTERIAL | Status: AC
  Filled 2015-06-24: qty 10

## 2015-06-24 MED ORDER — ONDANSETRON HCL 4 MG/2ML IJ SOLN: 4.0000 mg | Freq: Four times a day (QID) | INTRAMUSCULAR | Status: DC | PRN

## 2015-06-24 SURGICAL SUPPLY — 18 items
BALLN TREK RX 2.5X12 (BALLOONS) ×3
BALLN ~~LOC~~ EUPHORA RX 3.25X12 (BALLOONS) ×3
BALLOON TREK RX 2.5X12 (BALLOONS) IMPLANT
BALLOON ~~LOC~~ EUPHORA RX 3.25X12 (BALLOONS) IMPLANT
CATH INFINITI 5 FR JL3.5 (CATHETERS) ×2 IMPLANT
CATH INFINITI 5FR ANG PIGTAIL (CATHETERS) ×2 IMPLANT
CATH INFINITI JR4 5F (CATHETERS) ×2 IMPLANT
CATH VISTA GUIDE 6FR XB3.5 (CATHETERS) ×2 IMPLANT
CATH VISTA GUIDE 6FR XB4 (CATHETERS) ×2 IMPLANT
DEVICE RAD COMP TR BAND LRG (VASCULAR PRODUCTS) ×5 IMPLANT
GLIDESHEATH SLEND SS 6F .021 (SHEATH) ×2 IMPLANT
KIT ENCORE 26 ADVANTAGE (KITS) ×2 IMPLANT
KIT HEART LEFT (KITS) ×3 IMPLANT
PACK CARDIAC CATHETERIZATION (CUSTOM PROCEDURE TRAY) ×3 IMPLANT
STENT SYNERGY DES 3X16 (Permanent Stent) ×2 IMPLANT
TUBING CIL FLEX 10 FLL-RA (TUBING) ×3 IMPLANT
WIRE ASAHI PROWATER 180CM (WIRE) ×2 IMPLANT
WIRE SAFE-T 1.5MM-J .035X260CM (WIRE) ×2 IMPLANT

## 2015-06-24 NOTE — Interval H&P Note (Signed)
Cath Lab Visit (complete for each Cath Lab visit)  Clinical Evaluation Leading to the Procedure:   ACS: Yes.    Non-ACS:    Anginal Classification: CCS IV  Anti-ischemic medical therapy: Maximal Therapy (2 or more classes of medications)  Non-Invasive Test Results: No non-invasive testing performed  Prior CABG: No previous CABG      History and Physical Interval Note:  06/24/2015 8:53 AM  Trevor Branch  has presented today for surgery, with the diagnosis of cp  The various methods of treatment have been discussed with the patient and family. After consideration of risks, benefits and other options for treatment, the patient has consented to  Procedure(s): Left Heart Cath and Coronary Angiography ([name]) as a surgical intervention .  The patient's history has been reviewed, patient examined, no change in status, stable for surgery.  I have reviewed the patient's chart and labs.  Questions were answered to the patient's satisfaction.     KELLY,THOMAS A

## 2015-06-24 NOTE — Progress Notes (Signed)
PROGRESS NOTE  Subjective:    70 y.o. male with a history of GERD, SVT w/ ablation Admitted with chest pain and + troponin levels Is pain free now.  Plan is for cath on Monday  Troponin levels are relatively stable at 0.77  Had PCI of a complex LAD stenosis.   Still has a moderate - severe LCx stenosis that needs to be addressed  Feeling better  Going from 2nd of staged procedure today for PCI of the LCx  Objective:    Vital Signs:   Temp:  [98.9 F (37.2 C)-99.9 F (37.7 C)] 99.7 F (37.6 C) (03/15 0325) Pulse Rate:  [80] 80 (03/14 1631) Resp:  [15-22] 21 (03/15 0600) BP: (109-138)/(57-77) 109/65 mmHg (03/15 0600) SpO2:  [93 %-97 %] 96 % (03/15 0600) Weight:  [190 lb (86.183 kg)] 190 lb (86.183 kg) (03/15 0020)  Last BM Date: 06/22/15   24-hour weight change: Weight change: 4.8 oz (0.136 kg)  Weight trends: Filed Weights   06/23/15 0400 06/23/15 1921 06/24/15 0020  Weight: 189 lb 11.2 oz (86.047 kg) 190 lb (86.183 kg) 190 lb (86.183 kg)    Intake/Output:  03/14 0701 - 03/15 0700 In: 805.8 [P.O.:780; I.V.:25.8] Out: 550 [Urine:550]     Physical Exam: BP 109/65 mmHg  Pulse 80  Temp(Src) 99.7 F (37.6 C) (Oral)  Resp 21  Ht 6\' 1"  (1.854 m)  Wt 190 lb (86.183 kg)  BMI 25.07 kg/m2  SpO2 96%  Wt Readings from Last 3 Encounters:  06/24/15 190 lb (86.183 kg)  10/02/07 191 lb 8 oz (86.864 kg)  09/11/07 190 lb 8 oz (86.41 kg)    General: Vital signs reviewed and noted.   Head: Normocephalic, atraumatic.  Eyes: conjunctivae/corneas clear.  EOM's intact.   Throat: normal  Neck:  normal   Lungs:    clear   Heart:  RR , normal S1S2  Abdomen:  Soft, non-tender, non-distended    Extremities: Good pulses.    Neurologic: A&O X3, CN II - XII are grossly intact.   Psych: Normal     Labs: BMET:  Recent Labs  06/23/15 0257  NA 141  K 4.1  CL 106  CO2 25  GLUCOSE 119*  BUN 14  CREATININE 1.27*  CALCIUM 8.8*    Liver function tests: No  results for input(s): AST, ALT, ALKPHOS, BILITOT, PROT, ALBUMIN in the last 72 hours. No results for input(s): LIPASE, AMYLASE in the last 72 hours.  CBC:  Recent Labs  06/23/15 0257 06/24/15 0320  WBC 11.1* 8.8  HGB 12.7* 11.6*  HCT 39.9 36.2*  MCV 94.5 94.8  PLT 159 147*    Cardiac Enzymes:  Recent Labs  06/23/15 0851 06/23/15 1425  TROPONINI 17.21* 18.76*    Coagulation Studies: No results for input(s): LABPROT, INR in the last 72 hours.  Other: Invalid input(s): POCBNP No results for input(s): DDIMER in the last 72 hours. No results for input(s): HGBA1C in the last 72 hours. No results for input(s): CHOL, HDL, LDLCALC, TRIG, CHOLHDL in the last 72 hours. No results for input(s): TSH, T4TOTAL, T3FREE, THYROIDAB in the last 72 hours.  Invalid input(s): FREET3 No results for input(s): VITAMINB12, FOLATE, FERRITIN, TIBC, IRON, RETICCTPCT in the last 72 hours.   Other results:  EKG  ( personally reviewed )  -  NSR ,  deept T wave inversion in anterior leads   Medications:    Infusions: . sodium chloride 1 mL/kg/hr (06/24/15 [id number])  Scheduled Medications: . aspirin  81 mg Oral Daily  . latanoprost  1 drop Both Eyes Daily  . pantoprazole  40 mg Oral Daily  . sodium chloride flush  3 mL Intravenous Q12H  . sodium chloride flush  3 mL Intravenous Q12H  . ticagrelor  90 mg Oral BID  . timolol  1 drop Both Eyes Daily    Assessment/ Plan:   Active Problems:   Essential hypertension   Chest pain   Bradycardia   Glaucoma   Elevated troponin   NSTEMI (non-ST elevated myocardial infarction) ([place])  1. CAD - presented with chest pain .   Had PCI of a long LAD stenosis Monday  For PCI of the LCx today      2. HTN - BP is better   Disposition:  Possible second of staged PC    Length of Stay: 4  Thayer Headings, Brooke Bonito., MD, [facility] 06/24/2015, 8:15 AM Office [phone] Pager [phone]

## 2015-06-24 NOTE — H&P (View-Only) (Signed)
PROGRESS NOTE  Subjective:    70 y.o. male with a history of GERD, SVT w/ ablation Admitted with chest pain and + troponin levels Is pain free now.  Plan is for cath on Monday  Troponin levels are relatively stable at 0.77  Had PCI of a complex LAD stenosis.   Still has a moderate - severe LCx stenosis that needs to be addressed  Feeling better  Going from 2nd of staged procedure today for PCI of the LCx  Objective:    Vital Signs:   Temp:  [98.9 F (37.2 C)-99.9 F (37.7 C)] 99.7 F (37.6 C) (03/15 0325) Pulse Rate:  [80] 80 (03/14 1631) Resp:  [15-22] 21 (03/15 0600) BP: (109-138)/(57-77) 109/65 mmHg (03/15 0600) SpO2:  [93 %-97 %] 96 % (03/15 0600) Weight:  [190 lb (86.183 kg)] 190 lb (86.183 kg) (03/15 0020)  Last BM Date: 06/22/15   24-hour weight change: Weight change: 4.8 oz (0.136 kg)  Weight trends: Filed Weights   06/23/15 0400 06/23/15 1921 06/24/15 0020  Weight: 189 lb 11.2 oz (86.047 kg) 190 lb (86.183 kg) 190 lb (86.183 kg)    Intake/Output:  03/14 0701 - 03/15 0700 In: 805.8 [P.O.:780; I.V.:25.8] Out: 550 [Urine:550]     Physical Exam: BP 109/65 mmHg  Pulse 80  Temp(Src) 99.7 F (37.6 C) (Oral)  Resp 21  Ht 6\' 1"  (1.854 m)  Wt 190 lb (86.183 kg)  BMI 25.07 kg/m2  SpO2 96%  Wt Readings from Last 3 Encounters:  06/24/15 190 lb (86.183 kg)  10/02/07 191 lb 8 oz (86.864 kg)  09/11/07 190 lb 8 oz (86.41 kg)    General: Vital signs reviewed and noted.   Head: Normocephalic, atraumatic.  Eyes: conjunctivae/corneas clear.  EOM's intact.   Throat: normal  Neck:  normal   Lungs:    clear   Heart:  RR , normal S1S2  Abdomen:  Soft, non-tender, non-distended    Extremities: Good pulses.    Neurologic: A&O X3, CN II - XII are grossly intact.   Psych: Normal     Labs: BMET:  Recent Labs  06/23/15 0257  NA 141  K 4.1  CL 106  CO2 25  GLUCOSE 119*  BUN 14  CREATININE 1.27*  CALCIUM 8.8*    Liver function tests: No  results for input(s): AST, ALT, ALKPHOS, BILITOT, PROT, ALBUMIN in the last 72 hours. No results for input(s): LIPASE, AMYLASE in the last 72 hours.  CBC:  Recent Labs  06/23/15 0257 06/24/15 0320  WBC 11.1* 8.8  HGB 12.7* 11.6*  HCT 39.9 36.2*  MCV 94.5 94.8  PLT 159 147*    Cardiac Enzymes:  Recent Labs  06/23/15 0851 06/23/15 1425  TROPONINI 17.21* 18.76*    Coagulation Studies: No results for input(s): LABPROT, INR in the last 72 hours.  Other: Invalid input(s): POCBNP No results for input(s): DDIMER in the last 72 hours. No results for input(s): HGBA1C in the last 72 hours. No results for input(s): CHOL, HDL, LDLCALC, TRIG, CHOLHDL in the last 72 hours. No results for input(s): TSH, T4TOTAL, T3FREE, THYROIDAB in the last 72 hours.  Invalid input(s): FREET3 No results for input(s): VITAMINB12, FOLATE, FERRITIN, TIBC, IRON, RETICCTPCT in the last 72 hours.   Other results:  EKG  ( personally reviewed )  -  NSR ,  deept T wave inversion in anterior leads   Medications:    Infusions: . sodium chloride 1 mL/kg/hr (06/24/15 [id number])  Scheduled Medications: . aspirin  81 mg Oral Daily  . latanoprost  1 drop Both Eyes Daily  . pantoprazole  40 mg Oral Daily  . sodium chloride flush  3 mL Intravenous Q12H  . sodium chloride flush  3 mL Intravenous Q12H  . ticagrelor  90 mg Oral BID  . timolol  1 drop Both Eyes Daily    Assessment/ Plan:   Active Problems:   Essential hypertension   Chest pain   Bradycardia   Glaucoma   Elevated troponin   NSTEMI (non-ST elevated myocardial infarction) ([place])  1. CAD - presented with chest pain .   Had PCI of a long LAD stenosis Monday  For PCI of the LCx today      2. HTN - BP is better   Disposition:  Possible second of staged PC    Length of Stay: 4  Thayer Headings, Brooke Bonito., MD, [facility] 06/24/2015, 8:15 AM Office [phone] Pager [phone]

## 2015-06-25 ENCOUNTER — Encounter (HOSPITAL_COMMUNITY): Payer: Self-pay | Admitting: Cardiovascular Disease

## 2015-06-25 DIAGNOSIS — I10 Essential (primary) hypertension: Secondary | ICD-10-CM | POA: Diagnosis not present

## 2015-06-25 DIAGNOSIS — I214 Non-ST elevation (NSTEMI) myocardial infarction: Secondary | ICD-10-CM | POA: Diagnosis not present

## 2015-06-25 DIAGNOSIS — R001 Bradycardia, unspecified: Secondary | ICD-10-CM | POA: Diagnosis not present

## 2015-06-25 DIAGNOSIS — R7989 Other specified abnormal findings of blood chemistry: Secondary | ICD-10-CM | POA: Diagnosis not present

## 2015-06-25 DIAGNOSIS — H409 Unspecified glaucoma: Secondary | ICD-10-CM | POA: Diagnosis not present

## 2015-06-25 LAB — BASIC METABOLIC PANEL
Anion gap: 9 (ref 5–15)
BUN: 18 mg/dL (ref 6–20)
CHLORIDE: 111 mmol/L (ref 101–111)
CO2: 21 mmol/L — ABNORMAL LOW (ref 22–32)
Calcium: 8 mg/dL — ABNORMAL LOW (ref 8.9–10.3)
Creatinine, Ser: 1.32 mg/dL — ABNORMAL HIGH (ref 0.61–1.24)
GFR calc Af Amer: >60 mL/min (ref >60)
GFR calc non Af Amer: 53 mL/min — ABNORMAL LOW (ref >60)
GLUCOSE: 103 mg/dL — AB (ref 65–99)
POTASSIUM: 3.8 mmol/L (ref 3.5–5.1)
Sodium: 141 mmol/L (ref 135–145)

## 2015-06-25 LAB — CBC
HCT: 33.1 % — ABNORMAL LOW (ref 39.0–52.0)
HEMOGLOBIN: 10.8 g/dL — AB (ref 13.0–17.0)
MCH: 30.9 pg (ref 26.0–34.0)
MCHC: 32.6 g/dL (ref 30.0–36.0)
MCV: 94.6 fL (ref 78.0–100.0)
Platelets: 141 10*3/uL — ABNORMAL LOW (ref 150–400)
RBC: 3.5 MIL/uL — AB (ref 4.22–5.81)
RDW: 13.5 % (ref 11.5–15.5)
WBC: 6.5 10*3/uL (ref 4.0–10.5)

## 2015-06-25 MED ORDER — EZETIMIBE 10 MG PO TABS
10.0000 mg | ORAL_TABLET | Freq: Every day | ORAL | Status: DC
Start: 2015-06-25 — End: 2015-06-25

## 2015-06-25 MED ORDER — EZETIMIBE 10 MG PO TABS: 10.0000 mg | ORAL_TABLET | Freq: Every day | ORAL | Status: DC

## 2015-06-25 MED ORDER — ASPIRIN 81 MG PO CHEW: 81.0000 mg | CHEWABLE_TABLET | Freq: Every day | ORAL | Status: DC

## 2015-06-25 MED ORDER — TICAGRELOR 90 MG PO TABS: 90.0000 mg | ORAL_TABLET | Freq: Two times a day (BID) | ORAL | Status: DC

## 2015-06-25 NOTE — Progress Notes (Signed)
PROGRESS NOTE  Subjective:    70 y.o. Branch with a history of GERD, SVT w/ ablation Admitted with chest pain and + troponin levels Is pain free now.  Plan is for cath on Monday  Troponin levels are relatively stable at 0.77  Had PCI of a complex LAD stenosis and  a moderate - severe LCx stenosis   Feeling better     Objective:    Vital Signs:   Temp:  [98.3 F (36.8 C)-99.2 F (37.3 C)] 98.3 F (36.8 C) (03/16 0736) Pulse Rate:  [70-79] 74 (03/16 0736) Resp:  [0-25] 17 (03/16 0736) BP: (93-111)/(53-67) 100/64 mmHg (03/16 0736) SpO2:  [94 %-99 %] 94 % (03/16 0736) Weight:  [193 lb 12.8 oz (87.907 kg)] 193 lb 12.8 oz (87.907 kg) (03/16 0400)  Last BM Date: 06/22/15   24-hour weight change: Weight change: 3 lb 12.8 oz (1.724 kg)  Weight trends: Filed Weights   06/23/15 1921 06/24/15 0020 06/25/15 0400  Weight: 190 lb (Trevor.183 kg) 190 lb (Trevor.183 kg) 193 lb 12.8 oz (87.907 kg)    Intake/Output:  03/15 0701 - 03/16 0700 In: 1000 [I.V.:1000] Out: 1009 [Urine:1009] Total I/O In: 200 [P.O.:200] Out: 150 [Urine:150]   Physical Exam: BP 100/64 mmHg  Pulse 74  Temp(Src) 98.3 F (36.8 C) (Oral)  Resp 17  Ht 6\' 1"  (1.854 m)  Wt 193 lb 12.8 oz (87.907 kg)  BMI 25.57 kg/m2  SpO2 94%  Wt Readings from Last 3 Encounters:  06/25/15 193 lb 12.8 oz (87.907 kg)  10/02/07 191 lb 8 oz (Trevor.864 kg)  09/11/07 190 lb 8 oz (Trevor.41 kg)    General: Vital signs reviewed and noted.   Head: Normocephalic, atraumatic.  Eyes: conjunctivae/corneas clear.  EOM's intact.   Throat: normal  Neck:  normal   Lungs:    clear   Heart:  RR , normal S1S2  Abdomen:  Soft, non-tender, non-distended    Extremities: Good pulses.    Neurologic: A&O X3, CN II - XII are grossly intact.   Psych: Normal     Labs: BMET:  Recent Labs  06/23/15 0257 06/25/15 0300  NA 141 141  K 4.1 3.8  CL 106 111  CO2 25 21*  GLUCOSE 119* 103*  BUN 14 18  CREATININE 1.27* 1.32*  CALCIUM 8.8*  8.0*    Liver function tests: No results for input(s): AST, ALT, ALKPHOS, BILITOT, PROT, ALBUMIN in the last 72 hours. No results for input(s): LIPASE, AMYLASE in the last 72 hours.  CBC:  Recent Labs  06/24/15 0320 06/25/15 0300  WBC 8.8 6.5  HGB 11.6* 10.8*  HCT 36.2* 33.1*  MCV 94.8 94.6  PLT 147* 141*    Cardiac Enzymes:  Recent Labs  06/23/15 0851 06/23/15 1425  TROPONINI 17.21* 18.76*    Coagulation Studies: No results for input(s): LABPROT, INR in the last 72 hours.  Other: Invalid input(s): POCBNP No results for input(s): DDIMER in the last 72 hours. No results for input(s): HGBA1C in the last 72 hours. No results for input(s): CHOL, HDL, LDLCALC, TRIG, CHOLHDL in the last 72 hours. No results for input(s): TSH, T4TOTAL, T3FREE, THYROIDAB in the last 72 hours.  Invalid input(s): FREET3 No results for input(s): VITAMINB12, FOLATE, FERRITIN, TIBC, IRON, RETICCTPCT in the last 72 hours.   Other results:      Medications:    Infusions: . sodium chloride Stopped (06/25/15 0500)    Scheduled Medications: . aspirin  81 mg Oral  Daily  . latanoprost  1 drop Both Eyes Daily  . pantoprazole  40 mg Oral Daily  . sodium chloride flush  3 mL Intravenous Q12H  . sodium chloride flush  3 mL Intravenous Q12H  . ticagrelor  90 mg Oral BID  . timolol  1 drop Both Eyes Daily    Assessment/ Plan:   Active Problems:   Essential hypertension   Chest pain   Bradycardia   Glaucoma   Elevated troponin   NSTEMI (non-ST elevated myocardial infarction) ([place])  1. CAD - presented with chest pain .   Had PCI of a long LAD stenosis Monday  And LCx Wednesday  intolerent to statins   Will start zetia      2. HTN - BP is better   To see PA in several weeks and will see me in several months    Disposition:  DC to home today    Length of Stay: 5  Thayer Headings, Brooke Bonito., MD, [facility] 06/25/2015, 10:30 AM Office [phone] Pager [phone]

## 2015-06-25 NOTE — Discharge Summary (Signed)
Discharge Summary    Patient ID: Trevor Branch,  MRN: [id number], DOB/AGE: December 12, 1945 70 y.o.  Admit date: 06/20/2015 Discharge date: 06/25/2015  Primary Care Provider: Orpah Melter Primary Cardiologist: Caryl Comes  Discharge Diagnoses    Active Problems:   Essential hypertension   Chest pain   Bradycardia   Glaucoma   Elevated troponin   NSTEMI (non-ST elevated myocardial infarction) ([place])   Allergies Allergies  Allergen Reactions  . Atorvastatin     REACTION: intolerant  . Rosuvastatin     REACTION: intolerant  . Verapamil Hcl Er   . Imitrex [Sumatriptan] Palpitations    Diagnostic Studies/Procedures    Procedures    Coronary Stent Intervention   Left Heart Cath and Coronary Angiography   Conclusion     1st Mrg lesion, 80% stenosed.  Mid LAD lesion, 99% diffuse stenosis, which was the culprit for his presentation. Post intervention with overlapping Synergy drug-eluting stents, there is a 0% residual stenosis.  Normal LVEDP.  Check echocardiogram to evaluate LV function. I suspect, he will have an anterior wall motion abnormality. Continue IV tirofiban for 4 hours. I stressed the importance of dual antiplatelet therapy to the family. He is a candidate for the twilight study. Continue aggressive secondary prevention. Per the family, the patient does not have a healthy diet or lifestyle otherwise. He will need aggressive secondary prevention.  Consider bringing him back for intervention of the OM vessel at a later time. Given the discomfort that he had, I did not want to proceed with a second vessel intervention. Per the family, he has been having ischemic symptoms for quite some time.   Case discussed with Dr. Acie Fredrickson who has been following him in the hospital.    Diagnostic Diagram PCI of LAD 3/13                       Post-Intervention Diagram                   Echocardiogram .Study Conclusions  - Left ventricle: The cavity size was normal.  Systolic function was  moderately reduced. The estimated ejection fraction was in the  range of 35% to 40%. There is akinesis of the  mid-apicalanteroseptal, inferoseptal, and apical myocardium.  Doppler parameters are consistent with abnormal left ventricular  relaxation (grade 1 diastolic dysfunction). - Aortic valve: There was trivial regurgitation.  _____________   History of Present Illness     Trevor Branch is a 70 y.o. Male with a past medical history of GERD (gastroesophageal reflux disease).  He presented with chest pain started 3/10 radiating to both arms as well as jaw lasting for the past 4 hours he was resting at the time. They pain would not improve with rest. He went kayaking on 9th and started to have mild chest pain while tieing things up but not paddling.  Reports occasionally gets short of breath when he tries to jog and gets mild discomfort in his chest. 2 weeks ago he had an echo done to evaluate this and it was reportedly normal. He used to be a runner and reports hx of bradycardia. Describes pain as pressure-like No associated nausea or vomiting EMS administered 1 nitroglycerin and aspirin 324 mg patient states that he have had a echogram in 2016 which was normal. NO blood in stool no easy bleeding  IN ER: Chest pain finally re-resolved after having nitroglycerin ointment placed Troponin 0.04 EKGs are not acute changes crying  was noted be 1.26 at baseline fluctuates between 1.1 and 1.2  Regarding pertinent past history: Patient had a Myoview scan done in March 2012 showed no evidence of ischemia.  He has been diagnosed in 2012 with SVT as well as a right bundle branch block  Hospital Course     Consultants: None   Patient was started on IV heparin and underwent left heart catheterization on Monday.  This revealed: 1st Mrg lesion, 80% stenosed. Mid LAD lesion, 99% diffuse stenosis, which was the culprit for his presentation. Post intervention with overlapping  Synergy drug-eluting stents, there is a 0% residual stenosis. Normal LVEDP.  On 3/15 he underwent staged cath and successful PCI to the marginal with a 0% residual stenosis.  Patient was enrolled in the twilight study.  Echocardiogram revealed ejection fraction of 35-40% with akinesis of the mid apicalanteroseptal, inferoseptal, and apical myocardium. No ACE-I/ARB due to hypotension.  No beta blocker due to bradycardia and hypotension. He Was started on Zetia because he has a statin allergy.  The patient was seen by Dr. Acie Fredrickson who felt he was stable for DC home.   _____________  Discharge Vitals Blood pressure 100/64, pulse 74, temperature 98.3 F (36.8 C), temperature source Oral, resp. rate 17, height 6\' 1"  (1.854 m), weight 193 lb 12.8 oz (87.907 kg), SpO2 94 %.  Filed Weights   06/23/15 1921 06/24/15 0020 06/25/15 0400  Weight: 190 lb (86.183 kg) 190 lb (86.183 kg) 193 lb 12.8 oz (87.907 kg)    Labs & Radiologic Studies     CBC  Recent Labs  06/24/15 0320 06/25/15 0300  WBC 8.8 6.5  HGB 11.6* 10.8*  HCT 36.2* 33.1*  MCV 94.8 94.6  PLT 147* [id number]*   Basic Metabolic Panel  Recent Labs  06/23/15 0257 06/25/15 0300  NA 141 141  K 4.1 3.8  CL 106 111  CO2 25 21*  GLUCOSE 119* 103*  BUN 14 18  CREATININE 1.27* 1.32*  CALCIUM 8.8* 8.0*   Liver Function Tests No results for input(s): AST, ALT, ALKPHOS, BILITOT, PROT, ALBUMIN in the last 72 hours. No results for input(s): LIPASE, AMYLASE in the last 72 hours. Cardiac Enzymes  Recent Labs  06/23/15 0851 06/23/15 1425  TROPONINI 17.21* 18.76*    Dg Chest 2 View  06/20/2015  CLINICAL DATA:  Chest pain.  History of bronchitis EXAM: CHEST  2 VIEW COMPARISON:  12/09/2006 FINDINGS: Normal heart size and mediastinal contours. No acute infiltrate or edema. No effusion or pneumothorax. No acute osseous findings. IMPRESSION: No active cardiopulmonary disease. Electronically Signed   By: Monte Fantasia M.D.   On: 06/20/2015 04:10     Disposition   Pt is being discharged home today in good condition.  Follow-up Plans & Appointments    Follow-up Information    Follow up with Richardson Dopp, PA-C On 07/09/2015.   Specialties:  Physician Assistant, Radiology, Interventional Cardiology   Why:  12:10 PM   Contact information:   [id number] [place] [address] [phone]       Follow up with Fain Francis, Wonda Cheng, MD On 10/05/2015.   Specialty:  Cardiology   Why:  10:30 AM   Contact information:   [address] [place] [address] [phone]      Discharge Instructions    Amb Referral to Cardiac Rehabilitation    Complete by:  As directed   Diagnosis:   Myocardial Infarction PCI       Amb  Referral to Cardiac Rehabilitation    Complete by:  As directed   Diagnosis:   Myocardial Infarction PCI       Diet - low sodium heart healthy    Complete by:  As directed      Discharge instructions    Complete by:  As directed   Monitor your weight every morning.  If you gain 3 pounds in 24 hours, or 5 pounds in a week, call the office for instructions([phone])     Increase activity slowly    Complete by:  As directed            Discharge Medications   Current Discharge Medication List    START taking these medications   Details  aspirin 81 MG chewable tablet Chew 1 tablet (81 mg total) by mouth daily.    ezetimibe (ZETIA) 10 MG tablet Take 1 tablet (10 mg total) by mouth daily. Qty: 30 tablet, Refills: 11    ticagrelor (BRILINTA) 90 MG TABS tablet Take 1 tablet (90 mg total) by mouth 2 (two) times daily. Qty: 60 tablet, Refills: 11      CONTINUE these medications which have NOT CHANGED   Details  ALPRAZolam (XANAX) 0.5 MG tablet Take 0.5 mg by mouth as needed for anxiety.    latanoprost (XALATAN) 0.005 % ophthalmic solution Place 1 drop into both eyes daily.    omeprazole (PRILOSEC) 20 MG capsule Take 20 mg by mouth 2 (two) times daily before a meal.      timolol (TIMOPTIC) 0.5 % ophthalmic solution Place 1 drop into both eyes daily.         Aspirin prescribed at discharge?  Yes High Intensity Statin Prescribed? (Lipitor 40-80mg  or Crestor 20-40mg ): No: Allergy Beta Blocker Prescribed? No: Bradycardia For EF 45% or less, Was ACEI/ARB Prescribed? No: Hypotension ADP Receptor Inhibitor Prescribed? (i.e. Plavix etc.-Includes Medically Managed Patients): Yes For EF <40%, Aldosterone Inhibitor Prescribed? No: Hypotension Was EF assessed during THIS hospitalization? Yes Was Cardiac Rehab II ordered? (Included Medically managed Patients): Yes   Outstanding Labs/Studies     Duration of Discharge Encounter   Greater than 30 minutes including physician time.  Otilio Connors [facility] 06/25/2015, 11:05 AM  Attending Note:   The patient was seen and examined.  Agree with assessment and plan as noted above.  Changes made to the above note as needed.  See progress note from earlier today Pt is stable for DC  Thayer Headings, Brooke Bonito., MD, [facility] 06/25/2015, 4:52 PM [id number],  [place] Pager [phone]

## 2015-06-25 NOTE — Research (Signed)
TWILIGHT  RESEARCH Informed Consent   Subject Name: Trevor Branch  Subject met inclusion and exclusion criteria.  The informed consent form, study requirements and expectations were reviewed with the subject and questions and concerns were addressed prior to the signing of the consent form.  The subject verbalized understanding of the trial requirements.  The subject agreed to participate in the [place] trial and signed the informed consent.  The informed consent was obtained prior to performance of any protocol-specific procedures for the subject.  A copy of the signed informed consent was given to the subject and a copy was placed in the subject's medical record.  Desmond Dike H 06/25/2015, 09:00 AM

## 2015-06-25 NOTE — Progress Notes (Signed)
CARDIAC REHAB PHASE I   PRE:  Rate/Rhythm: 98 SR  BP:  Sitting: 118/68        SaO2: 97 RA  MODE:  Ambulation: 700 ft   POST:  Rate/Rhythm: 86 SR  BP:  Sitting: 129/69         SaO2: 100 RA  Pt ambulated 700 ft on RA, handheld assist, steady gait, tolerated well.  Pt c/o of mild DOE, denies cp, dizziness, declined rest stop. Completed MI/stent education.  Reviewed risk factors, anti-platelet therapy, stent card, activity restrictions, ntg, exercise, heart healthy diet, sodium restrictions, portion control, and phase 2 cardiac rehab. Pt verbalized understanding, receptive to education. Pt states he will not take a statin due to questionable adverse reaction in the past. MD aware. Pt agrees to phase 2 cardiac rehab referral, will send to [facility]. Pt to bed per pt request after walk, call bell within reach.   [id number] Lenna Sciara, RN, BSN 06/25/2015 10:57 AM

## 2015-06-25 NOTE — Progress Notes (Signed)
Discharged home by wheelchair, accompanied by wife, discharged instructions given to pt. Belongings taken home.

## 2015-06-29 ENCOUNTER — Other Ambulatory Visit: Payer: Self-pay | Admitting: *Deleted

## 2015-06-29 MED ORDER — AMBULATORY NON FORMULARY MEDICATION: 90.0000 mg | Freq: Two times a day (BID) | Status: DC

## 2015-06-29 MED ORDER — AMBULATORY NON FORMULARY MEDICATION: 81.0000 mg | Freq: Every day | Status: DC

## 2015-07-02 ENCOUNTER — Telehealth: Payer: Self-pay | Admitting: Cardiovascular Disease

## 2015-07-02 NOTE — Telephone Encounter (Signed)
Spoke with pt and informed him of information provided by Dr. Acie Fredrickson.  Pt verbalized understanding and was in agreement with this plan.

## 2015-07-02 NOTE — Telephone Encounter (Signed)
New message      Pt c/o of Chest Pain: STAT if CP now or developed within 24 hours  1. Are you having CP right now? Yes---hurts when he takes a deep breath  2. Are you experiencing any other symptoms (ex. SOB, nausea, vomiting, sweating)? Coughing, sob 3. How long have you been experiencing CP?  Recently had heart attack and has stents 4. Is your CP continuous or coming and going? continuous 5. Have you taken Nitroglycerin? no

## 2015-07-02 NOTE — Telephone Encounter (Signed)
Pt was recently hospitalized and had stents placed on 3/13 and 3/15. Pt states that since coming home he has had a deep, chesty cough that is productive with a clear mucus.  Pt states that his chest is sore and he wasn't sure if it was from the cough or if he had "messed something up they did in the hospital", referring to his stents.   Vitals are 128/69, HR 72. Advised pt that I would route this info to Dr. Acie Fredrickson for review and advisement but that he should also call his PCP to see about seeing them since he has as productive cough.  Pt states that PCP had given him a cough syrup with Guaifenesin and an antibiotic that caused him to break out in a rash but prednisone relieved this.  Pt feels the Guaifenesin caused the rash and wants to know what he could take instead for his cough. Will route this message to Dr. Acie Fredrickson and his nurse, Sharyn Lull for review.

## 2015-07-02 NOTE — Telephone Encounter (Signed)
His cough is not related to the stents.  He should see his medical doctor for further evaluation of this cough. He will not be able to "mess up "the stents by coughing but he does need to have this looked. At.

## 2015-07-05 DIAGNOSIS — R05 Cough: Secondary | ICD-10-CM | POA: Diagnosis not present

## 2015-07-07 DIAGNOSIS — I213 ST elevation (STEMI) myocardial infarction of unspecified site: Secondary | ICD-10-CM | POA: Diagnosis not present

## 2015-07-07 DIAGNOSIS — R140 Abdominal distension (gaseous): Secondary | ICD-10-CM | POA: Diagnosis not present

## 2015-07-07 DIAGNOSIS — R05 Cough: Secondary | ICD-10-CM | POA: Diagnosis not present

## 2015-07-08 ENCOUNTER — Other Ambulatory Visit (HOSPITAL_BASED_OUTPATIENT_CLINIC_OR_DEPARTMENT_OTHER): Payer: Self-pay | Admitting: Family Medicine

## 2015-07-08 DIAGNOSIS — R140 Abdominal distension (gaseous): Secondary | ICD-10-CM

## 2015-07-08 NOTE — Progress Notes (Addendum)
Cardiology Office Note:    Date:  07/09/2015   ID:  [name], DOB Mar 23, 1946, MRN [id number]  PCP:  Orpah Melter, MD  Cardiologist:  Dr. Liam Rogers   Electrophysiologist:  Dr. Virl Axe (2007)  Chief Complaint  Patient presents with  . Hospitalization Follow-up    NSTEMI >> PCI    History of Present Illness:     Trevor Branch is a 70 y.o. male with a hx of SVT s/p RFCA, RBBB.    Admitted 3/11-3/16 with NSTEMI.  LHC demonstrated OM1 80%, mid LAD 99%.  He underwent PCI with DES x 2 to the mLAD. Echo confirmed ischemic CM with EF 35-40% with ant-septal, inf-septal and apical wall motion abnormalities.  He returned to the Cath Lab 3/15 and underwent staged PCI of the OM1 with a Synergy DES.  LVEDP 17-19 mmHg.  No beta-blocker was started due to bradycardia.  He was not put on ACE inhibitor due to low BP.  He is allergic to statins and was put on Zetia.    Called in post DC with cough.  He had recent antibiotics with rash development.  He was referred to his PCP for further management.  Here for FU.  Here today with his wife. He saw his PCP this AM. CXR apparently showed a L pleural effusion.  He complains of a cough since 7/16. He has been on several rounds of antibiotics.  Also notes abdominal bloating since 9/16.  He feels his cough got worse after DC.  Now, sleeping in a recliner b/c of his cough.  No PND. Denies syncope.  He has had some L chest pain that is pleuritic, worse with positional changes.  It is not like his prior angina.     Past Medical History  Diagnosis Date  . GERD (gastroesophageal reflux disease)   . SVT (supraventricular tachycardia) (HCC)     s/p prior RFCA  . PTSD (post-traumatic stress disorder)     panic attacks  . CAD (coronary artery disease)     a. NSTEMI 3/17 - s/p DES x 2 to mid LAD; staged PCI with DES to OM1; residual CAD - mLCX 40%, pRCA 30%, mRCA 20%   . History of non-ST elevation myocardial infarction (NSTEMI) 06/2015  . Ischemic  cardiomyopathy     a. Echo 06/23/15 - EF 35-40%, ant-septal, inferoseptal, apical AK, Gr 1 DD, trivial AI    Past Surgical History  Procedure Laterality Date  . Supraventricular tachycardia ablation  2009    at [facility]  . Cardiac catheterization [name] 06/22/2015    Procedure: Left Heart Cath and Coronary Angiography;  Surgeon: Jettie Booze, MD;  Location: [facility];  Service: Cardiovascular;  Laterality: [name];  . Cardiac catheterization [name] 06/22/2015    Procedure: Coronary Stent Intervention;  Surgeon: Jettie Booze, MD;  Location: [facility];  Service: Cardiovascular;  Laterality: [name];  . Cardiac catheterization [name] 06/24/2015    Procedure: Left Heart Cath and Coronary Angiography;  Surgeon: Troy Sine, MD;  Location: [facility];  Service: Cardiovascular;  Laterality: [name];  . Cardiac catheterization [name] 06/24/2015    Procedure: Coronary Stent Intervention;  Surgeon: Troy Sine, MD;  Location: [facility];  Service: Cardiovascular;  Laterality: [name];    Current Medications: Outpatient Prescriptions Prior to Visit  Medication Sig Dispense Refill  . ALPRAZolam (XANAX) 0.5 MG tablet Take 0.5 mg by mouth as needed for anxiety.    . AMBULATORY  NON FORMULARY MEDICATION Take 90 mg by mouth 2 (two) times daily. Medication Name: Brilinta 90 mg BID (TWILIGHT Research Study provided do NOT fill)    . AMBULATORY NON FORMULARY MEDICATION Take 81 mg by mouth daily. Medication Name: ASA 81 mg Daily (TWILIGHT Research study provided)    . ezetimibe (ZETIA) 10 MG tablet Take 1 tablet (10 mg total) by mouth daily. 30 tablet 11  . latanoprost (XALATAN) 0.005 % ophthalmic solution Place 1 drop into both eyes daily.    Marland Kitchen omeprazole (PRILOSEC) 20 MG capsule Take 20 mg by mouth daily.     . timolol (TIMOPTIC) 0.5 % ophthalmic solution Place 1 drop into both eyes daily.     No facility-administered medications prior to visit.     Allergies:   Other; Atorvastatin;  Rosuvastatin; Verapamil hcl er; and Imitrex   Social History   Social History  . Marital Status: Married    Spouse Name: [name]  . Number of Children: [name]  . Years of Education: [name]   Occupational History  . Retired    Social History Main Topics  . Smoking status: Never Smoker   . Smokeless tobacco: None  . Alcohol Use: No  . Drug Use: No  . Sexual Activity: Not Asked   Other Topics Concern  . None   Social History Narrative   Active, likes to walk and kayak. Lives with wife.     Family History:  The patient's family history includes CAD in his father and mother; Hypertension in his father. There is no history of Diabetes type II.   ROS:   Please see the history of present illness.    ROS All other systems reviewed and are negative.   Physical Exam:    VS:  BP 120/60 mmHg  Pulse 72  Ht 6\' 1"  (1.854 m)  Wt 193 lb 12.8 oz (87.907 kg)  BMI 25.57 kg/m2   GEN: Well nourished, well developed, in no acute distress HEENT: normal Neck: no JVD, no masses Cardiac: Normal S1/S2,  RRR; no murmurs, rubs, or gallops, no edema; right wrist without hematoma or mass    Respiratory:  clear to auscultation bilaterally; no wheezing, rhonchi or rales GI: soft, nontender, nondistended MS: no deformity or atrophy Skin: warm and dry Neuro: No focal deficits  Psych: Alert and oriented x 3, normal affect  Wt Readings from Last 3 Encounters:  07/09/15 193 lb 12.8 oz (87.907 kg)  06/25/15 193 lb 12.8 oz (87.907 kg)  10/02/07 191 lb 8 oz (86.864 kg)      Studies/Labs Reviewed:     EKG:  EKG is   ordered today.  The ekg ordered today demonstrates NSR, HR 73, normal axis, TWI 1, 2, 3, aVF, V2-6, septal Q waves, QTc 473 ms  Recent Labs: 06/20/2015: TSH 2.260 06/25/2015: BUN 18; Creatinine, Ser 1.32*; Hemoglobin 10.8*; Platelets 141*; Potassium 3.8; Sodium 141   Recent Lipid Panel    Component Value Date/Time   CHOL 192 06/20/2015 0658   TRIG 71 06/20/2015 0658   HDL 44 06/20/2015  0658   CHOLHDL 4.4 06/20/2015 0658   VLDL 14 06/20/2015 0658   LDLCALC 134* 06/20/2015 0658   LDLDIRECT 173.8 10/03/2007 0721    Additional studies/ records that were reviewed today include:   LHC 06/24/15 LAD stents patent LCx mid 40%, OM1 85% RCA prox 30%, mid 20% PCI: 3 x 16 mm Synergy DES to OM1  Echo 06/23/15 EF 35-40%, ant-septal, inferoseptal, apical AK, Gr 1 DD, trivial  AI  LHC 06/22/15 LAD mid 99% LCX OM1 80% RCA mild disease PCI: 2.5 x 38 mm and 3 x 16 mm Synergy DES to LAD   ASSESSMENT:     1. Coronary artery disease involving native coronary artery of native heart without angina pectoris   2. Ischemic cardiomyopathy   3. Cough   4. Hyperlipidemia     PLAN:     In order of problems listed above:  1. CAD - s/p NSTEMI tx with DES x 2 LAD and staged PCI with DES to OM1.  He is having a cough as outlined that is fairly chronic is getting worse.  He has had some L sided chest pain.  His symptoms sound unrelated to this CAD.  He is not having any anginal symptoms.  I reviewed his case with Dr. Liam Rogers who agreed.  We discussed the importance of dual antiplatelet therapy. His BP and HR seem to be good enough to tolerate low dose beta-blocker therapy now.   -  Continue ASA, Brilinta  -  He is intol to statins.  Continue Zetia.  -  Start Coreg 3.126 mg bid.   -  Refer to Cardiac Rehab.   2. Ischemic CM - No significant signs of acute CHF.  Lungs are clear and neck veins are flat.  Will get CXR from PCP.  Will see what his CT shows.  Start beta-blocker as noted.  Would avoid ACE inhibitor with his cough.  Eventually try to add angiotensin receptor blocker with Losartan 25 QD.  Will need repeat Echo 90 days post MI.  If EF < 35% at that time, will need referral to EP for ICD.  3. Cough - By his report, his CXR today showed a L effusion. I suspect that whatever pulmonary process is going on, this is the reason for his L chest pain.  I will ask for the CXR report.  He has  a CTA of the chest and abd CT with oral contrast pending this afternoon.  Will await these results.  If there is evidence of pulmonary edema, will need to start Lasix.  Otherwise FU with PCP.  -  Addendum 07/09/2015 1:33 PM >> CXR received from PCP.  CXR 07/09/15: L base ATX vs infiltrate, small bilat pleural effusions (L > R), mild CM, no pulmonary vasc congestion. Will await CT results.    4. HL - Intol to statins.  Continue Zetia.  Could consider referral to the [facility] for PCSK-9 inhibitor Rx at some point.    Medication Adjustments/Labs and Tests Ordered: Current medicines are reviewed at length with the patient today.  Concerns regarding medicines are outlined above.  Medication changes, Labs and Tests ordered today are outlined in the Patient Instructions noted below. Patient Instructions  Medication Instructions:  1. START COREG 3.125 MG TWICE DAILY; RX   Labwork: NONE  Testing/Procedures: NONE  Follow-Up: 07/24/15 @ 8:50 WITH Amanda Pote, PAC  Any Other Special Instructions Will Be Listed Below (If Applicable).  If you need a refill on your cardiac medications before your next appointment, please call your pharmacy.   Signed, Richardson Dopp, PA-C  07/09/2015 1:21 PM    [facility] [address], [place], [place]  [address] Phone: [phone]; Fax: [phone]

## 2015-07-09 ENCOUNTER — Ambulatory Visit (HOSPITAL_BASED_OUTPATIENT_CLINIC_OR_DEPARTMENT_OTHER)
Admission: RE | Admit: 2015-07-09 | Discharge: 2015-07-09 | Disposition: A | Discharge status: Home / Self Care | Payer: PPO | Source: Ambulatory Visit | Attending: Family Medicine | Admitting: Family Medicine

## 2015-07-09 ENCOUNTER — Ambulatory Visit (INDEPENDENT_AMBULATORY_CARE_PROVIDER_SITE_OTHER): Payer: PPO | Admitting: Physician Assistant

## 2015-07-09 ENCOUNTER — Encounter: Payer: Self-pay | Admitting: Physician Assistant

## 2015-07-09 ENCOUNTER — Other Ambulatory Visit (HOSPITAL_BASED_OUTPATIENT_CLINIC_OR_DEPARTMENT_OTHER): Payer: Self-pay | Admitting: Family Medicine

## 2015-07-09 ENCOUNTER — Encounter (HOSPITAL_BASED_OUTPATIENT_CLINIC_OR_DEPARTMENT_OTHER): Payer: Self-pay

## 2015-07-09 VITALS — BP 120/60 | HR 72 | Ht 73.0 in | Wt 193.8 lb

## 2015-07-09 DIAGNOSIS — E785 Hyperlipidemia, unspecified: Secondary | ICD-10-CM | POA: Diagnosis not present

## 2015-07-09 DIAGNOSIS — J90 Pleural effusion, not elsewhere classified: Secondary | ICD-10-CM

## 2015-07-09 DIAGNOSIS — J948 Other specified pleural conditions: Secondary | ICD-10-CM | POA: Diagnosis not present

## 2015-07-09 DIAGNOSIS — I313 Pericardial effusion (noninflammatory): Secondary | ICD-10-CM | POA: Insufficient documentation

## 2015-07-09 DIAGNOSIS — I319 Disease of pericardium, unspecified: Secondary | ICD-10-CM | POA: Diagnosis not present

## 2015-07-09 DIAGNOSIS — R140 Abdominal distension (gaseous): Secondary | ICD-10-CM

## 2015-07-09 DIAGNOSIS — N320 Bladder-neck obstruction: Secondary | ICD-10-CM | POA: Insufficient documentation

## 2015-07-09 DIAGNOSIS — R05 Cough: Secondary | ICD-10-CM | POA: Diagnosis not present

## 2015-07-09 DIAGNOSIS — R059 Cough, unspecified: Secondary | ICD-10-CM

## 2015-07-09 DIAGNOSIS — I255 Ischemic cardiomyopathy: Secondary | ICD-10-CM | POA: Insufficient documentation

## 2015-07-09 DIAGNOSIS — I252 Old myocardial infarction: Secondary | ICD-10-CM | POA: Diagnosis not present

## 2015-07-09 DIAGNOSIS — I2510 Atherosclerotic heart disease of native coronary artery without angina pectoris: Secondary | ICD-10-CM

## 2015-07-09 DIAGNOSIS — N400 Enlarged prostate without lower urinary tract symptoms: Secondary | ICD-10-CM | POA: Insufficient documentation

## 2015-07-09 MED ORDER — CARVEDILOL 3.125 MG PO TABS: 3.1250 mg | ORAL_TABLET | Freq: Two times a day (BID) | ORAL | Status: DC

## 2015-07-09 MED ORDER — IOPAMIDOL (ISOVUE-300) INJECTION 61%
100.0000 mL | Freq: Once | INTRAVENOUS | Status: AC | PRN
  Administered 2015-07-09: 100 mL via INTRAVENOUS

## 2015-07-09 NOTE — Patient Instructions (Addendum)
Medication Instructions:  1. START COREG 3.125 MG TWICE DAILY; RX   Labwork: NONE  Testing/Procedures: NONE  Follow-Up: 07/24/15 @ 8:50 WITH SCOTT WEAVER, PAC  Any Other Special Instructions Will Be Listed Below (If Applicable).  If you need a refill on your cardiac medications before your next appointment, please call your pharmacy.

## 2015-07-13 ENCOUNTER — Telehealth: Payer: Self-pay | Admitting: Cardiovascular Disease

## 2015-07-13 ENCOUNTER — Telehealth: Payer: Self-pay | Admitting: Physician Assistant

## 2015-07-13 DIAGNOSIS — I241 Dressler's syndrome: Secondary | ICD-10-CM

## 2015-07-13 MED ORDER — COLCHICINE 0.6 MG PO TABS: 0.6000 mg | ORAL_TABLET | Freq: Every day | ORAL | Status: DC

## 2015-07-13 MED ORDER — METOPROLOL SUCCINATE ER 25 MG PO TB24: 12.5000 mg | ORAL_TABLET | Freq: Every day | ORAL | Status: DC

## 2015-07-13 NOTE — Telephone Encounter (Signed)
Reviewed CT done by PCP There is some evidence of inflammation of the pericardium usually seen with patients post MI. I would like him to take Colchicine 0.6 mg QD x 30 days, then stop. Sent to his pharmacy. Richardson Dopp, PA-C   07/13/2015 5:45 PM

## 2015-07-13 NOTE — Telephone Encounter (Signed)
New message     *STAT* If patient is at the pharmacy, call can be transferred to refill team.   1. Which medications need to be refilled? (please list name of each medication and dose if known) metoprolol  25 mg   2. Which pharmacy/location (including street and city if local pharmacy) is medication to be sent to?[facility] on [address]   3. Do they need a 30 day or 90 day supply? 90 days supply

## 2015-07-13 NOTE — Telephone Encounter (Signed)
**Note De-Identified Ammy Lienhard Obfuscation** Toprol 2.5 mg #30 with 6 refills has been sent to [facility] in [place].

## 2015-07-13 NOTE — Telephone Encounter (Signed)
**Note De-Identified Lucyann Romano Obfuscation** Per Richardson Dopp, PA-c the pt is advised to stop taking Coreg and if/when he feels better to start taking Toprol XL 12.5 mg daily. The pt verbalized understanding and is in agreement with the plan. Per his request I have sent RX for Toprol XL 12.5 mg #30 with 6 refills to [facility] on [address] to fill.

## 2015-07-13 NOTE — Telephone Encounter (Signed)
The pt states that since he started taking Coreg 3.125 mg BID at his last OV with Richardson Dopp, PA-c on 3/30 he has had the following problems:  Dizziness when standing up Cant urinate Stomach swelling and bloating Little to no bowel movement SOB Feels cold  He wants to know if these s/s are due to Coreg.  He is advised that I will discuss with Dr Acie Fredrickson and call him back. He verbalized understanding.

## 2015-07-13 NOTE — Telephone Encounter (Signed)
New message     Pt c/o medication issue:  1. Name of Medication: coreg 2. How are you currently taking this medication (dosage and times per day)? 3.125 3. Are you having a reaction (difficulty breathing--STAT)? no  4. What is your medication issue? Since starting medication, pt is constipated, dizzy and having trouble peeing.  Please call

## 2015-07-13 NOTE — Telephone Encounter (Signed)
Rx already sent in today as below: Medication Detail      Disp Refills Start End     metoprolol succinate (TOPROL XL) 25 MG 24 hr tablet 30 tablet 6 07/13/2015     Sig - Route: Take 0.5 tablets (12.5 mg total) by mouth daily. - Oral    Notes to Pharmacy: To replace Coreg    E-Prescribing Status: Receipt confirmed by pharmacy (07/13/2015 3:28 PM EDT)     Pharmacy    [facility] [address], [place]

## 2015-07-13 NOTE — Telephone Encounter (Signed)
New message      Please call in presc to [facility] in [place]----------- different pharmacy

## 2015-07-14 NOTE — Telephone Encounter (Signed)
I s/w pt about CT results and went over recommendations from [place] PA to start Colchicine 0.6 mg BID x 30 days then d/c. Pt said he already s/w someone from our office in regards to CT results and was told that we did not need to do anything right now. I advised pt that I am not sure if Nicki Reaper was aware of this however I will be sure to go over this with him and cb later if he still wants him to start Colchicine or not. Pt said ok and thank you.

## 2015-07-14 NOTE — Telephone Encounter (Signed)
Colchicine 0.6 mg once daily for 30 days to help with pain from post MI pericarditis.  If his chest symptoms are all resolved, he does not need to take Colchicine. Richardson Dopp, PA-C   07/14/2015 5:54 PM

## 2015-07-14 NOTE — Telephone Encounter (Signed)
Pt made aware since he stated earlier today that he is not having any cp, sob, or coughing he does not need to start Colchicine as ordered per Brynda Rim. PA. Pt does state though he has been having night sweats since he was in the hospital and was started on Brilinta. I did advise pt to also call PCP to see what they say about night sweats, however I will d/w PA on Thursday when he is back in the office. Pt also stated he would like to see if he can start seeing Dr. Percival Spanish in the [place] office since it is closer to his home as well as Dr. Percival Spanish treated his father before he passed and also treats his brother's. I did tell pt that I did not think it was a problem though we have to follow a protocol of d/w the doctor's involved and get the ok to change dr. Pt cardiologist is Dr. Acie Fredrickson though he has never seen Dr. Acie Fredrickson yet.

## 2015-07-15 NOTE — Telephone Encounter (Signed)
I am not aware of any reports of night sweats with Brilinta. He can have a few sips of Coca Cola or Pepsi after he takes the medication to see if this helps. Agree with FU with PCP as well. If symptoms continue beyond 30 days post MI, we can consider changing Brilinta. Ok to FU with Dr. Percival Spanish in [place]. He may have to keep FU here with me later this month unless Dr. Percival Spanish has an opening around that time. Richardson Dopp, PA-C   07/15/2015 8:24 AM

## 2015-07-16 ENCOUNTER — Telehealth (HOSPITAL_COMMUNITY): Payer: Self-pay | Admitting: *Deleted

## 2015-07-17 NOTE — Telephone Encounter (Signed)
I s/w pt today and went over recommendation from [place] to try drinking some pepsi or coca cola after taking Brilinta to see if this helps w/the night sweats. Also advised if this continue beyond 30 days post MI that we may need to change Brilinta. Pt also aware ok to change over to Dr. Percival Spanish in our [place] office, which is closer for the pt. Pt aware to keep appt with Brynda Rim. PA 4/14 @ 8:50. Pt agreeable to plan of care.

## 2015-07-21 ENCOUNTER — Telehealth: Payer: Self-pay | Admitting: Cardiovascular Disease

## 2015-07-21 NOTE — Telephone Encounter (Signed)
I called and left a voicemail for Crystal with [facility] management to contact the office to discuss pt.

## 2015-07-21 NOTE — Telephone Encounter (Signed)
New message   [facility] is calling for a triage rn  She did not want to tell me what it was for   She stated: (she is leaving soon and would rather speak to rn than wait for a returned call)  I told her in order to send a message I would need to know why it needs to go to triage so the Rn can address the concerns   Pt picked up Prescription for Toprol and is refusing to take it  He has stopped his Zeta  Concerned about when his cardiac rehab will start  She is requesting a call back

## 2015-07-23 NOTE — Progress Notes (Signed)
Cardiology Office Note:    Date:  07/24/2015   ID:  Trevor Branch, DOB November 17, 1945, MRN [id number]  PCP:  Orpah Melter, MD  Cardiologist:  Dr. Liam Rogers  >> requests Dr. Minus Breeding (lives in [place]) Electrophysiologist:  Dr. Virl Axe (2007)  Chief Complaint  Patient presents with  . Coronary Artery Disease    follow up    History of Present Illness:     Trevor Branch is a 70 y.o. male with a hx of SVT s/p RFCA, RBBB.    Admitted 3/16 with NSTEMI.  LHC demonstrated OM1 80%, mid LAD 99%.  He underwent PCI with DES x 2 to the mLAD. Echo confirmed ischemic CM with EF 35-40% with ant-septal, inf-septal and apical wall motion abnormalities.  He returned to the Cath Lab 3/15 and underwent staged PCI of the OM1 with a Synergy DES.  LVEDP 17-19 mmHg.  No beta-blocker was started due to bradycardia.  He was not put on ACE inhibitor due to low BP.  He is allergic to statins and was put on Zetia.    I saw him 07/09/15 in follow-up. He complained of a chronic cough since July 2016. He had been on several rounds of antibiotics. He noted abdominal bloating. He felt that his cough had gotten worse after DC from his MI. He also noted pleuritic left-sided chest pain worse with positional changes. It was not like his previous angina. His primary care physician had done a chest x-ray prior to his appointment with me. There was apparently a suspicion for pleural effusion. CT scan was pending when I saw him. His heart rate was improved and after review with Dr. Acie Fredrickson, we decided to place him on carvedilol 3.125 mg twice daily.   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 He had trace right and small left pleural effusions and no significant abdominal findings. We called the patient and tried to place him on colchicine to treat his Dressler's syndrome. However, he noted significant improvement in his symptoms and  declined therapy. He also had side effects to carvedilol and I changed him to metoprolol succinate.  Returns for follow-up.  Here with his wife. Since last seen, he is feeling much better. Denies chest discomfort. Denies shortness of breath. Denies syncope. Denies orthopnea, PND or edema. Cough is persistent. PCP is referring him to pulmonology. Of note, he stopped taking carvedilol secondary to significant fatigue.   Past Medical History  Diagnosis Date  . GERD (gastroesophageal reflux disease)   . SVT (supraventricular tachycardia) (HCC)     s/p prior RFCA  . PTSD (post-traumatic stress disorder)     panic attacks  . CAD (coronary artery disease)     a. NSTEMI 3/17 - s/p DES x 2 to mid LAD; staged PCI with DES to OM1; residual CAD - mLCX 40%, pRCA 30%, mRCA 20%   . History of non-ST elevation myocardial infarction (NSTEMI) 06/2015  . Ischemic cardiomyopathy     a. Echo 06/23/15 - EF 35-40%, ant-septal, inferoseptal, apical AK, Gr 1 DD, trivial AI    Past Surgical History  Procedure Laterality Date  . Supraventricular tachycardia ablation  2009    at [facility]  . Cardiac catheterization [name] 06/22/2015    Procedure: Left Heart Cath and Coronary Angiography;  Surgeon: Jettie Booze, MD;  Location: [facility];  Service: Cardiovascular;  Laterality: [name];  . Cardiac catheterization [name] 06/22/2015  Procedure: Coronary Stent Intervention;  Surgeon: Jettie Booze, MD;  Location: [facility];  Service: Cardiovascular;  Laterality: [name];  . Cardiac catheterization [name] 06/24/2015    Procedure: Left Heart Cath and Coronary Angiography;  Surgeon: Troy Sine, MD;  Location: [facility];  Service: Cardiovascular;  Laterality: [name];  . Cardiac catheterization [name] 06/24/2015    Procedure: Coronary Stent Intervention;  Surgeon: Troy Sine, MD;  Location: [facility];  Service: Cardiovascular;  Laterality: [name];    Current Medications: Outpatient Prescriptions  Prior to Visit  Medication Sig Dispense Refill  . ALPRAZolam (XANAX) 0.5 MG tablet Take 0.5 mg by mouth as needed for anxiety.    . AMBULATORY NON FORMULARY MEDICATION Take 90 mg by mouth 2 (two) times daily. Medication Name: Brilinta 90 mg BID (TWILIGHT Research Study provided do NOT fill)    . AMBULATORY NON FORMULARY MEDICATION Take 81 mg by mouth daily. Medication Name: ASA 81 mg Daily (TWILIGHT Research study provided)    . latanoprost (XALATAN) 0.005 % ophthalmic solution Place 1 drop into both eyes daily.    Marland Kitchen omeprazole (PRILOSEC) 20 MG capsule Take 20 mg by mouth daily.     . timolol (TIMOPTIC) 0.5 % ophthalmic solution Place 1 drop into both eyes daily.    . colchicine 0.6 MG tablet Take 1 tablet (0.6 mg total) by mouth daily. (Patient not taking: Reported on 07/24/2015) 30 tablet 1  . ezetimibe (ZETIA) 10 MG tablet Take 1 tablet (10 mg total) by mouth daily. (Patient not taking: Reported on 07/24/2015) 30 tablet 11  . Hydrocodone-Chlorpheniramine 5-4 MG/5ML SOLN Take 5 mg by mouth 2 (two) times daily. Reported on 07/24/2015    . metoprolol succinate (TOPROL XL) 25 MG 24 hr tablet Take 0.5 tablets (12.5 mg total) by mouth daily. (Patient not taking: Reported on 07/24/2015) 30 tablet 6   No facility-administered medications prior to visit.     Allergies:   Other; Atorvastatin; Rosuvastatin; Verapamil hcl er; and Imitrex   Social History   Social History  . Marital Status: Married    Spouse Name: [name]  . Number of Children: [name]  . Years of Education: [name]   Occupational History  . Retired    Social History Main Topics  . Smoking status: Never Smoker   . Smokeless tobacco: None  . Alcohol Use: No  . Drug Use: No  . Sexual Activity: Not Asked   Other Topics Concern  . None   Social History Narrative   Active, likes to walk and kayak. Lives with wife.     Family History:  The patient's family history includes CAD in his father and mother; Hypertension in his father. There is  no history of Diabetes type II.   ROS:   Please see the history of present illness.    ROS All other systems reviewed and are negative.   Physical Exam:    VS:  BP 118/58 mmHg  Pulse 58  Ht 6\' 1"  (1.854 m)  Wt 193 lb 12.8 oz (87.907 kg)  BMI 25.57 kg/m2   GEN: Well nourished, well developed, in no acute distress HEENT: normal Neck: no JVD, no masses Cardiac: Normal S1/S2,  RRR; no murmurs, rubs, or gallops, no edema    Respiratory:  clear to auscultation bilaterally; no wheezing, rhonchi or rales GI: soft, nontender, nondistended MS: no deformity or atrophy Skin: warm and dry Neuro: No focal deficits  Psych: Alert and oriented x 3, normal affect  Wt Readings from Last 3 Encounters:  07/24/15 193 lb 12.8 oz (87.907 kg)  07/09/15 193 lb 12.8 oz (87.907 kg)  06/25/15 193 lb 12.8 oz (87.907 kg)      Studies/Labs Reviewed:     EKG:  EKG is   ordered today.  The ekg ordered today demonstrates NSR, HR 59, normal axis, TWI 1, aVL, 2, V2-V6, septal Q waves, QTc 433 ms  Recent Labs: 06/20/2015: TSH 2.260 06/25/2015: BUN 18; Creatinine, Ser 1.32*; Hemoglobin 10.8*; Platelets 141*; Potassium 3.8; Sodium 141   Recent Lipid Panel    Component Value Date/Time   CHOL 192 06/20/2015 0658   TRIG 71 06/20/2015 0658   HDL 44 06/20/2015 0658   CHOLHDL 4.4 06/20/2015 0658   VLDL 14 06/20/2015 0658   LDLCALC 134* 06/20/2015 0658   LDLDIRECT 173.8 10/03/2007 0721    Additional studies/ records that were reviewed today include:   Ct Chest/Abd/Pelvis W Contrast  07/10/2015   IMPRESSION: 1. Pericarditis with small pericardial effusion. Given recent myocardial infarction question Dressler's syndrome. 2. Trace right and small left pleural effusions. 3. No explanation for abdominal distention. 4. Prostatomegaly and chronic bladder outlet obstruction Electronically Signed   By: Monte Fantasia M.D.   On: 07/10/2015 08:12   LHC 06/24/15 LAD stents patent LCx mid 40%, OM1 85% RCA prox 30%, mid  20% PCI: 3 x 16 mm Synergy DES to OM1  Echo 06/23/15 EF 35-40%, ant-septal, inferoseptal, apical AK, Gr 1 DD, trivial AI  LHC 06/22/15 LAD mid 99% LCX OM1 80% RCA mild disease PCI: 2.5 x 38 mm and 3 x 16 mm Synergy DES to LAD   ASSESSMENT:     1. Coronary artery disease involving native coronary artery of native heart without angina pectoris   2. Ischemic cardiomyopathy   3. Cough   4. Hyperlipidemia     PLAN:     In order of problems listed above:  1. CAD - s/p NSTEMI tx with DES x 2 LAD and staged PCI with DES to OM1.  He had evidence of Dressler's syndrome on recent CT.  As noted, his symptoms of L CP were resolved and he declined Colchicine Rx.  Overall, he is feeling better.  -  Continue ASA, Brilinta  -  He is intol to statins.  He also stopped Zetia secondary to intolerance  -  He has not been able to tolerate beta blockers.  -  Check on referral to Cardiac Rehab.   2. Ischemic CM - As noted, he could not tolerate beta blocker therapy. Blood pressures running low and I am not certain he will be out of tolerate ACE inhibitor or ARB. I will obtain a limited echo 6 weeks post MI. If his EF has recovered to normal, will hold off on pushing to start ACE inhibitor or ARB. If his ejection fraction remains reduced, we'll try to place him on lowest dose possible of ACE inhibitor/ARB.  3. Cough - Persistent. He has been referred to pulmonology.  4. HL - Intol to statins and Zetia. Refer to the [facility] for PCSK-9 inhibitor Rx.    Medication Adjustments/Labs and Tests Ordered: Current medicines are reviewed at length with the patient today.  Concerns regarding medicines are outlined above.  Medication changes, Labs and Tests ordered today are outlined in the Patient Instructions noted below. Patient Instructions  Medication Instructions:  1. Your physician recommends that you continue on your current medications as directed. Please refer to the Current Medication list given  to you today. Labwork: NONE Testing/Procedures: 1. Your physician has requested that you have an echocardiogram. THIS IS TO BE DONE SOMETIME THE 1ST WEEK OF MAY 2017. Echocardiography is a painless test that uses sound waves to create images of your heart. It provides your doctor with information about the size and shape of your heart and how well your heart's chambers and valves are working. This procedure takes approximately one hour. There are no restrictions for this procedure. Follow-Up: KEEP YOUR APPT WITH DR. HOCHREIN 10/07/15 Any Other Special Instructions Will Be Listed Below (If Applicable). YOU ARE BEING REFERRED TO [facility] If you need a refill on your cardiac medications before your next appointment, please call your pharmacy.   Signed, Richardson Dopp, PA-C  07/24/2015 12:45 PM    [facility] [place], [place], [place]  [address] Phone: [phone]; Fax: [phone]

## 2015-07-24 ENCOUNTER — Encounter: Payer: Self-pay | Admitting: Physician Assistant

## 2015-07-24 ENCOUNTER — Ambulatory Visit (INDEPENDENT_AMBULATORY_CARE_PROVIDER_SITE_OTHER): Payer: PPO | Admitting: Physician Assistant

## 2015-07-24 VITALS — BP 118/58 | HR 58 | Ht 73.0 in | Wt 193.8 lb

## 2015-07-24 DIAGNOSIS — R059 Cough, unspecified: Secondary | ICD-10-CM

## 2015-07-24 DIAGNOSIS — I255 Ischemic cardiomyopathy: Secondary | ICD-10-CM | POA: Diagnosis not present

## 2015-07-24 DIAGNOSIS — R05 Cough: Secondary | ICD-10-CM | POA: Diagnosis not present

## 2015-07-24 DIAGNOSIS — I2510 Atherosclerotic heart disease of native coronary artery without angina pectoris: Secondary | ICD-10-CM | POA: Diagnosis not present

## 2015-07-24 DIAGNOSIS — E785 Hyperlipidemia, unspecified: Secondary | ICD-10-CM

## 2015-07-24 NOTE — Patient Instructions (Addendum)
Medication Instructions:  1. Your physician recommends that you continue on your current medications as directed. Please refer to the Current Medication list given to you today. Labwork: NONE Testing/Procedures: 1. Your physician has requested that you have an echocardiogram. THIS IS TO BE DONE SOMETIME THE 1ST WEEK OF MAY 2017. Echocardiography is a painless test that uses sound waves to create images of your heart. It provides your doctor with information about the size and shape of your heart and how well your heart's chambers and valves are working. This procedure takes approximately one hour. There are no restrictions for this procedure. Follow-Up: KEEP YOUR APPT WITH DR. HOCHREIN 10/07/15 Any Other Special Instructions Will Be Listed Below (If Applicable). YOU ARE BEING REFERRED TO [facility] If you need a refill on your cardiac medications before your next appointment, please call your pharmacy.

## 2015-07-27 NOTE — Telephone Encounter (Signed)
Will close encounter.  Pt was seen for office visit on 07/24/15. Issues were addressed at that time.

## 2015-07-28 ENCOUNTER — Telehealth: Payer: Self-pay | Admitting: *Deleted

## 2015-07-28 NOTE — Telephone Encounter (Signed)
Left message for patient to call research office to complete 1 month TWILIGHT Research telephone call.

## 2015-07-31 ENCOUNTER — Encounter: Payer: Self-pay | Admitting: *Deleted

## 2015-07-31 DIAGNOSIS — Z006 Encounter for examination for normal comparison and control in clinical research program: Secondary | ICD-10-CM

## 2015-08-03 ENCOUNTER — Telehealth: Payer: Self-pay | Admitting: Cardiology

## 2015-08-03 DIAGNOSIS — I2510 Atherosclerotic heart disease of native coronary artery without angina pectoris: Secondary | ICD-10-CM

## 2015-08-03 DIAGNOSIS — I214 Non-ST elevation (NSTEMI) myocardial infarction: Secondary | ICD-10-CM

## 2015-08-03 NOTE — Telephone Encounter (Signed)
Trevor Branch is calling because [employer] is still saying he is not cleared for Cardiac Rehab , and that they have no order for it .  Please call   Thanks

## 2015-08-03 NOTE — Progress Notes (Signed)
TWILIGHT Research study 1 month follow up, patient returned phone call, Patient denies any bleeding or adverse events. Patients states 100 5 compliant with brilinta and ASA. Next research visit 09/14/15. Questions encouraged and answered.

## 2015-08-04 NOTE — Telephone Encounter (Signed)
Spoke with pt, he stated he spoke with Richardson Dopp and he was to put in an order for Cardiac Rehab and will like to know the update on that order.

## 2015-08-04 NOTE — Telephone Encounter (Signed)
May need to check with his insurance to see if they are denying for some reason. We can send copies of DC summary and my OV note to insurance if they need that. Richardson Dopp, PA-C   08/04/2015 12:54 PM

## 2015-08-04 NOTE — Telephone Encounter (Signed)
I s/w pt which I had stated to pt the referral for cardiac rehab was placed in system 4/13. I stated to pt that I will place another order today. I apologized to pt for whatever happened the delay, however I placed a new order today. Pt said thank you.

## 2015-08-04 NOTE — Telephone Encounter (Signed)
Cardiac Rehab referral was placed in system 4/13. Not sure why pt has not heard yet from Cardiac Rehab, though referral was placed 07/23/15.

## 2015-08-06 ENCOUNTER — Ambulatory Visit (INDEPENDENT_AMBULATORY_CARE_PROVIDER_SITE_OTHER): Payer: PPO | Admitting: Internal Medicine

## 2015-08-06 ENCOUNTER — Other Ambulatory Visit (INDEPENDENT_AMBULATORY_CARE_PROVIDER_SITE_OTHER): Payer: PPO

## 2015-08-06 ENCOUNTER — Encounter: Payer: Self-pay | Admitting: Internal Medicine

## 2015-08-06 VITALS — BP 110/70 | HR 63 | Ht 73.0 in | Wt 192.6 lb

## 2015-08-06 DIAGNOSIS — R059 Cough, unspecified: Secondary | ICD-10-CM

## 2015-08-06 DIAGNOSIS — R05 Cough: Secondary | ICD-10-CM

## 2015-08-06 LAB — CBC WITH DIFFERENTIAL/PLATELET
BASOS PCT: 0.6 % (ref 0.0–3.0)
Basophils Absolute: 0 10*3/uL (ref 0.0–0.1)
EOS ABS: 0.2 10*3/uL (ref 0.0–0.7)
Eosinophils Relative: 3.4 % (ref 0.0–5.0)
HEMATOCRIT: 40.9 % (ref 39.0–52.0)
Hemoglobin: 13.9 g/dL (ref 13.0–17.0)
LYMPHS PCT: 22.8 % (ref 12.0–46.0)
Lymphs Abs: 1.1 10*3/uL (ref 0.7–4.0)
MCHC: 34 g/dL (ref 30.0–36.0)
MCV: 92.1 fl (ref 78.0–100.0)
MONOS PCT: 10 % (ref 3.0–12.0)
Monocytes Absolute: 0.5 10*3/uL (ref 0.1–1.0)
NEUTROS ABS: 3 10*3/uL (ref 1.4–7.7)
NEUTROS PCT: 63.2 % (ref 43.0–77.0)
PLATELETS: 231 10*3/uL (ref 150.0–400.0)
RBC: 4.45 Mil/uL (ref 4.22–5.81)
RDW: 14.7 % (ref 11.5–15.5)
WBC: 4.8 10*3/uL (ref 4.0–10.5)

## 2015-08-06 LAB — SEDIMENTATION RATE: Sed Rate: 18 mm/hr (ref 0–22)

## 2015-08-06 MED ORDER — OMEPRAZOLE 40 MG PO CPDR: DELAYED_RELEASE_CAPSULE | ORAL | Status: DC

## 2015-08-06 NOTE — Patient Instructions (Signed)
For drainage / throat tickle try take CHLORPHENIRAMINE  4 mg - take one every 4 hours as needed - available over the counter- may cause drowsiness so start with just a bedtime dose or two and see how you tolerate it before trying in daytime    Omeprazole 40 Take 30- 60 min before your first and last meals of the day   GERD (REFLUX)  is an extremely common cause of respiratory symptoms just like yours , many times with no obvious heartburn at all.    It can be treated with medication, but also with lifestyle changes including elevation of the head of your bed (ideally with 6 inch  bed blocks),  Smoking cessation, avoidance of late meals, excessive alcohol, and avoid fatty foods, chocolate, peppermint, colas, red wine, and acidic juices such as orange juice.  NO MINT OR MENTHOL PRODUCTS SO NO COUGH DROPS  USE SUGARLESS CANDY INSTEAD (Jolley ranchers or Stover's or Life Savers) or even ice chips will also do - the key is to swallow to prevent all throat clearing. NO OIL BASED VITAMINS - use powdered substitutes   Please schedule a follow up office visit in 4 weeks, sooner if needed with all active medications in hand

## 2015-08-06 NOTE — Assessment & Plan Note (Addendum)
The most common causes of chronic cough in immunocompetent adults include the following: upper airway cough syndrome (UACS), previously referred to as postnasal drip syndrome (PNDS), which is caused by variety of rhinosinus conditions; (2) asthma(for which he is at risk from timoptic eyedrops and note he had trouble with coreg but does not sound like it was a respiratory side effect; (3) GERD; (4) chronic bronchitis from cigarette smoking or other inhaled environmental irritants; (5) nonasthmatic eosinophilic bronchitis; and (6) bronchiectasis.   These conditions, singly or in combination, have accounted for up to 94% of the causes of chronic cough in prospective studies.   Other conditions have constituted no >6% of the causes in prospective studies These have included bronchogenic carcinoma, chronic interstitial pneumonia, sarcoidosis, left ventricular failure, ACEI-induced cough, and aspiration from a condition associated with pharyngeal dysfunction.    Chronic cough is often simultaneously caused by more than one condition. A single cause has been found from 38 to 82% of the time, multiple causes from 18 to 62%. Multiply caused cough has been the result of three diseases up to 42% of the time.       Based on hx and exam, this is most likely:  Classic Upper airway cough syndrome, so named because it's frequently impossible to sort out how much is  CR/sinusitis with freq throat clearing (which can be related to primary GERD)   vs  causing  secondary (" extra esophageal")  GERD from wide swings in gastric pressure that occur with throat clearing, often  promoting self use of mint and menthol lozenges that reduce the lower esophageal sphincter tone and exacerbate the problem further in a cyclical fashion.   These are the same pts (now being labeled as having "irritable larynx syndrome" by some cough centers) who not infrequently have a history of having failed to tolerate ace inhibitors,  dry powder  inhalers or biphosphonates or report having atypical reflux symptoms that don't respond to standard doses of PPI , and are easily confused as having aecopd or asthma flares by even experienced allergists/ pulmonologists.   The first step is to maximize all aspects of GERD Rx/ add 1st gen H1 per guidelines and  and eliminate cyclical coughing with hard rock candy  then regroup in 4 weeks with allergy profile in meantime   I had an extended discussion with the patient /wife reviewing all relevant studies completed to date and  lasting 35/45min intial evaluatin   1) Explained: The standardized cough guidelines published in Chest by Lissa Morales in 2006 are still the best available and consist of a multiple step process (up to 12!) , not a single office visit,  and are intended  to address this problem logically,  with an alogrithm dependent on response to empiric treatment at  each progressive step  to determine a specific diagnosis with  minimal addtional testing needed. Therefore if adherence is an issue or can't be accurately verified,  it's very unlikely the standard evaluation and treatment will be successful here.    Furthermore, response to therapy (other than acute cough suppression, which should only be used short term with avoidance of narcotic containing cough syrups if possible), can be a gradual process for which the patient may perceive immediate benefit.  Unlike going to an eye doctor where the best perscription is almost always the first one and is immediately effective, this is almost never the case in the management of chronic cough syndromes. Therefore the patient needs to commit up front  to consistently adhere to recommendations  for up to 6 weeks of therapy directed at the likely underlying problem(s) before the response can be reasonably evaluated.     2) Each maintenance medication was reviewed in detail including most importantly the difference between maintenance and prns and  under what circumstances the prns are to be triggered using an action plan format that is not reflected in the computer generated alphabetically organized AVS.    Please see instructions for details which were reviewed in writing and the patient given a copy highlighting the part that I personally wrote and discussed at today's ov.

## 2015-08-06 NOTE — Progress Notes (Signed)
Subjective:     Patient ID: Trevor Branch, male   DOB: 01/11/1946,    MRN: [id number]  HPI  28 yowm never smoker with h/o itchy /sneezing/runny nose > allergy shots x sev months > worse > seemed better p stopped and no need meds/doctors and did fine until around 2015/16 sensation of something stuck in throat with intermittent severe cough so referred to pulmonary clinic 08/06/2015 by Dr Doyle Askew    08/06/2015 1st [facility] office visit/ [facility]  On timoptic eyedrops no systemic BB  Chief Complaint  Patient presents with  . Pulmonary Consult    Referred by Dr. Christella Noa. Pt c/o cough for the past 2 yrs- worse since July 2016. Cough is prod with thick, clear sputum. Cough is esp worse at night and first thing in the am. He also c/o occ SOB- when he does too much exertion or gets stressed.   cough x 2 years, worse after supper and has to sleep in recliner/ immediate cough when lies flat  Worse p stents 06/2015  worse on coreg but never took ACEi  tessilon no  Better  / prednisone no better, worse with laughing / singing   No obvious day to day or daytime variability or assoc truly excess/ purulent sputum or mucus plugs   or cp or chest tightness, subjective wheeze or overt sinus or hb symptoms on ppi qd with Pos UGI for provoked reflux in 2005  . No unusual exp hx or h/o childhood pna/ asthma or knowledge of premature birth.  Sleeping ok without nocturnal  or early am exacerbation  of respiratory  c/o's or need for noct saba. Also denies any obvious fluctuation of symptoms with weather or environmental changes or other aggravating or alleviating factors except as outlined above   Current Medications, Allergies, Complete Past Medical History, Past Surgical History, Family History, and Social History were reviewed in [employer] record.  ROS  The following are not active complaints unless bolded sore throat, dysphagia, dental problems, itching, sneezing,  nasal congestion  or excess/ purulent secretions, ear ache,   fever, chills, sweats, unintended wt loss, classically pleuritic or exertional cp, hemoptysis,  orthopnea pnd or leg swelling, presyncope, palpitations, abdominal pain, anorexia, nausea, vomiting, diarrhea  or change in bowel or bladder habits, change in stools or urine, dysuria,hematuria,  rash, arthralgias, visual complaints, headache, numbness, weakness or ataxia or problems with walking or coordination,  change in mood/affect or memory.       Review of Systems     Objective:   Physical Exam     Wt Readings from Last 3 Encounters:  08/06/15 192 lb 9.6 oz (87.363 kg)  07/24/15 193 lb 12.8 oz (87.907 kg)  07/09/15 193 lb 12.8 oz (87.907 kg)    Vital signs reviewed        HEENT: nl dentition, turbinates, and oropharynx. Nl external ear canals without cough reflex   NECK :  without JVD/Nodes/TM/ nl carotid upstrokes bilaterally   LUNGS: no acc muscle use,  Nl contour chest which is clear to A and P bilaterally without cough on insp or exp maneuvers   CV:  RRR  no s3 or murmur or increase in P2, no edema   ABD:  soft and nontender with nl inspiratory excursion in the supine position. No bruits or organomegaly, bowel sounds nl  MS:  Nl gait/ ext warm without deformities, calf tenderness, cyanosis or clubbing No obvious joint restrictions   SKIN: warm and dry  without lesions    NEURO:  alert, approp, nl sensorium with  no motor deficits    I personally reviewed images and agree with radiology impression as follows:  CT Chest  With contrast 07/10/15 1. Pericarditis with small pericardial effusion. Given recent myocardial infarction question Dressler's syndrome. 2. Trace right and small left pleural effusions.   Labs ordered 08/06/2015 /  images reviewed include:  Allergy profile    Assessment:

## 2015-08-07 LAB — RESPIRATORY ALLERGY PROFILE REGION II ~~LOC~~
ALLERGEN, OAK, T7: 2.44 kU/L — AB
Allergen, Cedar tree, t12: 4 kU/L — ABNORMAL HIGH
Allergen, Comm Silver Birch, t9: 2.86 kU/L — ABNORMAL HIGH
Allergen, Mouse Urine Protein, e78: <0.1 kU/L
Alternaria Alternata: <0.1 kU/L
Aspergillus fumigatus, m3: <0.1 kU/L
BOX ELDER: 0.28 kU/L — AB
Cockroach: <0.1 kU/L
Common Ragweed: <0.1 kU/L
IGE (IMMUNOGLOBULIN E), SERUM: 48 kU/L (ref <115)
Pecan/Hickory Tree IgE: 0.13 kU/L — ABNORMAL HIGH
Penicillium Notatum: <0.1 kU/L
Rough Pigweed  IgE: <0.1 kU/L
Timothy Grass: <0.1 kU/L

## 2015-08-11 ENCOUNTER — Other Ambulatory Visit: Payer: Self-pay

## 2015-08-11 ENCOUNTER — Ambulatory Visit (INDEPENDENT_AMBULATORY_CARE_PROVIDER_SITE_OTHER): Payer: PPO | Admitting: Pharmacist

## 2015-08-11 ENCOUNTER — Ambulatory Visit (HOSPITAL_COMMUNITY): Payer: PPO | Attending: Internal Medicine

## 2015-08-11 DIAGNOSIS — R002 Palpitations: Secondary | ICD-10-CM | POA: Diagnosis not present

## 2015-08-11 DIAGNOSIS — E785 Hyperlipidemia, unspecified: Secondary | ICD-10-CM | POA: Insufficient documentation

## 2015-08-11 DIAGNOSIS — R001 Bradycardia, unspecified: Secondary | ICD-10-CM | POA: Insufficient documentation

## 2015-08-11 DIAGNOSIS — I255 Ischemic cardiomyopathy: Secondary | ICD-10-CM | POA: Diagnosis not present

## 2015-08-11 DIAGNOSIS — I2510 Atherosclerotic heart disease of native coronary artery without angina pectoris: Secondary | ICD-10-CM | POA: Insufficient documentation

## 2015-08-11 DIAGNOSIS — F419 Anxiety disorder, unspecified: Secondary | ICD-10-CM | POA: Insufficient documentation

## 2015-08-11 NOTE — Progress Notes (Signed)
Patient ID: Trevor Branch                 DOB: 10-15-1945                    MRN: [id number]     HPI: Trevor Branch is a 70 y.o. male patient referred to lipid clinic by Richardson Dopp, PA.  He was admitted to [facility] from 3/11-3/16 for NSTEMI.  His LHC demonstrated OM1 80% and mid LAD 99% stenosis.  He underwent PCI with DES x 2 to the mLAD.  He is now being managed with aggressive medical treatment.  He has been intolerant to beta-blockers due to fatigue and bradycardia.  NO ACEI/ARB has been used due to persistent cough and low BP.  He states he is intolerant to statins and Zetia was started during the hospitalization.  At his follow up visit on 4/14, he stated he was unable to tolerate Zetia and had stopped therapy.  At that time, he was referred to the lipid clinic for further evaluation.   Pt is here alone.  He states he has tried Crestor in the past but can't remember any other statins.  He complained of dizziness when he took Crestor.  His allergy list also contained Lipitor but pt does not think he ever took this.  He is adamant about not taking a statin due to side effects he has read about.   Current Medications: none Intolerances: Crestor (dizziness), Zetia 10mg  daily (constipation) Risk Factors: NSTEMI s/p PCI x 2 LDL goal: < 70 mg/dL, non-HDL goal < 100 mg/dL  Labs: 06/20/15- TC 192, TG 71, HDL 44, LDL 134 (on no therapy)  Past Medical History  Diagnosis Date  . GERD (gastroesophageal reflux disease)   . SVT (supraventricular tachycardia) (HCC)     s/p prior RFCA  . PTSD (post-traumatic stress disorder)     panic attacks  . CAD (coronary artery disease)     a. NSTEMI 3/17 - s/p DES x 2 to mid LAD; staged PCI with DES to OM1; residual CAD - mLCX 40%, pRCA 30%, mRCA 20%   . History of non-ST elevation myocardial infarction (NSTEMI) 06/2015  . Ischemic cardiomyopathy     a. Echo 06/23/15 - EF 35-40%, ant-septal, inferoseptal, apical AK, Gr 1 DD, trivial AI    Current  Outpatient Prescriptions on File Prior to Visit  Medication Sig Dispense Refill  . ALPRAZolam (XANAX) 0.5 MG tablet Take 0.5 mg by mouth as needed for anxiety.    . AMBULATORY NON FORMULARY MEDICATION Take 90 mg by mouth 2 (two) times daily. Medication Name: Brilinta 90 mg BID (TWILIGHT Research Study provided do NOT fill)    . AMBULATORY NON FORMULARY MEDICATION Take 81 mg by mouth daily. Medication Name: ASA 81 mg Daily (TWILIGHT Research study provided)    . latanoprost (XALATAN) 0.005 % ophthalmic solution Place 1 drop into both eyes daily.    Marland Kitchen omeprazole (PRILOSEC) 40 MG capsule Take 30- 60 min before your first and last meals of the day 60 60 capsule 11  . timolol (TIMOPTIC) 0.5 % ophthalmic solution Place 1 drop into both eyes daily.     No current facility-administered medications on file prior to visit.    Allergies  Allergen Reactions  . Other Other (See Comments)    Uncoded Allergy. Allergen: VERSED, Other Reaction: shaking Uncoded Allergy. Allergen: ECG adhesive pads, Other Reaction: skin redness and tearing  . Atorvastatin  REACTION: intolerant  . Doxycycline     GI upset  . Mucinex [Guaifenesin Er] Hives  . Rosuvastatin     REACTION: intolerant  . Verapamil Hcl Er   . Imitrex [Sumatriptan] Palpitations    Assessment/Plan: 1.  Hyperlipidemia- Pt with ASCVD and elevated LDL.  Would benefit from statin therapy.  Had a long conversation with patient regarding his fears of statins and their benefit.  He is still adamant that he does not want to try any other statin. Given he has only tried and had side effects with Crestor, he will not qualify for PCSK-9 inhibitor at this time.  He is aware of this.  Discussed other options and lack of data for secondary prevention.  He wants to try niacin.  Explained this would likely not improve his cholesterol much.  He has taken this in the past so he feels comfortable with risk but had flushing.  Explained to him this is likely to  happen again but he still wants to try.  Instructed to take his ASA 30 minutes prior to help reduce the risk of flushing.  He was instructed to call in 2 weeks with an update.  If he is able to tolerate, will need to recheck lipids/liver in 6 weeks and uptitrate dose to goal of 2000mg /day.

## 2015-08-11 NOTE — Patient Instructions (Signed)
You can try Niacin 500mg  extended release tablet.  If you have flushing, take your aspirin 30 minutes prior to the niacin.    Please call Gay Filler at [phone] in 2 weeks to let me know how you are doing.

## 2015-08-12 ENCOUNTER — Telehealth: Payer: Self-pay | Admitting: *Deleted

## 2015-08-12 ENCOUNTER — Encounter: Payer: Self-pay | Admitting: Physician Assistant

## 2015-08-12 NOTE — Telephone Encounter (Signed)
DPR on file for wife who has been notified of echo results for pt. She also states pt was told by cardiac rehab that they have not yet heard from our office to set pt up for cardiac rehab. I stated I will look into it, because I know we have sent form

## 2015-08-12 NOTE — Telephone Encounter (Signed)
I s/w Thayer Headings at [facility] per pt's wife who states pt still not set up. Thayer Headings informed me that she had s/w Verdis Frederickson at [facility] which she had s/w pt. Per Verdis Frederickson through [facility] s/w pt on 07/23/15 which at that time pt states wanted to check with insurance co, then will let cardiac rehab know if he decides to do rehab. I then lmom on pt's home # stating that cardiac rehab is ready and that they are waiting for pt to decide as discussed with Verdis Frederickson 07/23/15 per Thayer Headings. I lmom for pt if he is going to do rehab to please call [phone] to get set up.

## 2015-09-04 ENCOUNTER — Encounter: Payer: Self-pay | Admitting: Internal Medicine

## 2015-09-04 ENCOUNTER — Ambulatory Visit (INDEPENDENT_AMBULATORY_CARE_PROVIDER_SITE_OTHER): Payer: PPO | Admitting: Internal Medicine

## 2015-09-04 VITALS — BP 120/70 | HR 66 | Ht 73.0 in | Wt 188.0 lb

## 2015-09-04 DIAGNOSIS — R05 Cough: Secondary | ICD-10-CM | POA: Diagnosis not present

## 2015-09-04 DIAGNOSIS — R059 Cough, unspecified: Secondary | ICD-10-CM

## 2015-09-04 NOTE — Progress Notes (Signed)
Subjective:     Patient ID: Trevor Branch, male   DOB: 07-15-1945    MRN: [id number]    History of Present Illness  21 yowm never smoker with h/o itchy /sneezing/runny nose in 20s > allergy shots x sev months > worse > seemed better p stopped and no need meds/doctors and did fine until around 2015/16 sensation of something stuck in throat with intermittent severe cough so referred to pulmonary clinic 08/06/2015 by Dr Doyle Askew    History of Present Illness  08/06/2015 1st [facility] office visit/ Rickey Farrier  On timoptic eyedrops no systemic BB  Chief Complaint  Patient presents with  . Pulmonary Consult    Referred by Dr. Christella Noa. Pt c/o cough for the past 2 yrs- worse since July 2016. Cough is prod with thick, clear sputum. Cough is esp worse at night and first thing in the am. He also c/o occ SOB- when he does too much exertion or gets stressed.   cough x 2 years, worse after supper and has to sleep in recliner/ immediate cough when lies flat  Worse p stents 06/2015  worse on coreg but never took ACEi  tessilon no  Better  / prednisone no better, worse with laughing / singing  rec For drainage / throat tickle try take CHLORPHENIRAMINE  4 mg - take one every 4 hours as needed -   Cough not pee so stopped Omeprazole 40 Take 30- 60 min before your first and last meals of the day  GERD diet     09/04/2015  f/u ov/Ashleyann Shoun re: cough x 2 years worse on timolol ? Still on coreg(unclear)  crackers/ known gerd by [facility] Chief Complaint  Patient presents with  . Follow-up    Cough has improved some but has not resolved completely. Breathing is unchanged.    still sleeping in recliner but able to get thru the night now on ppi bid  Doe = MMRC2 = can't walk a nl pace on a flat grade s sob but does fine slow and flat eg shopping at [employer] leaning on cart   No obvious day to day or daytime variability or assoc truly excess/ purulent sputum or mucus plugs   or cp or chest tightness, subjective wheeze or overt  sinus or hb symptoms on ppi qd with Pos UGI for provoked reflux in 2005  . No unusual exp hx or h/o childhood pna/ asthma or knowledge of premature birth.  Sleeping ok without nocturnal  or early am exacerbation  of respiratory  c/o's or need for noct saba. Also denies any obvious fluctuation of symptoms with weather or environmental changes or other aggravating or alleviating factors except as outlined above   Current Medications, Allergies, Complete Past Medical History, Past Surgical History, Family History, and Social History were reviewed in [employer] record.  ROS  The following are not active complaints unless bolded sore throat, dysphagia, dental problems, itching, sneezing,  nasal congestion or excess/ purulent secretions, ear ache,   fever, chills, sweats, unintended wt loss, classically pleuritic or exertional cp, hemoptysis,  orthopnea pnd or leg swelling, presyncope, palpitations, abdominal pain, anorexia, nausea, vomiting, diarrhea  or change in bowel or bladder habits, change in stools or urine, dysuria,hematuria,  rash, arthralgias, visual complaints, headache, numbness, weakness or ataxia or problems with walking or coordination,  change in mood/affect or memory.           Objective:   Physical Exam     09/04/2015  188   08/06/15 192 lb 9.6 oz (87.363 kg)  07/24/15 193 lb 12.8 oz (87.907 kg)  07/09/15 193 lb 12.8 oz (87.907 kg)    Vital signs reviewed        HEENT: nl dentition, turbinates, and oropharynx. Nl external ear canals without cough reflex   NECK :  without JVD/Nodes/TM/ nl carotid upstrokes bilaterally   LUNGS: no acc muscle use,  Nl contour chest which is clear to A and P bilaterally without cough on insp or exp maneuvers   CV:  RRR  no s3 or murmur or increase in P2, no edema   ABD:  soft and nontender with nl inspiratory excursion in the supine position. No bruits or organomegaly, bowel sounds nl  MS:  Nl gait/ ext warm  without deformities, calf tenderness, cyanosis or clubbing No obvious joint restrictions   SKIN: warm and dry without lesions    NEURO:  alert, approp, nl sensorium with  no motor deficits    I personally reviewed images and agree with radiology impression as follows:  CT Chest  With contrast 07/10/15 1. Pericarditis with small pericardial effusion. Given recent myocardial infarction question Dressler's syndrome. 2. Trace right and small left pleural effusions.      Assessment:

## 2015-09-04 NOTE — Patient Instructions (Addendum)
Please see patient coordinator before you leave today  to schedule sinus CT   Stay on omeprazole Take 30- 60 min before your first and last meals of the day   GERD (REFLUX)  is an extremely common cause of respiratory symptoms just like yours , many times with no obvious heartburn at all.    It can be treated with medication, but also with lifestyle changes including elevation of the head of your bed (ideally with 6 inch  bed blocks),  Smoking cessation, avoidance of late meals, excessive alcohol, and avoid fatty foods, chocolate, peppermint, colas, red wine, and acidic juices such as orange juice.  NO MINT OR MENTHOL PRODUCTS SO NO COUGH DROPS  USE SUGARLESS CANDY INSTEAD (Jolley ranchers or Stover's or Life Savers) or even ice chips will also do - the key is to swallow to prevent all throat clearing. NO OIL BASED VITAMINS - use powdered substitutes.    Talk to your eye doctor for a substitute for timolol   Please schedule a follow up office visit in 6 weeks, call sooner if needed

## 2015-09-06 NOTE — Assessment & Plan Note (Signed)
UGI  09/17/2003   Gastroesophageal reflux was seen to the level of the proximal thoracic esophagus during provocative maneuvers. In addition, mild esophageal dysmotility is noted with breakup of the primary peristaltic wave in the proximal thoracic esophagus. Max gerd rx 08/06/2015  - Allergy profile 08/06/2015 >  Eos 0.2 /  IgE  48 POS RAST for trees   Lack of cough resolution on a verified empirical regimen could mean an alternative diagnosis(? BB assoc cough), persistence of the disease state (eg sinusitis or bronchiectasis) , or inadequacy of currently available therapy (eg no medical rx available for non-acid gerd)   Next step is therefore w/u for sinus dz as we know by recent CT he has no bronchiectasis, continue max acid suppression and diet/ lifestyle rx for Jerrye Bushy as we know he has it but not that it's contributing at this point, and try a substitute for timolol  I had an extended discussion with the patient reviewing all relevant studies completed to date and  lasting 15 to 20 minutes of a 25 minute visit    Each maintenance medication was reviewed in detail including most importantly the difference between maintenance and prns and under what circumstances the prns are to be triggered using an action plan format that is not reflected in the computer generated alphabetically organized AVS.    Please see instructions for details which were reviewed in writing and the patient given a copy highlighting the part that I personally wrote and discussed at today's ov.

## 2015-09-09 ENCOUNTER — Ambulatory Visit (INDEPENDENT_AMBULATORY_CARE_PROVIDER_SITE_OTHER)
Admission: RE | Admit: 2015-09-09 | Discharge: 2015-09-09 | Disposition: A | Discharge status: Home / Self Care | Payer: PPO | Source: Ambulatory Visit | Attending: Internal Medicine | Admitting: Internal Medicine

## 2015-09-09 DIAGNOSIS — J342 Deviated nasal septum: Secondary | ICD-10-CM | POA: Diagnosis not present

## 2015-09-09 DIAGNOSIS — R05 Cough: Secondary | ICD-10-CM

## 2015-09-09 DIAGNOSIS — R059 Cough, unspecified: Secondary | ICD-10-CM

## 2015-09-11 ENCOUNTER — Telehealth: Payer: Self-pay | Admitting: Internal Medicine

## 2015-09-11 ENCOUNTER — Encounter: Payer: Self-pay | Admitting: Family Medicine

## 2015-09-11 NOTE — Progress Notes (Signed)
Quick Note:  LMTCB ______ 

## 2015-09-11 NOTE — Telephone Encounter (Signed)
Patient notified of CT results.  Nothing further needed. 

## 2015-09-14 ENCOUNTER — Other Ambulatory Visit: Payer: Self-pay | Admitting: *Deleted

## 2015-09-14 ENCOUNTER — Encounter: Payer: Self-pay | Admitting: *Deleted

## 2015-09-14 DIAGNOSIS — Z006 Encounter for examination for normal comparison and control in clinical research program: Secondary | ICD-10-CM

## 2015-09-14 MED ORDER — AMBULATORY NON FORMULARY MEDICATION: 81.0000 mg | Freq: Every day | Status: DC

## 2015-09-14 NOTE — Progress Notes (Signed)
TWILIGHT Research study Randomization 3 month visit completed. Patient states he has a few scrotal ares that have some enlarged veins. He stated he experienced some bleeding in that area and it stopped. He did not seek any medical attention. I encouraged him to schedule an appointment with his PCP to have this looked into and explained it very well could be Varicoceles. Encouraged him to wear some type of support (jock strap or briefs that give support). Patient verbalized understanding. Dispensed bottle 3's [id number]; [phone]. Patient Is randomized to ASA 81 mg daily or PLACEBO.

## 2015-10-05 ENCOUNTER — Ambulatory Visit: Payer: PPO | Admitting: Cardiovascular Disease

## 2015-10-05 DIAGNOSIS — H401121 Primary open-angle glaucoma, left eye, mild stage: Secondary | ICD-10-CM | POA: Diagnosis not present

## 2015-10-05 DIAGNOSIS — H401112 Primary open-angle glaucoma, right eye, moderate stage: Secondary | ICD-10-CM | POA: Diagnosis not present

## 2015-10-06 NOTE — Progress Notes (Signed)
Cardiology Office Note   Date:  10/07/2015   ID:  Trevor Branch, DOB September 24, 1945, MRN [id number]  PCP:  Orpah Melter, MD  Cardiologist:   Minus Breeding, MD   Chief Complaint  Patient presents with  . Coronary Artery Disease     History of Present Illness: Trevor Branch is a 70 y.o. male who presents for follow up of CAD.  He is new to me but was seen by Dr. Acie Fredrickson and Richardson Dopp recently.  He had  NSTEM earlier this year. LHC demonstrated OM1 80%, mid LAD 99%. He underwent PCI with DES x 2 to the mLAD. Echo confirmed ischemic CM with EF 35-40% with ant-septal, inf-septal and apical wall motion abnormalities. He returned to the Cath Lab 3/15 and underwent staged PCI of the OM1 with a Synergy DES. LVEDP 17-19 mmHg. No beta-blocker was started due to bradycardia. He was not put on ACE inhibitor due to low BP. He is allergic to statins and was put on Zetia. 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  He had a follow up echo in May that demonstrated a low normal EF without mention of effusion.    He follows with me for the first time. Since he has been seen he has had some continued episodes of episodic shortness of breath. He thinks this is related to anxiety. He might get a little chest discomfort but he's had none of the symptoms that he had with his previous heart attack. He's active doing walking and yard work and cannot bring on any symptoms. He's not having any resting shortness of breath, PND or orthopnea. He's not describing palpitations, presyncope or syncope. He is however having increased irritable episodes and anger. He says this is an exacerbation of his PTSD   Past Medical History  Diagnosis Date  . GERD (gastroesophageal reflux disease)   . SVT (supraventricular tachycardia) (HCC)     s/p prior RFCA  . PTSD (post-traumatic stress disorder)     panic attacks  . CAD (coronary artery disease)     a.  NSTEMI 3/17 - s/p DES x 2 to mid LAD; staged PCI with DES to OM1; residual CAD - mLCX 40%, pRCA 30%, mRCA 20%   . History of non-ST elevation myocardial infarction (NSTEMI) 06/2015  . Ischemic cardiomyopathy     a. Echo 06/23/15 - EF 35-40%, ant-septal, inferoseptal, apical AK, Gr 1 DD, trivial AI >>  b. Echo 5/17 - EF 55-60%, no RWMA, Gr 1 DD    Past Surgical History  Procedure Laterality Date  . Supraventricular tachycardia ablation  2009    at [facility]  . Cardiac catheterization [name] 06/22/2015    Procedure: Left Heart Cath and Coronary Angiography;  Surgeon: Jettie Booze, MD;  Location: [facility];  Service: Cardiovascular;  Laterality: [name];  . Cardiac catheterization [name] 06/22/2015    Procedure: Coronary Stent Intervention;  Surgeon: Jettie Booze, MD;  Location: [facility];  Service: Cardiovascular;  Laterality: [name];  . Cardiac catheterization [name] 06/24/2015    Procedure: Left Heart Cath and Coronary Angiography;  Surgeon: Troy Sine, MD;  Location: [facility];  Service: Cardiovascular;  Laterality: [name];  . Cardiac catheterization [name] 06/24/2015    Procedure: Coronary Stent Intervention;  Surgeon: Troy Sine, MD;  Location: [facility];  Service: Cardiovascular;  Laterality: [name];     Current Outpatient Prescriptions  Medication Sig Dispense Refill  . ALPRAZolam (XANAX) 0.5 MG tablet Take 0.5 mg by mouth as needed for anxiety.    Marland Kitchen latanoprost (XALATAN) 0.005 % ophthalmic solution Place 1 drop into both eyes daily.    . clopidogrel (PLAVIX) 75 MG tablet Take 1 tablet (75 mg total) by mouth daily. 30 tablet 6  . famotidine (PEPCID) 40 MG tablet Take 1 tablet (40 mg total) by mouth 2 (two) times daily.    . pravastatin (PRAVACHOL) 40 MG tablet Take 1 tablet (40 mg total) by mouth every evening. 30 tablet 6   No current facility-administered medications for this visit.    Allergies:   Other; Atorvastatin; Doxycycline; Mucinex; Rosuvastatin;  Verapamil hcl er; and Imitrex    ROS:  Please see the history of present illness.   Otherwise, review of systems are positive for none.   All other systems are reviewed and negative.    PHYSICAL EXAM: VS:  BP 120/70 mmHg  Pulse 72  Ht 6\' 1"  (1.854 m)  Wt 191 lb (86.637 kg)  BMI 25.20 kg/m2 , BMI Body mass index is 25.2 kg/(m^2). GENERAL:  Well appearing HEENT:  Pupils equal round and reactive, fundi not visualized, oral mucosa unremarkable NECK:  No jugular venous distention, waveform within normal limits, carotid upstroke brisk and symmetric, no bruits, no thyromegaly LYMPHATICS:  No cervical, inguinal adenopathy LUNGS:  Clear to auscultation bilaterally BACK:  No CVA tenderness CHEST:  Unremarkable HEART:  PMI not displaced or sustained,S1 and S2 within normal limits, no S3, no S4, no clicks, no rubs, no murmurs ABD:  Flat, positive bowel sounds normal in frequency in pitch, no bruits, no rebound, no guarding, no midline pulsatile mass, no hepatomegaly, no splenomegaly EXT:  2 plus pulses throughout, no edema, no cyanosis no clubbing    EKG:  EKG is not ordered today.   Recent Labs: 06/20/2015: TSH 2.260 06/25/2015: BUN 18; Creatinine, Ser 1.32*; Potassium 3.8; Sodium 141 08/06/2015: Hemoglobin 13.9; Platelets 231.0    Lipid Panel    Component Value Date/Time   CHOL 192 06/20/2015 0658   TRIG 71 06/20/2015 0658   HDL 44 06/20/2015 0658   CHOLHDL 4.4 06/20/2015 0658   VLDL 14 06/20/2015 0658   LDLCALC 134* 06/20/2015 0658   LDLDIRECT 173.8 10/03/2007 0721      Wt Readings from Last 3 Encounters:  10/07/15 191 lb (86.637 kg)  09/04/15 188 lb (85.276 kg)  08/06/15 192 lb 9.6 oz (87.363 kg)      Other studies Reviewed: Additional studies/ records that were reviewed today include: Hospital records.. Review of the above records demonstrates:  Please see elsewhere in the note.     ASSESSMENT AND PLAN:   CAD -    I do think that his shortness of breath is related  to his Brilinta.  He is in the TWILIGHT study.  I will stop his Brilinta and start him on Plavix and we will discuss this with the research nurses.  Ischemic CM -    His ejection fraction is improved. He will continue the meds as listed.  HL -   After much conversation he agrees to try Pravachol 40 mg daily.  PTSD -   We discussed the importance of getting this treated and he had canceled a psychiatry appointment to come here. This is being rescheduled.  Current medicines are reviewed at length with the patient today.  The patient does not have concerns regarding medicines.  The following changes have been made:  As above  Labs/ tests ordered today include:  No orders of the defined types were placed in this encounter.     Disposition:   FU with me in 3 months.     Signed, Minus Breeding, MD  10/07/2015 12:55 PM    [facility]

## 2015-10-07 ENCOUNTER — Encounter: Payer: Self-pay | Admitting: Cardiology

## 2015-10-07 ENCOUNTER — Ambulatory Visit (INDEPENDENT_AMBULATORY_CARE_PROVIDER_SITE_OTHER): Payer: PPO | Admitting: Cardiology

## 2015-10-07 VITALS — BP 120/70 | HR 72 | Ht 73.0 in | Wt 191.0 lb

## 2015-10-07 DIAGNOSIS — I214 Non-ST elevation (NSTEMI) myocardial infarction: Secondary | ICD-10-CM | POA: Diagnosis not present

## 2015-10-07 DIAGNOSIS — I255 Ischemic cardiomyopathy: Secondary | ICD-10-CM

## 2015-10-07 DIAGNOSIS — I2510 Atherosclerotic heart disease of native coronary artery without angina pectoris: Secondary | ICD-10-CM | POA: Diagnosis not present

## 2015-10-07 MED ORDER — FAMOTIDINE 40 MG PO TABS: 40.0000 mg | ORAL_TABLET | Freq: Two times a day (BID) | ORAL | Status: DC

## 2015-10-07 MED ORDER — CLOPIDOGREL BISULFATE 75 MG PO TABS: 75.0000 mg | ORAL_TABLET | Freq: Every day | ORAL | Status: DC

## 2015-10-07 MED ORDER — PRAVASTATIN SODIUM 40 MG PO TABS: 40.0000 mg | ORAL_TABLET | Freq: Every evening | ORAL | Status: DC

## 2015-10-07 NOTE — Patient Instructions (Addendum)
Medication Instructions:  Please stop Brilinta. Please stop Prilosec.  You may take Pepcid 40 mg over the counter instead. Start Plavix 75 mg a day. Start Pravastatin 40 mg a day. Continue all other medications as listed.  Follow-Up: Follow up in 3 months with Dr Percival Spanish in [place].  If you need a refill on your cardiac medications before your next appointment, please call your pharmacy.  Thank you for choosing [facility]!!

## 2015-10-16 ENCOUNTER — Other Ambulatory Visit: Payer: Self-pay | Admitting: *Deleted

## 2015-10-16 ENCOUNTER — Encounter: Payer: Self-pay | Admitting: *Deleted

## 2015-10-16 DIAGNOSIS — Z006 Encounter for examination for normal comparison and control in clinical research program: Secondary | ICD-10-CM

## 2015-10-16 MED ORDER — ASPIRIN EC 81 MG PO TBEC: 81.0000 mg | DELAYED_RELEASE_TABLET | Freq: Every day | ORAL | Status: AC

## 2015-10-16 NOTE — Progress Notes (Unsigned)
Spoke with Dr. Angelena Form  (PI for TWILIGHT Research study) about ASA after stopping Brilinta due to [facility]. ASA 81 mg daily was ordered. Patient was contacted and instructed to restart AS

## 2015-10-16 NOTE — Progress Notes (Signed)
Spoke with Dr. Angelena Form (PI TWILIGHT Research study) about continuing  ASA 81 mg with plavix after brilinta and study drug was stopped due to [facility]. ASA 81 mg daily was ordered. Contacted patient with instructions. Also instructed that Dr. Percival Spanish was not in office and would contact him if this was not what he desired.

## 2015-10-16 NOTE — Progress Notes (Signed)
TWILIGHT 4 month telephone call to patient completed. Patient stopped Brilinta due to Shortness of breath, his last dose of both Brilinta ans ASA/placebo was 10/06/15. Patient states his [facility] has subsided and he is no longer bruising as much. Patient states he has not started back on open label ASA and wanted to know if he was ordered that. I informed patient I would check with Dr. Percival Spanish or his nurse and would call him back once I receive information. I also told patient that since he was randomized in the TWILIGHT Study I would continue to following him via phone. Questions encouraged and answered.

## 2015-10-20 ENCOUNTER — Ambulatory Visit: Payer: PPO | Admitting: Internal Medicine

## 2015-10-26 ENCOUNTER — Encounter: Payer: Self-pay | Admitting: *Deleted

## 2015-10-26 ENCOUNTER — Telehealth: Payer: Self-pay | Admitting: Cardiology

## 2015-10-26 DIAGNOSIS — Z006 Encounter for examination for normal comparison and control in clinical research program: Secondary | ICD-10-CM

## 2015-10-26 NOTE — Telephone Encounter (Signed)
Please call,question about his medicine. °

## 2015-10-26 NOTE — Telephone Encounter (Signed)
Confirmed with patient he is to take both ASA and Plavix. He stated the TWILIGHT nurse called him and clarified everything (see other 7/17 phone note). He has no other questions at this time and was grateful for assistance.

## 2015-10-26 NOTE — Telephone Encounter (Signed)
He is to take ASA and Plavix.  Ask the TWILIGHT nurse about follow up in the study.

## 2015-10-26 NOTE — Progress Notes (Signed)
Telephone call to patient after receiving message he had some confusion about ASA and TWILIGHT Research study. I spoke with patient again today with detailed instructions that he IS to RESTART ASA as instructed on 10/16/15. Patient verbalized understanding. I also instructed patient that he is only in telephone follow up as part of the research study and offered him to decline if he wished. He wanted to know why he was still in study if he was not on Brilinta. I explained the rational and he seemed to be okay. Questions encouraged and answered, instructed patient to call for anymore questions and apologized for any confusion.

## 2015-10-26 NOTE — Telephone Encounter (Signed)
Patient called with questions regarding TWILIGHT study. As noted in prior encounters, patient stopped Brilinta secondary to SOB.  Patient has a few concerns: 1) patient was told he was to start ASA 81 mg daily 7/7 by [facility] RN. He is still waiting to hear from Dr. Percival Spanish to confirm if he is supposed to take it. He is NOT currently taking ASA. He is taking Plavix 75 mg daily. 2) he would like clarification on whether or not he is still even in the TWILIGHT study since he is no long taking Brilinta.   To Dr. Percival Spanish and [facility] RN.

## 2015-11-02 DIAGNOSIS — H401112 Primary open-angle glaucoma, right eye, moderate stage: Secondary | ICD-10-CM | POA: Diagnosis not present

## 2015-11-02 DIAGNOSIS — H401121 Primary open-angle glaucoma, left eye, mild stage: Secondary | ICD-10-CM | POA: Diagnosis not present

## 2015-11-11 DIAGNOSIS — L570 Actinic keratosis: Secondary | ICD-10-CM | POA: Diagnosis not present

## 2015-11-11 DIAGNOSIS — D225 Melanocytic nevi of trunk: Secondary | ICD-10-CM | POA: Diagnosis not present

## 2015-11-11 DIAGNOSIS — L820 Inflamed seborrheic keratosis: Secondary | ICD-10-CM | POA: Diagnosis not present

## 2015-11-11 DIAGNOSIS — L821 Other seborrheic keratosis: Secondary | ICD-10-CM | POA: Diagnosis not present

## 2015-12-29 NOTE — Progress Notes (Signed)
Cardiology Office Note   Date:  12/30/2015   ID:  Trevor Branch, DOB [birth year], MRN [id number]  PCP:  Orpah Melter, MD  Cardiologist:   Minus Breeding, MD   Chief Complaint  Patient presents with  . Coronary Artery Disease     History of Present Illness: Trevor Branch is a 70 y.o. male who presents for follow up of CAD.  He is new to me but was seen by Dr. Acie Fredrickson and Richardson Dopp recently.  He had  NSTEM earlier this year. LHC demonstrated OM1 80%, mid LAD 99%. He underwent PCI with DES x 2 to the mLAD. Echo confirmed ischemic CM with EF 35-40% with ant-septal, inf-septal and apical wall motion abnormalities. He returned to the Cath Lab 3/15 and underwent staged PCI of the OM1 with a Synergy DES. LVEDP 17-19 mmHg. No beta-blocker was started due to bradycardia. He was not put on ACE inhibitor due to low BP. He is allergic to statins and was put on Zetia. 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  He had a follow up echo in May that demonstrated a low normal EF without mention of effusion.  At the first visit with me he was having some shortness of breath so I switched him to Plavix from [place].  He returns for follow up.  He was started on Pravachol.  He says he is now breathing much better he's no longer short of breath. He is kayaking for exercise and does some walking and he has no problems with this.   Past Medical History:  Diagnosis Date  . CAD (coronary artery disease)    a. NSTEMI 3/17 - s/p DES x 2 to mid LAD; staged PCI with DES to OM1; residual CAD - mLCX 40%, pRCA 30%, mRCA 20%   . GERD (gastroesophageal reflux disease)   . History of non-ST elevation myocardial infarction (NSTEMI) 06/2015  . Ischemic cardiomyopathy    a. Echo 06/23/15 - EF 35-40%, ant-septal, inferoseptal, apical AK, Gr 1 DD, trivial AI >>  b. Echo 5/17 - EF 55-60%, no RWMA, Gr 1 DD  . PTSD (post-traumatic stress disorder)    panic  attacks  . SVT (supraventricular tachycardia) (HCC)    s/p prior RFCA    Past Surgical History:  Procedure Laterality Date  . CARDIAC CATHETERIZATION [name] 06/22/2015   Procedure: Left Heart Cath and Coronary Angiography;  Surgeon: Jettie Booze, MD;  Location: [place] [facility];  Service: Cardiovascular;  Laterality: [name];  . CARDIAC CATHETERIZATION [name] 06/22/2015   Procedure: Coronary Stent Intervention;  Surgeon: Jettie Booze, MD;  Location: [place] [facility];  Service: Cardiovascular;  Laterality: [name];  . CARDIAC CATHETERIZATION [name] 06/24/2015   Procedure: Left Heart Cath and Coronary Angiography;  Surgeon: Troy Sine, MD;  Location: [place] [facility];  Service: Cardiovascular;  Laterality: [name];  . CARDIAC CATHETERIZATION [name] 06/24/2015   Procedure: Coronary Stent Intervention;  Surgeon: Troy Sine, MD;  Location: [place] [facility];  Service: Cardiovascular;  Laterality: [name];  . SUPRAVENTRICULAR TACHYCARDIA ABLATION  2009   at [facility]     Current Outpatient Prescriptions  Medication Sig Dispense Refill  . ALPRAZolam (XANAX) 0.5 MG tablet Take 0.5 mg by mouth as needed for anxiety.    Marland Kitchen aspirin EC 81 MG tablet Take 1 tablet (81 mg total) by mouth daily. 90 tablet 3  . clopidogrel (PLAVIX) 75 MG tablet Take  1 tablet (75 mg total) by mouth daily. 30 tablet 6  . famotidine (PEPCID) 40 MG tablet Take 1 tablet (40 mg total) by mouth 2 (two) times daily.    Marland Kitchen latanoprost (XALATAN) 0.005 % ophthalmic solution Place 1 drop into both eyes daily.    . pravastatin (PRAVACHOL) 40 MG tablet Take 1 tablet (40 mg total) by mouth every evening. 30 tablet 6   No current facility-administered medications for this visit.     Allergies:   Other; Atorvastatin; Doxycycline; Mucinex [guaifenesin er]; Rosuvastatin; Verapamil hcl er; and Imitrex [sumatriptan]    ROS:  Please see the history of present illness.   Otherwise, review of systems are positive for anger issues.   All other  systems are reviewed and negative.    PHYSICAL EXAM: VS:  BP 122/78   Pulse (!) 56   Ht 6\' 1"  (1.854 m)   Wt 186 lb (84.4 kg)   BMI 24.54 kg/m  , BMI Body mass index is 24.54 kg/m. GENERAL:  Well appearing HEENT:  Pupils equal round and reactive, fundi not visualized, oral mucosa unremarkable NECK:  No jugular venous distention, waveform within normal limits, carotid upstroke brisk and symmetric, no bruits, no thyromegaly LYMPHATICS:  No cervical, inguinal adenopathy LUNGS:  Clear to auscultation bilaterally BACK:  No CVA tenderness CHEST:  Unremarkable HEART:  PMI not displaced or sustained,S1 and S2 within normal limits, no S3, no S4, no clicks, no rubs, no murmurs ABD:  Flat, positive bowel sounds normal in frequency in pitch, no bruits, no rebound, no guarding, no midline pulsatile mass, no hepatomegaly, no splenomegaly EXT:  2 plus pulses throughout, no edema, no cyanosis no clubbing    EKG:  EKG is not  ordered today. Sinus rhythm, rate 55, axis within normal limits, intervals within normal limits, axis within normal limits, no acute ST-T wave changes.  Recent Labs: 06/20/2015: TSH 2.260 06/25/2015: BUN 18; Creatinine, Ser 1.32; Potassium 3.8; Sodium 141 08/06/2015: Hemoglobin 13.9; Platelets 231.0    Lipid Panel    Component Value Date/Time   CHOL 192 06/20/2015 0658   TRIG 71 06/20/2015 0658   HDL 44 06/20/2015 0658   CHOLHDL 4.4 06/20/2015 0658   VLDL 14 06/20/2015 0658   LDLCALC 134 (H) 06/20/2015 0658   LDLDIRECT 173.8 10/03/2007 0721      Wt Readings from Last 3 Encounters:  12/30/15 186 lb (84.4 kg)  10/07/15 191 lb (86.6 kg)  09/04/15 188 lb (85.3 kg)      Other studies Reviewed: Additional studies/ records that were reviewed today include: none. Review of the above records demonstrates:     ASSESSMENT AND PLAN:   CAD -    The patient has no new sypmtoms.  No further cardiovascular testing is indicated.  We will continue with aggressive risk  reduction and meds as listed. I'll see him back at the one-year anniversary of his stent. His Plavix  Ischemic CM -    His ejection fraction is improved. He will continue the meds as listed.  HL -    He is tolerating pravastatin and he will get a lipid profile.  PTSD -    we discussed this again today and I will encourage psychiatry follow-up and therapist.   Current medicines are reviewed at length with the patient today.  The patient does not have concerns regarding medicines.  The following changes have been made:  None  Labs/ tests ordered today include:   None  Orders Placed This Encounter  Procedures  . Lipid panel  . EKG 12-Lead     Disposition:   FU with me in March   Signed, Minus Breeding, MD  12/30/2015 11:55 AM    [place]

## 2015-12-30 ENCOUNTER — Encounter: Payer: Self-pay | Admitting: Cardiology

## 2015-12-30 ENCOUNTER — Ambulatory Visit (INDEPENDENT_AMBULATORY_CARE_PROVIDER_SITE_OTHER): Payer: PPO | Admitting: Cardiology

## 2015-12-30 VITALS — BP 122/78 | HR 56 | Ht 73.0 in | Wt 186.0 lb

## 2015-12-30 DIAGNOSIS — I2510 Atherosclerotic heart disease of native coronary artery without angina pectoris: Secondary | ICD-10-CM | POA: Diagnosis not present

## 2015-12-30 DIAGNOSIS — I2583 Coronary atherosclerosis due to lipid rich plaque: Secondary | ICD-10-CM

## 2015-12-30 DIAGNOSIS — E785 Hyperlipidemia, unspecified: Secondary | ICD-10-CM

## 2015-12-30 NOTE — Patient Instructions (Signed)
Medication Instructions:  The current medical regimen is effective;  continue present plan and medications.  Labwork: Please have fasting blood work (Lipid) at [facility].  Follow-Up: Follow up in 6 months with Dr. Percival Spanish.  You will receive a letter in the mail 2 months before you are due.  Please call us when you receive this letter to schedule your follow up appointment.  If you need a refill on your cardiac medications before your next appointment, please call your pharmacy.  Thank you for choosing [facility]!!

## 2015-12-31 ENCOUNTER — Other Ambulatory Visit: Payer: PPO

## 2015-12-31 DIAGNOSIS — E785 Hyperlipidemia, unspecified: Secondary | ICD-10-CM | POA: Diagnosis not present

## 2015-12-31 DIAGNOSIS — I2583 Coronary atherosclerosis due to lipid rich plaque: Secondary | ICD-10-CM

## 2015-12-31 DIAGNOSIS — I2510 Atherosclerotic heart disease of native coronary artery without angina pectoris: Secondary | ICD-10-CM

## 2016-01-01 LAB — LIPID PANEL
CHOLESTEROL TOTAL: 186 mg/dL (ref 100–199)
Chol/HDL Ratio: 3.5 ratio units (ref 0.0–5.0)
HDL: 53 mg/dL (ref >39)
LDL Calculated: 106 mg/dL — ABNORMAL HIGH (ref 0–99)
TRIGLYCERIDES: 133 mg/dL (ref 0–149)
VLDL Cholesterol Cal: 27 mg/dL (ref 5–40)

## 2016-01-06 ENCOUNTER — Encounter: Payer: Self-pay | Admitting: *Deleted

## 2016-02-04 DIAGNOSIS — N401 Enlarged prostate with lower urinary tract symptoms: Secondary | ICD-10-CM | POA: Diagnosis not present

## 2016-02-04 DIAGNOSIS — Z125 Encounter for screening for malignant neoplasm of prostate: Secondary | ICD-10-CM | POA: Diagnosis not present

## 2016-02-04 DIAGNOSIS — R350 Frequency of micturition: Secondary | ICD-10-CM | POA: Diagnosis not present

## 2016-02-04 DIAGNOSIS — N486 Induration penis plastica: Secondary | ICD-10-CM | POA: Diagnosis not present

## 2016-03-09 ENCOUNTER — Encounter: Payer: Self-pay | Admitting: *Deleted

## 2016-03-09 DIAGNOSIS — Z006 Encounter for examination for normal comparison and control in clinical research program: Secondary | ICD-10-CM

## 2016-03-09 NOTE — Progress Notes (Signed)
TWILIGHT Research study month 9 telephone visit completed. Patient continue to take ASA and Plavix and has not had any bleeding events and less bruising. Patient still agrees to remain in follow up even though he is not on Brilinta. Only 2 more telephone calls remain. Questions encouraged and answered.

## 2016-04-25 DIAGNOSIS — L821 Other seborrheic keratosis: Secondary | ICD-10-CM | POA: Diagnosis not present

## 2016-04-25 DIAGNOSIS — D485 Neoplasm of uncertain behavior of skin: Secondary | ICD-10-CM | POA: Diagnosis not present

## 2016-04-25 DIAGNOSIS — D692 Other nonthrombocytopenic purpura: Secondary | ICD-10-CM | POA: Diagnosis not present

## 2016-04-25 DIAGNOSIS — C44622 Squamous cell carcinoma of skin of right upper limb, including shoulder: Secondary | ICD-10-CM | POA: Diagnosis not present

## 2016-04-25 DIAGNOSIS — L570 Actinic keratosis: Secondary | ICD-10-CM | POA: Diagnosis not present

## 2016-04-25 DIAGNOSIS — L438 Other lichen planus: Secondary | ICD-10-CM | POA: Diagnosis not present

## 2016-04-25 DIAGNOSIS — D0439 Carcinoma in situ of skin of other parts of face: Secondary | ICD-10-CM | POA: Diagnosis not present

## 2016-04-29 ENCOUNTER — Other Ambulatory Visit: Payer: Self-pay | Admitting: Cardiology

## 2016-04-29 NOTE — Telephone Encounter (Signed)
°  New Prob   *STAT* If patient is at the pharmacy, call can be transferred to refill team.   1. Which medications need to be refilled? (please list name of each medication and dose if known) clopidogrel (PLAVIX) 75 MG tablet pravastatin (PRAVACHOL) 40 MG tablet  2. Which pharmacy/location (including street and city if local pharmacy) is medication to be sent to? [facility]- [place]  3. Do they need a 30 day or 90 day supply? 90 days    Pt has a follow up appointment on 06/29/16 at 9:45 AM with Dr. Percival Spanish.

## 2016-05-26 ENCOUNTER — Telehealth: Payer: Self-pay | Admitting: Cardiology

## 2016-05-26 NOTE — Telephone Encounter (Signed)
Spoke with pt, he had a physical at the [place] recently and they reported his total cholesterol was 216, trig=228, HDL=63 and LDL=107. His PSA was also elevated to 11.5. They are wanting the patient to have a prostrate biopsy and he would need instructions regarding his plavix. He has a follow up appointment in [place] with dr hochrein in march. He wants to know what dr hochrein thinks. He has a copy of the lab work he can bring to an appointment. Will forward for dr hochrein's review and advise.

## 2016-05-26 NOTE — Telephone Encounter (Signed)
New message    Pt is calling for nurse to call him

## 2016-05-29 NOTE — Telephone Encounter (Signed)
He is close to one year out from his stent.   It would be fine to hold the Plavix as needed for his procedure.  However, I would not want him to hold ASA.

## 2016-05-31 NOTE — Telephone Encounter (Signed)
Spoke with pt letting him know it's ok to hold plavix but not aspirin, pt wanted to wait until he sees Dr Warren Lacy in March, before he decide if he's going to do the surgery or not.

## 2016-06-14 ENCOUNTER — Telehealth: Payer: Self-pay | Admitting: Cardiology

## 2016-06-14 DIAGNOSIS — N281 Cyst of kidney, acquired: Secondary | ICD-10-CM | POA: Diagnosis not present

## 2016-06-14 DIAGNOSIS — N486 Induration penis plastica: Secondary | ICD-10-CM | POA: Diagnosis not present

## 2016-06-14 DIAGNOSIS — N401 Enlarged prostate with lower urinary tract symptoms: Secondary | ICD-10-CM | POA: Diagnosis not present

## 2016-06-14 DIAGNOSIS — R972 Elevated prostate specific antigen [PSA]: Secondary | ICD-10-CM | POA: Diagnosis not present

## 2016-06-14 DIAGNOSIS — N323 Diverticulum of bladder: Secondary | ICD-10-CM | POA: Diagnosis not present

## 2016-06-14 DIAGNOSIS — N200 Calculus of kidney: Secondary | ICD-10-CM | POA: Diagnosis not present

## 2016-06-14 NOTE — Telephone Encounter (Signed)
New Message     Pt is having prostate biopsy in office   Request for surgical clearance:  1. What type of surgery is being performed? Prostate biopsy  2. When is this surgery scheduled? Couple weeks   3. Are there any medications that need to be held prior to surgery and how long? 7 days prior   4. Name of physician performing surgery? Dr Tresa Moore  5. What is your office phone and fax number? Fax [phone]

## 2016-06-14 NOTE — Telephone Encounter (Signed)
Faxed last telephone notes to provider's office (discussion of clearance and advice from Dr. Percival Spanish)

## 2016-06-15 ENCOUNTER — Observation Stay (HOSPITAL_COMMUNITY)
Admission: EM | Admit: 2016-06-15 | Discharge: 2016-06-16 | Disposition: A | Discharge status: Home / Self Care | Payer: PPO | Attending: Internal Medicine | Admitting: Internal Medicine

## 2016-06-15 ENCOUNTER — Emergency Department (HOSPITAL_COMMUNITY): Payer: PPO

## 2016-06-15 ENCOUNTER — Encounter (HOSPITAL_COMMUNITY): Payer: Self-pay | Admitting: Emergency Medicine

## 2016-06-15 DIAGNOSIS — I471 Supraventricular tachycardia: Secondary | ICD-10-CM | POA: Diagnosis not present

## 2016-06-15 DIAGNOSIS — I129 Hypertensive chronic kidney disease with stage 1 through stage 4 chronic kidney disease, or unspecified chronic kidney disease: Secondary | ICD-10-CM | POA: Insufficient documentation

## 2016-06-15 DIAGNOSIS — R55 Syncope and collapse: Principal | ICD-10-CM | POA: Diagnosis present

## 2016-06-15 DIAGNOSIS — E785 Hyperlipidemia, unspecified: Secondary | ICD-10-CM | POA: Insufficient documentation

## 2016-06-15 DIAGNOSIS — Z79899 Other long term (current) drug therapy: Secondary | ICD-10-CM | POA: Diagnosis not present

## 2016-06-15 DIAGNOSIS — I255 Ischemic cardiomyopathy: Secondary | ICD-10-CM | POA: Diagnosis not present

## 2016-06-15 DIAGNOSIS — I2510 Atherosclerotic heart disease of native coronary artery without angina pectoris: Secondary | ICD-10-CM | POA: Insufficient documentation

## 2016-06-15 DIAGNOSIS — F4310 Post-traumatic stress disorder, unspecified: Secondary | ICD-10-CM | POA: Insufficient documentation

## 2016-06-15 DIAGNOSIS — R918 Other nonspecific abnormal finding of lung field: Secondary | ICD-10-CM | POA: Diagnosis not present

## 2016-06-15 DIAGNOSIS — I252 Old myocardial infarction: Secondary | ICD-10-CM | POA: Diagnosis not present

## 2016-06-15 DIAGNOSIS — Z8249 Family history of ischemic heart disease and other diseases of the circulatory system: Secondary | ICD-10-CM | POA: Insufficient documentation

## 2016-06-15 DIAGNOSIS — Z7902 Long term (current) use of antithrombotics/antiplatelets: Secondary | ICD-10-CM | POA: Insufficient documentation

## 2016-06-15 DIAGNOSIS — H409 Unspecified glaucoma: Secondary | ICD-10-CM | POA: Diagnosis not present

## 2016-06-15 DIAGNOSIS — Z955 Presence of coronary angioplasty implant and graft: Secondary | ICD-10-CM | POA: Insufficient documentation

## 2016-06-15 DIAGNOSIS — K219 Gastro-esophageal reflux disease without esophagitis: Secondary | ICD-10-CM | POA: Insufficient documentation

## 2016-06-15 DIAGNOSIS — Z7982 Long term (current) use of aspirin: Secondary | ICD-10-CM | POA: Diagnosis not present

## 2016-06-15 DIAGNOSIS — N183 Chronic kidney disease, stage 3 (moderate): Secondary | ICD-10-CM | POA: Diagnosis not present

## 2016-06-15 DIAGNOSIS — Z8679 Personal history of other diseases of the circulatory system: Secondary | ICD-10-CM

## 2016-06-15 DIAGNOSIS — R404 Transient alteration of awareness: Secondary | ICD-10-CM | POA: Diagnosis not present

## 2016-06-15 DIAGNOSIS — R531 Weakness: Secondary | ICD-10-CM | POA: Diagnosis not present

## 2016-06-15 DIAGNOSIS — R42 Dizziness and giddiness: Secondary | ICD-10-CM | POA: Diagnosis not present

## 2016-06-15 DIAGNOSIS — J189 Pneumonia, unspecified organism: Secondary | ICD-10-CM

## 2016-06-15 HISTORY — DX: Hyperlipidemia, unspecified: E78.5

## 2016-06-15 HISTORY — DX: Unspecified glaucoma: H40.9

## 2016-06-15 LAB — CBC WITH DIFFERENTIAL/PLATELET
Basophils Absolute: 0 10*3/uL (ref 0.0–0.1)
Basophils Relative: 0 %
EOS ABS: 0.1 10*3/uL (ref 0.0–0.7)
EOS PCT: 1 %
HCT: 42 % (ref 39.0–52.0)
Hemoglobin: 14.1 g/dL (ref 13.0–17.0)
Lymphocytes Relative: 17 %
Lymphs Abs: 1.5 10*3/uL (ref 0.7–4.0)
MCH: 31.5 pg (ref 26.0–34.0)
MCHC: 33.6 g/dL (ref 30.0–36.0)
MCV: 93.8 fL (ref 78.0–100.0)
MONO ABS: 0.6 10*3/uL (ref 0.1–1.0)
Monocytes Relative: 7 %
Neutro Abs: 6.6 10*3/uL (ref 1.7–7.7)
Neutrophils Relative %: 75 %
PLATELETS: 176 10*3/uL (ref 150–400)
RBC: 4.48 MIL/uL (ref 4.22–5.81)
RDW: 13.2 % (ref 11.5–15.5)
WBC: 8.8 10*3/uL (ref 4.0–10.5)

## 2016-06-15 LAB — COMPREHENSIVE METABOLIC PANEL
ALT: 16 U/L — AB (ref 17–63)
AST: 20 U/L (ref 15–41)
Albumin: 4 g/dL (ref 3.5–5.0)
Alkaline Phosphatase: 64 U/L (ref 38–126)
Anion gap: 6 (ref 5–15)
BUN: 19 mg/dL (ref 6–20)
CHLORIDE: 106 mmol/L (ref 101–111)
CO2: 29 mmol/L (ref 22–32)
CREATININE: 1.27 mg/dL — AB (ref 0.61–1.24)
Calcium: 9.2 mg/dL (ref 8.9–10.3)
GFR calc non Af Amer: 55 mL/min — ABNORMAL LOW (ref >60)
Glucose, Bld: 93 mg/dL (ref 65–99)
Potassium: 4.2 mmol/L (ref 3.5–5.1)
SODIUM: 141 mmol/L (ref 135–145)
Total Bilirubin: 0.7 mg/dL (ref 0.3–1.2)
Total Protein: 6.8 g/dL (ref 6.5–8.1)

## 2016-06-15 LAB — I-STAT TROPONIN, ED: TROPONIN I, POC: 0 ng/mL (ref 0.00–0.08)

## 2016-06-15 LAB — TSH: TSH: 1.074 u[IU]/mL (ref 0.350–4.500)

## 2016-06-15 LAB — I-STAT CG4 LACTIC ACID, ED
LACTIC ACID, VENOUS: 0.74 mmol/L (ref 0.5–1.9)
Lactic Acid, Venous: 0.96 mmol/L (ref 0.5–1.9)

## 2016-06-15 LAB — BRAIN NATRIURETIC PEPTIDE: B Natriuretic Peptide: 19.6 pg/mL (ref 0.0–100.0)

## 2016-06-15 MED ORDER — LATANOPROST 0.005 % OP SOLN
1.0000 [drp] | Freq: Every evening | OPHTHALMIC | Status: DC
Start: 2016-06-15 — End: 2016-06-16
  Administered 2016-06-15: 1 [drp] via OPHTHALMIC
  Filled 2016-06-15: qty 2.5

## 2016-06-15 MED ORDER — ACETAMINOPHEN 325 MG PO TABS: 650.0000 mg | ORAL_TABLET | Freq: Four times a day (QID) | ORAL | Status: DC | PRN

## 2016-06-15 MED ORDER — ALPRAZOLAM 0.5 MG PO TABS: 0.5000 mg | ORAL_TABLET | ORAL | Status: DC | PRN

## 2016-06-15 MED ORDER — CLOPIDOGREL BISULFATE 75 MG PO TABS
75.0000 mg | ORAL_TABLET | Freq: Every day | ORAL | Status: DC
  Administered 2016-06-16: 75 mg via ORAL
  Filled 2016-06-15: qty 1

## 2016-06-15 MED ORDER — ENOXAPARIN SODIUM 40 MG/0.4ML ~~LOC~~ SOLN: 40.0000 mg | SUBCUTANEOUS | Status: DC

## 2016-06-15 MED ORDER — LACTATED RINGERS IV SOLN
INTRAVENOUS | Status: DC
Start: 2016-06-15 — End: 2016-06-16
  Administered 2016-06-15 – 2016-06-16 (×2): via INTRAVENOUS

## 2016-06-15 MED ORDER — FAMOTIDINE 20 MG PO TABS
40.0000 mg | ORAL_TABLET | Freq: Every day | ORAL | Status: DC | PRN
  Administered 2016-06-15: 40 mg via ORAL
  Filled 2016-06-15: qty 2

## 2016-06-15 MED ORDER — ACETAMINOPHEN 650 MG RE SUPP: 650.0000 mg | Freq: Four times a day (QID) | RECTAL | Status: DC | PRN

## 2016-06-15 MED ORDER — ASPIRIN EC 81 MG PO TBEC
81.0000 mg | DELAYED_RELEASE_TABLET | Freq: Every day | ORAL | Status: DC
  Administered 2016-06-15: 81 mg via ORAL
  Filled 2016-06-15: qty 1

## 2016-06-15 MED ORDER — ASPIRIN EC 81 MG PO TBEC: 81.0000 mg | DELAYED_RELEASE_TABLET | Freq: Every day | ORAL | Status: DC

## 2016-06-15 MED ORDER — MORPHINE SULFATE (PF) 2 MG/ML IV SOLN: 2.0000 mg | INTRAVENOUS | Status: DC | PRN

## 2016-06-15 MED ORDER — SODIUM CHLORIDE 0.9% FLUSH
3.0000 mL | Freq: Two times a day (BID) | INTRAVENOUS | Status: DC
  Administered 2016-06-15: 3 mL via INTRAVENOUS

## 2016-06-15 MED ORDER — PRAVASTATIN SODIUM 40 MG PO TABS
40.0000 mg | ORAL_TABLET | Freq: Every day | ORAL | Status: DC
  Administered 2016-06-15: 40 mg via ORAL
  Filled 2016-06-15: qty 1

## 2016-06-15 MED ORDER — TIMOLOL MALEATE 0.5 % OP SOLN
1.0000 [drp] | Freq: Every day | OPHTHALMIC | Status: DC
  Administered 2016-06-16: 1 [drp] via OPHTHALMIC
  Filled 2016-06-15: qty 5

## 2016-06-15 NOTE — ED Triage Notes (Signed)
Per EMS- pt was at [place]. Pt received upsetting news about his benefits. Pt began to feel dizzy and diaphoretic. Bystanders told pt he had slurred speech. Pt became very weak. Pt is not a diabetic. CBG 87. Symptoms are resolved except for bilateral arm weakness. Denies chest pain.

## 2016-06-15 NOTE — H&P (Signed)
History and Physical    Trevor Branch [id number] DOB: October 17, 1945 DOA: 06/15/2016  PCP: Orpah Melter, MD; [facility]:  [facility] - cardiology; [facility] Patient coming from: home - lives with wife; NOK: wife, [phone]  Chief Complaint: near syncope  HPI: Trevor Branch is a 71 y.o. male with medical history significant of SV s/p ablation, PTSD, HLD, CAD with stents in 3/17, and GERD presenting with pre-syncope.  Patient was at the [place] today and he went to a group session.  They also have dental benefits and so he went to check on those benefits.  While he was going, he noticed that he was a bit light-headed.  He started getting woozier and woozier.  The person at the dental desk was describing a problem with his qualifications and so he walked back to the main desk - but felt drunk, light-headed, weak all over, arms and legs heavy, things getting hazy.  He started talking to someone and he was slurring speech and they called 911.  Also diaphoretic.  No h/o similar.  Thought he was going to pass out but he never did.  No specific unilateral symptoms.  Has a headache now, worse than while he was at the [place].  No chest pain.  Hazy vision.  No dysphagia.   He reports that he also did not feel well last night - restless, difficulty sleeping.  Has h/o PTSD and so this is not uncommon.  He is also very stressed.  Saw urology yesterday, BP was high.  Very unusual for him.  BP was 150/90 at [facility] today,usually 120/70.  Urologist is planning to biopsy the prostate, PSA is elevated at 11.5, previously 4.5 in 1/17, 6.72 in 10/17.  1 year ago he had stents x 3.  No stress test or catheterization since.  ED Course: Marked HTN, otherwise reassuring evaluation  Review of Systems: As per HPI; otherwise 10 point review of systems reviewed and negative.   Ambulatory Status:  Ambulates without difficulty  Past Medical History:  Diagnosis Date  . CAD (coronary artery disease)    a.  NSTEMI 3/17 - s/p DES x 2 to mid LAD; staged PCI with DES to OM1; residual CAD - mLCX 40%, pRCA 30%, mRCA 20%   . GERD (gastroesophageal reflux disease)   . Glaucoma   . History of non-ST elevation myocardial infarction (NSTEMI) 06/2015  . Hyperlipidemia   . Ischemic cardiomyopathy    a. Echo 06/23/15 - EF 35-40%, ant-septal, inferoseptal, apical AK, Gr 1 DD, trivial AI >>  b. Echo 5/17 - EF 55-60%, no RWMA, Gr 1 DD  . PTSD (post-traumatic stress disorder)    panic attacks  . SVT (supraventricular tachycardia) (HCC)    s/p prior RFCA    Past Surgical History:  Procedure Laterality Date  . CARDIAC CATHETERIZATION [name] 06/22/2015   Procedure: Left Heart Cath and Coronary Angiography;  Surgeon: Jettie Booze, MD;  Location: [place] [facility];  Service: Cardiovascular;  Laterality: [name];  . CARDIAC CATHETERIZATION [name] 06/22/2015   Procedure: Coronary Stent Intervention;  Surgeon: Jettie Booze, MD;  Location: [place] [facility] LAB;  Service: Cardiovascular;  Laterality: [name];  . CARDIAC CATHETERIZATION [name] 06/24/2015   Procedure: Left Heart Cath and Coronary Angiography;  Surgeon: Troy Sine, MD;  Location: [place] [facility];  Service: Cardiovascular;  Laterality: [name];  . CARDIAC CATHETERIZATION [name] 06/24/2015   Procedure: Coronary Stent Intervention;  Surgeon: Troy Sine, MD;  Location: [place] CV  LAB;  Service: Cardiovascular;  Laterality: [name];  . SUPRAVENTRICULAR TACHYCARDIA ABLATION  2009   at [place] History  . Marital status: Married    Spouse name: [name]  . Number of children: [name]  . Years of education: [name]   Occupational History  . Retired    Social History Main Topics  . Smoking status: Never Smoker  . Smokeless tobacco: Never Used  . Alcohol use No  . Drug use: No  . Sexual activity: Not on file   Other Topics Concern  . Not on file   Social History Narrative   Active, likes to walk and kayak. Lives with wife.    Allergies    Allergen Reactions  . Other Other (See Comments)    Uncoded Allergy. Allergen: VERSED, Other Reaction: shaking Uncoded Allergy. Allergen: ECG adhesive pads, Other Reaction: skin redness and tearing  . Atorvastatin     REACTION: intolerant  . Doxycycline     GI upset  . Mucinex [Guaifenesin Er] Hives  . Rosuvastatin     REACTION: intolerant  . Verapamil Hcl Er   . Imitrex [Sumatriptan] Palpitations    Family History  Problem Relation Age of Onset  . CAD Mother   . Urinary tract infection Mother 61  . CAD Father 66    first MI at age 53  . Hypertension Father   . CAD Brother 19  . Diabetes type II Neg Hx     Prior to Admission medications   Medication Sig Start Date End Date Taking? Authorizing Provider  ALPRAZolam Duanne Moron) 0.5 MG tablet Take 0.5 mg by mouth as needed for anxiety.   Yes Historical Provider, MD  aspirin EC 81 MG tablet Take 1 tablet (81 mg total) by mouth daily. 10/16/15  Yes Burnell Blanks, MD  clopidogrel (PLAVIX) 75 MG tablet TAKE ONE TABLET BY MOUTH ONCE DAILY 04/29/16  Yes Minus Breeding, MD  famotidine (PEPCID) 40 MG tablet Take 1 tablet (40 mg total) by mouth 2 (two) times daily. Patient taking differently: Take 40 mg by mouth daily as needed for heartburn or indigestion.  10/07/15  Yes Minus Breeding, MD  latanoprost (XALATAN) 0.005 % ophthalmic solution Place 1 drop into both eyes daily.   Yes Historical Provider, MD  pravastatin (PRAVACHOL) 40 MG tablet TAKE ONE TABLET BY MOUTH ONCE DAILY IN THE EVENING 04/29/16  Yes Minus Breeding, MD  timolol (TIMOPTIC) 0.5 % ophthalmic solution Place 1 drop into both eyes daily.   Yes Historical Provider, MD    Physical Exam: Vitals:   06/15/16 2016 06/15/16 2017 06/15/16 2019 06/15/16 2022  BP: (!) 144/76 136/72 120/73 135/71  Pulse: (!) 58 (!) 58 74 77  Resp: 18 18 18 18   Temp: 98.7 F (37.1 C)     TempSrc: Oral     SpO2: 98% 100% 100% 100%     General:  Appears calm and comfortable and is NAD Eyes:   PERRL, EOMI, normal lids, iris ENT:  grossly normal hearing, lips & tongue, mmm Neck:  no LAD, masses or thyromegaly Cardiovascular:  RRR, no m/r/g. No LE edema.  Respiratory:  CTA bilaterally, no w/r/r. Normal respiratory effort. Abdomen:  soft, ntnd, NABS Skin:  no rash or induration seen on limited exam Musculoskeletal:  grossly normal tone BUE/BLE, good ROM, no bony abnormality Psychiatric:  grossly normal mood and affect, speech fluent and appropriate, AOx3 Neurologic:  CN 2-12 grossly intact, moves all extremities in coordinated fashion, sensation  intact  Labs on Admission: I have personally reviewed following labs and imaging studies  CBC:  Recent Labs Lab 06/15/16 1730  WBC 8.8  NEUTROABS 6.6  HGB 14.1  HCT 42.0  MCV 93.8  PLT 193   Basic Metabolic Panel:  Recent Labs Lab 06/15/16 1730  NA 141  K 4.2  CL 106  CO2 29  GLUCOSE 93  BUN 19  CREATININE 1.27*  CALCIUM 9.2   GFR: CrCl cannot be calculated (Unknown ideal weight.). Liver Function Tests:  Recent Labs Lab 06/15/16 1730  AST 20  ALT 16*  ALKPHOS 64  BILITOT 0.7  PROT 6.8  ALBUMIN 4.0   No results for input(s): LIPASE, AMYLASE in the last 168 hours. No results for input(s): AMMONIA in the last 168 hours. Coagulation Profile: No results for input(s): INR, PROTIME in the last 168 hours. Cardiac Enzymes: No results for input(s): CKTOTAL, CKMB, CKMBINDEX, TROPONINI in the last 168 hours. BNP (last 3 results) No results for input(s): PROBNP in the last 8760 hours. HbA1C: No results for input(s): HGBA1C in the last 72 hours. CBG: No results for input(s): GLUCAP in the last 168 hours. Lipid Profile: No results for input(s): CHOL, HDL, LDLCALC, TRIG, CHOLHDL, LDLDIRECT in the last 72 hours. Thyroid Function Tests:  Recent Labs  06/15/16 2154  TSH 1.074   Anemia Panel: No results for input(s): VITAMINB12, FOLATE, FERRITIN, TIBC, IRON, RETICCTPCT in the last 72 hours. Urine analysis: No  results found for: COLORURINE, APPEARANCEUR, LABSPEC, PHURINE, GLUCOSEU, HGBUR, BILIRUBINUR, KETONESUR, PROTEINUR, UROBILINOGEN, NITRITE, LEUKOCYTESUR  Creatinine Clearance: CrCl cannot be calculated (Unknown ideal weight.).  Sepsis Labs: @LABRCNTIP (procalcitonin:4,lacticidven:4) )No results found for this or any previous visit (from the past 240 hour(s)).   Radiological Exams on Admission: Dg Chest 2 View  Result Date: 06/15/2016 CLINICAL DATA:  Near syncope, fatigue, weakness EXAM: CHEST  2 VIEW COMPARISON:  CT chest of 07/09/2015 and chest x-ray of 06/20/2015 FINDINGS: No active infiltrate or effusion is seen. On the lateral view there is and opacity is just above the hemidiaphragm overlying the heart shadow, not seen previously. This may represent a focus of scarring or possibly and area of pneumonia in attention this area on followup chest x-ray is recommended. No pleural effusion is seen. Mediastinal and hilar contours are unremarkable. The heart is within normal limits in size. No acute bony abnormality is seen. IMPRESSION: 1. Vague opacity at the lung base on the lateral view overlying the heart shadow not seen previously. Possible scarring or focus of pneumonia. Recommend attention to this area on followup chest x-ray. 2. The heart is within normal limits in size. Electronically Signed   By: Ivar Drape M.D.   On: 06/15/2016 17:05   Ct Head Wo Contrast  Result Date: 06/15/2016 CLINICAL DATA:  syncope today while walking, altered mental status EXAM: CT HEAD WITHOUT CONTRAST TECHNIQUE: Contiguous axial images were obtained from the base of the skull through the vertex without intravenous contrast. COMPARISON:  None. FINDINGS: Brain: No intracranial hemorrhage, mass effect or midline shift. No acute cortical infarction. No mass lesion is noted on this unenhanced scan. The gray and white-matter differentiation is preserved. Vascular: No hyperdense vessel or unexpected calcification. Skull: No  skull fracture is noted.  No destructive bony lesion. Sinuses/Orbits: There is probable mucous retention cyst medial wall of the right maxillary sinus measures 9 mm. No paranasal sinuses air-fluid levels. Mastoid air cells are unremarkable. Other: None IMPRESSION: No acute intracranial abnormality. No definite acute cortical infarction. There probable  mucous retention cyst medial aspect of the right maxillary sinus measures 9 mm. Electronically Signed   By: Lahoma Crocker M.D.   On: 06/15/2016 17:12    EKG: Independently reviewed.  NSR with rate 59;RBBB,  nonspecific ST changes with no evidence of acute ischemia  Assessment/Plan Principal Problem:   Near syncope     -Etiology is not clear.  -The differential diagnosis is broad, including vasovagal syncope, seizure, TIA/stroke, arrhythmia, ACS, alcohol intoxication, drug abuse, orthostatic status, carotid artery stenosis, transient hypertensive encephalopathy. -By the [facility] syncope rule, the patient is at low risk for serious outcome. -Given his prior NSTEMI with 3 stents 1 year ago and h/o SVT s/p ablation, it is reasonable to observe overnight. -Will monitor on telemetry -Orthostatic vital signs in AM -Neuro checks  -Creatinine 1.27, stable -Normal TSH -If no further issues overnight, likely appropriate for d/c in the AM. -If his BP remains elevated, which he reports is very out of character for him, he may need BP medication.  DVT prophylaxis:  Lovenox Code Status:  Full - confirmed with patient/family Family Communication: Wife and step-son present throughout evaluation Disposition Plan:  Home once clinically improved Consults called: None  Admission status: It is my clinical opinion that referral for OBSERVATION is reasonable and necessary in this patient based on the above information provided. The aforementioned taken together are felt to place the patient at high risk for further clinical deterioration. However it is anticipated  that the patient may be medically stable for discharge from the hospital within 24 to 48 hours.    Karmen Bongo MD [facility]  If 7PM-7AM, please contact night-coverage [email or web address] Password [email or web address]  06/16/2016, 1:42 AM

## 2016-06-15 NOTE — ED Provider Notes (Signed)
[facility] DEPT Provider Note   CSN: [id number] Arrival date & time: 06/15/16  1618     History   Chief Complaint Chief Complaint  Patient presents with  . Dizziness  . Weakness    HPI Trevor Branch is a 71 y.o. male.  HPI 71 year old male with history of SVT, ischemic cardiomyopathy, coronary artery disease status post N STEMI, who presents with transient near syncopal episode. The patient was at the [place] today for a routine checkup. He states that he was talking to someone while standing when he experienced acute onset of lightheadedness and a sensation that he was "drunk." He felt extremely weak, like he was going to pass out, along with mild shortness of breath and diaphoresis. He states that he sweat through his entire shirt. The person that was starting to M asked him if he was okay then evaluated him. His glucose was normal and he does not take insulin. He states he felt generally weak and his bilateral upper and lower extremities and like he could not walk. He also noticed slurred speech and difficulty getting his words out. This lasted approximately 30 minutes, then resolved. Since then, he has not had recurrence of his symptoms. He has been taking his medications, including his Plavix, as prescribed. Denies any chest pain during this episode. Denies any focal numbness or weakness.  Past Medical History:  Diagnosis Date  . CAD (coronary artery disease)    a. NSTEMI 3/17 - s/p DES x 2 to mid LAD; staged PCI with DES to OM1; residual CAD - mLCX 40%, pRCA 30%, mRCA 20%   . GERD (gastroesophageal reflux disease)   . Glaucoma   . History of non-ST elevation myocardial infarction (NSTEMI) 06/2015  . Hyperlipidemia   . Ischemic cardiomyopathy    a. Echo 06/23/15 - EF 35-40%, ant-septal, inferoseptal, apical AK, Gr 1 DD, trivial AI >>  b. Echo 5/17 - EF 55-60%, no RWMA, Gr 1 DD  . PTSD (post-traumatic stress disorder)    panic attacks  . SVT (supraventricular tachycardia) [facility])    s/p prior RFCA    Patient Active Problem List   Diagnosis Date Noted  . Near syncope 06/15/2016  . Cough 08/06/2015  . Coronary artery disease involving native coronary artery of native heart without angina pectoris 07/09/2015  . Ischemic cardiomyopathy 07/09/2015  . Hyperlipidemia 07/09/2015  . NSTEMI (non-ST elevated myocardial infarction) ([place])   . Chest pain 06/20/2015  . Bradycardia 06/20/2015  . Glaucoma 06/20/2015  . HEADACHE 09/11/2007  . HYPERCHOLESTEROLEMIA 12/28/2006  . ANXIETY 12/28/2006  . REFLUX ESOPHAGITIS 12/28/2006  . DIVERTICULOSIS, COLON 12/28/2006  . PALPITATIONS 12/28/2006  . NEPHROLITHIASIS, HX OF 12/28/2006    Past Surgical History:  Procedure Laterality Date  . CARDIAC CATHETERIZATION [name] 06/22/2015   Procedure: Left Heart Cath and Coronary Angiography;  Surgeon: Jettie Booze, MD;  Location: [facility];  Service: Cardiovascular;  Laterality: [name];  . CARDIAC CATHETERIZATION [name] 06/22/2015   Procedure: Coronary Stent Intervention;  Surgeon: Jettie Booze, MD;  Location: [facility];  Service: Cardiovascular;  Laterality: [name];  . CARDIAC CATHETERIZATION [name] 06/24/2015   Procedure: Left Heart Cath and Coronary Angiography;  Surgeon: Troy Sine, MD;  Location: [facility];  Service: Cardiovascular;  Laterality: [name];  . CARDIAC CATHETERIZATION [name] 06/24/2015   Procedure: Coronary Stent Intervention;  Surgeon: Troy Sine, MD;  Location: [facility];  Service: Cardiovascular;  Laterality: [name];  . SUPRAVENTRICULAR TACHYCARDIA ABLATION  2009   at [facility] Medications    Prior to Admission medications   Medication Sig Start Date End Date Taking? Authorizing Provider  ALPRAZolam Duanne Moron) 0.5 MG tablet Take 0.5 mg by mouth as needed for anxiety.   Yes Historical Provider, MD  aspirin EC 81 MG tablet Take 1 tablet (81 mg total) by mouth daily. 10/16/15  Yes Burnell Blanks, MD  clopidogrel (PLAVIX) 75 MG  tablet TAKE ONE TABLET BY MOUTH ONCE DAILY 04/29/16  Yes Minus Breeding, MD  famotidine (PEPCID) 40 MG tablet Take 1 tablet (40 mg total) by mouth 2 (two) times daily. Patient taking differently: Take 40 mg by mouth daily as needed for heartburn or indigestion.  10/07/15  Yes Minus Breeding, MD  latanoprost (XALATAN) 0.005 % ophthalmic solution Place 1 drop into both eyes daily.   Yes Historical Provider, MD  pravastatin (PRAVACHOL) 40 MG tablet TAKE ONE TABLET BY MOUTH ONCE DAILY IN THE EVENING 04/29/16  Yes Minus Breeding, MD  timolol (TIMOPTIC) 0.5 % ophthalmic solution Place 1 drop into both eyes daily.   Yes Historical Provider, MD    Family History Family History  Problem Relation Age of Onset  . CAD Mother   . Urinary tract infection Mother 59  . CAD Father 59    first MI at age 41  . Hypertension Father   . CAD Brother 26  . Diabetes type II Neg Hx     Social History Social History  Substance Use Topics  . Smoking status: Never Smoker  . Smokeless tobacco: Never Used  . Alcohol use No     Allergies   Other; Atorvastatin; Doxycycline; Mucinex [guaifenesin er]; Rosuvastatin; Verapamil hcl er; and Imitrex [sumatriptan]   Review of Systems Review of Systems  Constitutional: Positive for fatigue. Negative for chills and fever.  HENT: Negative for congestion and rhinorrhea.   Eyes: Negative for visual disturbance.  Respiratory: Negative for cough, shortness of breath and wheezing.   Cardiovascular: Negative for chest pain and leg swelling.  Gastrointestinal: Positive for nausea. Negative for abdominal pain, diarrhea and vomiting.  Genitourinary: Negative for dysuria and flank pain.  Musculoskeletal: Negative for neck pain and neck stiffness.  Skin: Negative for rash and wound.  Allergic/Immunologic: Negative for immunocompromised state.  Neurological: Positive for speech difficulty, weakness and light-headedness. Negative for syncope and headaches.  All other systems  reviewed and are negative.    Physical Exam Updated Vital Signs BP 135/71 (BP Location: Left Arm)   Pulse 77   Temp 98.7 F (37.1 C) (Oral)   Resp 18   SpO2 100%   Physical Exam  Constitutional: He is oriented to person, place, and time. He appears well-developed and well-nourished. No distress.  HENT:  Head: Normocephalic and atraumatic.  Eyes: Conjunctivae are normal.  Neck: Neck supple.  Cardiovascular: Normal rate, regular rhythm and normal heart sounds.  Exam reveals no friction rub.   No murmur heard. Pulmonary/Chest: Effort normal and breath sounds normal. No respiratory distress. He has no wheezes. He has no rales.  Abdominal: He exhibits no distension.  Musculoskeletal: He exhibits no edema.  Neurological: He is alert and oriented to person, place, and time. He has normal strength. No cranial nerve deficit or sensory deficit. He exhibits normal muscle tone. Coordination and gait normal. GCS eye subscore is 4. GCS verbal subscore is 5. GCS motor subscore is 6.  Skin: Skin is warm. Capillary refill takes less than 2 seconds.  Psychiatric:  He has a normal mood and affect.  Nursing note and vitals reviewed.    ED Treatments / Results  Labs (all labs ordered are listed, but only abnormal results are displayed) Labs Reviewed  COMPREHENSIVE METABOLIC PANEL - Abnormal; Notable for the following:       Result Value   Creatinine, Ser 1.27 (*)    ALT 16 (*)    GFR calc non Af Amer 55 (*)    All other components within normal limits  CBC WITH DIFFERENTIAL/PLATELET  BRAIN NATRIURETIC PEPTIDE  TSH  BASIC METABOLIC PANEL  CBC  I-STAT TROPOININ, ED  I-STAT CG4 LACTIC ACID, ED  I-STAT CG4 LACTIC ACID, ED    EKG  EKG Interpretation  Date/Time:  Wednesday June 15 2016 16:30:22 EST Ventricular Rate:  59 PR Interval:    QRS Duration: 121 QT Interval:  423 QTC Calculation: 419 R Axis:   66 Text Interpretation:  Normal sinus rhythm IVCD, consider atypical RBBB  Borderline ST elevation, lateral leads Since last EKG: TWI in lateral leads have resolved Confirmed by Parthiv Mucci MD, Jovani Flury [id number]) on 06/15/2016 4:54:34 PM       Radiology Dg Chest 2 View  Result Date: 06/15/2016 CLINICAL DATA:  Near syncope, fatigue, weakness EXAM: CHEST  2 VIEW COMPARISON:  CT chest of 07/09/2015 and chest x-ray of 06/20/2015 FINDINGS: No active infiltrate or effusion is seen. On the lateral view there is and opacity is just above the hemidiaphragm overlying the heart shadow, not seen previously. This may represent a focus of scarring or possibly and area of pneumonia in attention this area on followup chest x-ray is recommended. No pleural effusion is seen. Mediastinal and hilar contours are unremarkable. The heart is within normal limits in size. No acute bony abnormality is seen. IMPRESSION: 1. Vague opacity at the lung base on the lateral view overlying the heart shadow not seen previously. Possible scarring or focus of pneumonia. Recommend attention to this area on followup chest x-ray. 2. The heart is within normal limits in size. Electronically Signed   By: Ivar Drape M.D.   On: 06/15/2016 17:05   Ct Head Wo Contrast  Result Date: 06/15/2016 CLINICAL DATA:  syncope today while walking, altered mental status EXAM: CT HEAD WITHOUT CONTRAST TECHNIQUE: Contiguous axial images were obtained from the base of the skull through the vertex without intravenous contrast. COMPARISON:  None. FINDINGS: Brain: No intracranial hemorrhage, mass effect or midline shift. No acute cortical infarction. No mass lesion is noted on this unenhanced scan. The gray and white-matter differentiation is preserved. Vascular: No hyperdense vessel or unexpected calcification. Skull: No skull fracture is noted.  No destructive bony lesion. Sinuses/Orbits: There is probable mucous retention cyst medial wall of the right maxillary sinus measures 9 mm. No paranasal sinuses air-fluid levels. Mastoid air cells are  unremarkable. Other: None IMPRESSION: No acute intracranial abnormality. No definite acute cortical infarction. There probable mucous retention cyst medial aspect of the right maxillary sinus measures 9 mm. Electronically Signed   By: Lahoma Crocker M.D.   On: 06/15/2016 17:12    Procedures Procedures (including critical care time)  Medications Ordered in ED Medications  timolol (TIMOPTIC) 0.5 % ophthalmic solution 1 drop (not administered)  clopidogrel (PLAVIX) tablet 75 mg (not administered)  pravastatin (PRAVACHOL) tablet 40 mg (40 mg Oral Given 06/15/16 2207)  famotidine (PEPCID) tablet 40 mg (40 mg Oral Given 06/15/16 2205)  ALPRAZolam (XANAX) tablet 0.5 mg (not administered)  latanoprost (XALATAN) 0.005 % ophthalmic solution 1 drop (  1 drop Both Eyes Given 06/15/16 2206)  enoxaparin (LOVENOX) injection 40 mg (40 mg Subcutaneous Not Given 06/15/16 2205)  sodium chloride flush (NS) 0.9 % injection 3 mL (3 mLs Intravenous Given 06/15/16 2205)  lactated ringers infusion ( Intravenous New Bag/Given 06/15/16 2028)  acetaminophen (TYLENOL) tablet 650 mg (not administered)    Or  acetaminophen (TYLENOL) suppository 650 mg (not administered)  morphine 2 MG/ML injection 2 mg (not administered)  aspirin EC tablet 81 mg (81 mg Oral Given 06/15/16 2205)     Initial Impression / Assessment and Plan / ED Course  I have reviewed the triage vital signs and the nursing notes.  Pertinent labs & imaging results that were available during my care of the patient were reviewed by me and considered in my medical decision making (see chart for details).    71 year old male with past medical history as above who presents with transient near syncopal episode with associated lightheadedness, dizziness, and diaphoresis. Etiology of his episode is unclear. Must consider primary cardiac etiology such as transient arrhythmia versus atypical angina, also must consider TIA with possible cerebellar involvement, although he had no  focal deficits other than slurred speech. He does not appear to be hypoglycemic and is not on insulin. He is currently asymptomatic. Will check broad labs and likely need admission for observation. Of note, patient markedly hypertensive, so transient hypertensive encephalopathy is also on the differential, though he had no headache.   EKG reviewed and shows overall improved right bundle since last EKG. T wave inversions in anterolateral leads have resolved.  Labs overall reassuring and unremarkable. Given his high risk history, will admit for observation.  Final Clinical Impressions(s) / ED Diagnoses   Final diagnoses:  Near syncope  History of coronary artery disease    New Prescriptions Current Discharge Medication List       Duffy Bruce, MD 06/15/16 2359

## 2016-06-16 ENCOUNTER — Observation Stay (HOSPITAL_BASED_OUTPATIENT_CLINIC_OR_DEPARTMENT_OTHER): Payer: PPO

## 2016-06-16 ENCOUNTER — Observation Stay (HOSPITAL_COMMUNITY): Payer: PPO

## 2016-06-16 DIAGNOSIS — E785 Hyperlipidemia, unspecified: Secondary | ICD-10-CM | POA: Diagnosis not present

## 2016-06-16 DIAGNOSIS — I252 Old myocardial infarction: Secondary | ICD-10-CM | POA: Diagnosis not present

## 2016-06-16 DIAGNOSIS — I129 Hypertensive chronic kidney disease with stage 1 through stage 4 chronic kidney disease, or unspecified chronic kidney disease: Secondary | ICD-10-CM | POA: Diagnosis not present

## 2016-06-16 DIAGNOSIS — I255 Ischemic cardiomyopathy: Secondary | ICD-10-CM | POA: Diagnosis not present

## 2016-06-16 DIAGNOSIS — R55 Syncope and collapse: Secondary | ICD-10-CM

## 2016-06-16 DIAGNOSIS — Z955 Presence of coronary angioplasty implant and graft: Secondary | ICD-10-CM | POA: Diagnosis not present

## 2016-06-16 DIAGNOSIS — Z7982 Long term (current) use of aspirin: Secondary | ICD-10-CM | POA: Diagnosis not present

## 2016-06-16 DIAGNOSIS — Z7902 Long term (current) use of antithrombotics/antiplatelets: Secondary | ICD-10-CM | POA: Diagnosis not present

## 2016-06-16 DIAGNOSIS — F4310 Post-traumatic stress disorder, unspecified: Secondary | ICD-10-CM | POA: Diagnosis not present

## 2016-06-16 DIAGNOSIS — J189 Pneumonia, unspecified organism: Secondary | ICD-10-CM | POA: Diagnosis not present

## 2016-06-16 DIAGNOSIS — F411 Generalized anxiety disorder: Secondary | ICD-10-CM | POA: Diagnosis not present

## 2016-06-16 DIAGNOSIS — N183 Chronic kidney disease, stage 3 (moderate): Secondary | ICD-10-CM

## 2016-06-16 DIAGNOSIS — I2510 Atherosclerotic heart disease of native coronary artery without angina pectoris: Secondary | ICD-10-CM | POA: Diagnosis not present

## 2016-06-16 DIAGNOSIS — I25810 Atherosclerosis of coronary artery bypass graft(s) without angina pectoris: Secondary | ICD-10-CM

## 2016-06-16 DIAGNOSIS — I471 Supraventricular tachycardia: Secondary | ICD-10-CM | POA: Diagnosis not present

## 2016-06-16 LAB — BASIC METABOLIC PANEL
Anion gap: 6 (ref 5–15)
BUN: 19 mg/dL (ref 6–20)
CHLORIDE: 107 mmol/L (ref 101–111)
CO2: 28 mmol/L (ref 22–32)
CREATININE: 1.34 mg/dL — AB (ref 0.61–1.24)
Calcium: 8.8 mg/dL — ABNORMAL LOW (ref 8.9–10.3)
GFR calc Af Amer: 60 mL/min — ABNORMAL LOW (ref >60)
GFR calc non Af Amer: 52 mL/min — ABNORMAL LOW (ref >60)
Glucose, Bld: 94 mg/dL (ref 65–99)
Potassium: 4.1 mmol/L (ref 3.5–5.1)
Sodium: 141 mmol/L (ref 135–145)

## 2016-06-16 LAB — CBC
HCT: 40.8 % (ref 39.0–52.0)
Hemoglobin: 13.4 g/dL (ref 13.0–17.0)
MCH: 31.4 pg (ref 26.0–34.0)
MCHC: 32.8 g/dL (ref 30.0–36.0)
MCV: 95.6 fL (ref 78.0–100.0)
PLATELETS: 166 10*3/uL (ref 150–400)
RBC: 4.27 MIL/uL (ref 4.22–5.81)
RDW: 13.7 % (ref 11.5–15.5)
WBC: 4.1 10*3/uL (ref 4.0–10.5)

## 2016-06-16 LAB — ECHOCARDIOGRAM COMPLETE

## 2016-06-16 LAB — ETHANOL: Alcohol, Ethyl (B): <5 mg/dL (ref <5)

## 2016-06-16 NOTE — Discharge Instructions (Signed)
Near-Syncope °Near-syncope is when you suddenly become weak or dizzy, or you feel like you might pass out (faint). During an episode of near-syncope, you may: °· Feel dizzy or light-headed. °· Feel nauseous. °· See all white or all black in your field of vision. °· Have cold, clammy skin. ° °This condition is caused by a sudden decrease in blood flow to the brain. This decrease can result from various causes, but most of those causes are not dangerous. However, near-syncope can be a sign of a serious medical problem, so it is important to seek medical care. °If you fainted, get medical help right away.Call your local emergency services (911 in the [place] Do not drive yourself to the hospital. °Follow these instructions at home: °Pay attention to any changes in your symptoms. Take these actions to help with your condition: °· Have someone stay with you until you feel stable. °· Do not drive, use machinery, or play sports until your health care provider says it is okay. °· Keep all follow-up visits as told by your health care provider. This is important. °· If you start to feel like you might faint, lie down right away and raise (elevate) your feet above the level of your heart. Breathe deeply and steadily. Wait until all of the symptoms have passed. °· Drink enough fluid to keep your urine clear or pale yellow. °· If you are taking blood pressure or heart medicine, get up slowly and take several minutes to sit and then stand. This can reduce dizziness. °· Take over-the-counter and prescription medicines only as told by your health care provider. ° °Get help right away if: °· You have a severe headache. °· You have unusual pain in your chest, abdomen, or back. °· You are bleeding from your mouth or rectum, or you have black or tarry stool. °· You have a very fast or irregular heartbeat (palpitations). °· You faint once or repeatedly. °· You have a seizure. °· You are confused. °· You have trouble walking. °· You have  severe weakness. °· You have vision problems. °These symptoms may represent a serious problem that is an emergency. Do not wait to see if your symptoms will go away. Get medical help right away. Call your local emergency services (911 in the [place] Do not drive yourself to the hospital. °This information is not intended to replace advice given to you by your health care provider. Make sure you discuss any questions you have with your health care provider. °Document Released: 03/28/2005 Document Revised: 09/03/2015 Document Reviewed: 12/10/2014 °Elsevier Interactive Patient Education © 2017 [employer] ° °

## 2016-06-16 NOTE — Progress Notes (Signed)
Trevor Branch discharged Home with family per MD order.  Discharge instructions reviewed and discussed with the patient, all questions and concerns answered. Copy of instructions given to patient, there was no change in medications, so no prescriptions given.  Allergies as of 06/16/2016      Reactions   Other Other (See Comments)   Uncoded Allergy. Allergen: VERSED, Other Reaction: shaking Uncoded Allergy. Allergen: ECG adhesive pads, Other Reaction: skin redness and tearing   Atorvastatin    REACTION: intolerant   Doxycycline    GI upset   Mucinex [guaifenesin Er] Hives   Rosuvastatin    REACTION: intolerant   Verapamil Hcl Er    Imitrex [sumatriptan] Palpitations      Medication List    TAKE these medications   ALPRAZolam 0.5 MG tablet Commonly known as:  XANAX Take 0.5 mg by mouth as needed for anxiety.   aspirin EC 81 MG tablet Take 1 tablet (81 mg total) by mouth daily.   clopidogrel 75 MG tablet Commonly known as:  PLAVIX TAKE ONE TABLET BY MOUTH ONCE DAILY   famotidine 40 MG tablet Commonly known as:  PEPCID Take 1 tablet (40 mg total) by mouth 2 (two) times daily. What changed:  when to take this  reasons to take this   latanoprost 0.005 % ophthalmic solution Commonly known as:  XALATAN Place 1 drop into both eyes daily.   pravastatin 40 MG tablet Commonly known as:  PRAVACHOL TAKE ONE TABLET BY MOUTH ONCE DAILY IN THE EVENING   timolol 0.5 % ophthalmic solution Commonly known as:  TIMOPTIC Place 1 drop into both eyes daily.       Patients skin is clean, dry and intact, no evidence of skin break down. IV site discontinued and catheter remains intact. Site without signs and symptoms of complications. Dressing and pressure applied.  Patient ambulated to car, no distress noted upon discharge.  Trevor Branch, Trevor Branch 06/16/2016 5:47 PM

## 2016-06-16 NOTE — Discharge Summary (Addendum)
Physician Discharge Summary  Trevor Branch UYQ:[id number] DOB: September 27, 1945 DOA: 06/15/2016  PCP: Orpah Melter, MD  Admit date: 06/15/2016 Discharge date: 06/16/2016  Time spent: 45 minutes  Recommendations for Outpatient Follow-up:  Patient will be discharged to home.  Patient will need to follow up with primary care provider within one week of discharge.  Follow up with Dr. Percival Spanish in one week. Patient should continue medications as prescribed.  Patient should follow a heart healthy diet.   Discharge Diagnoses:  Near syncope Chronic kidney disease, stage III Hyperlipidemia CAD Anxiety  Discharge Condition: Stable   Diet recommendation: heart healthy  There were no vitals filed for this visit.  History of present illness:  On 06/15/2016 by Dr. Karmen Bongo Trevor Branch is a 71 y.o. male with medical history significant of SV s/p ablation, PTSD, HLD, CAD with stents in 3/17, and GERD presenting with pre-syncope.  Patient was at the [place] today and he went to a group session.  They also have dental benefits and so he went to check on those benefits.  While he was going, he noticed that he was a bit light-headed.  He started getting woozier and woozier.  The person at the dental desk was describing a problem with his qualifications and so he walked back to the main desk - but felt drunk, light-headed, weak all over, arms and legs heavy, things getting hazy.  He started talking to someone and he was slurring speech and they called 911.  Also diaphoretic.  No h/o similar.  Thought he was going to pass out but he never did.  No specific unilateral symptoms.  Has a headache now, worse than while he was at the [place].  No chest pain.  Hazy vision.  No dysphagia.   He reports that he also did not feel well last night - restless, difficulty sleeping.  Has h/o PTSD and so this is not uncommon.  He is also very stressed.  Saw urology yesterday, BP was high.  Very unusual for him.  BP was 150/90 at [facility]  today,usually 120/70.  Urologist is planning to biopsy the prostate, PSA is elevated at 11.5, previously 4.5 in 1/17, 6.72 in 10/17.  1 year ago he had stents x 3.  No stress test or catheterization since.  Hospital Course:  Near syncope -patient did not have LOC or pass out -He felt light headed and dizzy -EtOH <5 -unknown cause, ?vasovagal vs dehydration -patients BP did respond well to change in position, he did have a drop >10 from sitting to standing, however standing at 3 minutes showed appropriate response -TSH 1.074 (normal) -Echocardiogram EF 55-60%, grade one diastolic dysfunction. Septal motion showed abnormal function and dyssynergy. -No neurological deficits -no tele events overnight -Patient will follow up with cardiologist next week  Chronic kidney disease, stage III -Patient unaware of this -Explained to both patient and wife, since 06/2015, creatinine has been stable, GFR in stage III -Repeat BMP occasionally and discuss with PCP   Hyperlipidemia -Continue statin  CAD -Stable, no complaints of chest pain- -Continue aspirin, plavix, statin -No BB or ACEi as patient's BP has been stable (low normal)  Anxiety -Continue xanax PRN  Procedures: Echocardiogram  Consultations: None  Discharge Exam: Vitals:   06/15/16 2022 06/16/16 0558  BP: 135/71 (!) 100/57  Pulse: 77 64  Resp: 18 17  Temp:  98.6 F (37 C)     General: Well developed, well nourished, NAD, appears stated age  13: NCAT, PERRLA, EOMI,  Anicteic Sclera, mucous membranes moist.  Neck: Supple, no JVD, no masses  Cardiovascular: S1 S2 auscultated, no rubs, murmurs or gallops. Regular rate and rhythm.  Respiratory: Clear to auscultation bilaterally with equal chest rise  Abdomen: Soft, nontender, nondistended, + bowel sounds  Extremities: warm dry without cyanosis clubbing or edema  Neuro: AAOx3, cranial nerves grossly intact. Strength 5/5 in patient's upper and lower extremities  bilaterally  Skin: Without rashes exudates or nodules  Psych: Normal affect and demeanor with intact judgement and insight  Discharge Instructions Discharge Instructions    Discharge instructions    Complete by:  As directed    Patient will be discharged to home.  Patient will need to follow up with primary care provider within one week of discharge.  Follow up with Dr. Percival Spanish in one week. Patient should continue medications as prescribed.  Patient should follow a heart healthy diet.     Current Discharge Medication List    CONTINUE these medications which have NOT CHANGED   Details  ALPRAZolam (XANAX) 0.5 MG tablet Take 0.5 mg by mouth as needed for anxiety.    aspirin EC 81 MG tablet Take 1 tablet (81 mg total) by mouth daily. Qty: 90 tablet, Refills: 3    clopidogrel (PLAVIX) 75 MG tablet TAKE ONE TABLET BY MOUTH ONCE DAILY Qty: 90 tablet, Refills: 2    famotidine (PEPCID) 40 MG tablet Take 1 tablet (40 mg total) by mouth 2 (two) times daily.    latanoprost (XALATAN) 0.005 % ophthalmic solution Place 1 drop into both eyes daily.    pravastatin (PRAVACHOL) 40 MG tablet TAKE ONE TABLET BY MOUTH ONCE DAILY IN THE EVENING Qty: 90 tablet, Refills: 2    timolol (TIMOPTIC) 0.5 % ophthalmic solution Place 1 drop into both eyes daily.       Allergies  Allergen Reactions  . Other Other (See Comments)    Uncoded Allergy. Allergen: VERSED, Other Reaction: shaking Uncoded Allergy. Allergen: ECG adhesive pads, Other Reaction: skin redness and tearing  . Atorvastatin     REACTION: intolerant  . Doxycycline     GI upset  . Mucinex [Guaifenesin Er] Hives  . Rosuvastatin     REACTION: intolerant  . Verapamil Hcl Er   . Imitrex [Sumatriptan] Palpitations   Follow-up Information    MEYERS, STEPHEN, MD. Schedule an appointment as soon as possible for a visit in 1 week(s).   Specialty:  Family Medicine Why:  Hospital follow up Contact information: [address] [place] [address] [phone]        Minus Breeding, [place] to.   Specialty:  Cardiology Why:  Scheduled appointment Contact information: [address] [place] [address] [phone]            The results of significant diagnostics from this hospitalization (including imaging, microbiology, ancillary and laboratory) are listed below for reference.    Significant Diagnostic Studies: Dg Chest 2 View  Result Date: 06/16/2016 CLINICAL DATA:  Pneumonia. EXAM: CHEST  2 VIEW COMPARISON:  Chest radiograph from one day prior. FINDINGS: Coronary stent overlies the left heart. Stable cardiomediastinal silhouette with normal heart size and aortic atherosclerosis. No pneumothorax. No pleural effusion. Previously described lung base opacity on the lateral view on the chest radiograph from 1 day prior has nearly resolved, compatible with improving atelectasis or inflammatory opacity . No new consolidative airspace disease. No pulmonary edema. IMPRESSION: Previously described small lung base opacity on the lateral view on the  chest radiograph from 1 day prior has nearly resolved, compatible with improving atelectasis or inflammatory opacity. Otherwise no active disease in the chest. Aortic atherosclerosis. Electronically Signed   By: Ilona Sorrel M.D.   On: 06/16/2016 08:41   Dg Chest 2 View  Result Date: 06/15/2016 CLINICAL DATA:  Near syncope, fatigue, weakness EXAM: CHEST  2 VIEW COMPARISON:  CT chest of 07/09/2015 and chest x-ray of 06/20/2015 FINDINGS: No active infiltrate or effusion is seen. On the lateral view there is and opacity is just above the hemidiaphragm overlying the heart shadow, not seen previously. This may represent a focus of scarring or possibly and area of pneumonia in attention this area on followup chest x-ray is recommended. No pleural effusion is seen. Mediastinal and hilar contours are unremarkable. The heart is within normal limits in size. No acute bony  abnormality is seen. IMPRESSION: 1. Vague opacity at the lung base on the lateral view overlying the heart shadow not seen previously. Possible scarring or focus of pneumonia. Recommend attention to this area on followup chest x-ray. 2. The heart is within normal limits in size. Electronically Signed   By: Ivar Drape M.D.   On: 06/15/2016 17:05   Ct Head Wo Contrast  Result Date: 06/15/2016 CLINICAL DATA:  syncope today while walking, altered mental status EXAM: CT HEAD WITHOUT CONTRAST TECHNIQUE: Contiguous axial images were obtained from the base of the skull through the vertex without intravenous contrast. COMPARISON:  None. FINDINGS: Brain: No intracranial hemorrhage, mass effect or midline shift. No acute cortical infarction. No mass lesion is noted on this unenhanced scan. The gray and white-matter differentiation is preserved. Vascular: No hyperdense vessel or unexpected calcification. Skull: No skull fracture is noted.  No destructive bony lesion. Sinuses/Orbits: There is probable mucous retention cyst medial wall of the right maxillary sinus measures 9 mm. No paranasal sinuses air-fluid levels. Mastoid air cells are unremarkable. Other: None IMPRESSION: No acute intracranial abnormality. No definite acute cortical infarction. There probable mucous retention cyst medial aspect of the right maxillary sinus measures 9 mm. Electronically Signed   By: Lahoma Crocker M.D.   On: 06/15/2016 17:12    Microbiology: No results found for this or any previous visit (from the past 240 hour(s)).   Labs: Basic Metabolic Panel:  Recent Labs Lab 06/15/16 1730 06/16/16 0719  NA 141 141  K 4.2 4.1  CL 106 107  CO2 29 28  GLUCOSE 93 94  BUN 19 19  CREATININE 1.27* 1.34*  CALCIUM 9.2 8.8*   Liver Function Tests:  Recent Labs Lab 06/15/16 1730  AST 20  ALT 16*  ALKPHOS 64  BILITOT 0.7  PROT 6.8  ALBUMIN 4.0   No results for input(s): LIPASE, AMYLASE in the last 168 hours. No results for  input(s): AMMONIA in the last 168 hours. CBC:  Recent Labs Lab 06/15/16 1730 06/16/16 0719  WBC 8.8 4.1  NEUTROABS 6.6  --   HGB 14.1 13.4  HCT 42.0 40.8  MCV 93.8 95.6  PLT 176 166   Cardiac Enzymes: No results for input(s): CKTOTAL, CKMB, CKMBINDEX, TROPONINI in the last 168 hours. BNP: BNP (last 3 results)  Recent Labs  06/15/16 1730  BNP 19.6    ProBNP (last 3 results) No results for input(s): PROBNP in the last 8760 hours.  CBG: No results for input(s): GLUCAP in the last 168 hours.     SignedCristal Ford  [facility] 06/16/2016, 1:28 PM

## 2016-06-28 NOTE — Progress Notes (Signed)
Cardiology Office Note   Date:  06/30/2016   ID:  Trevor Branch, DOB January 04, 1946, MRN [id number]  PCP:  Orpah Melter, MD  Cardiologist:   Minus Breeding, MD   Chief Complaint  Patient presents with  . Coronary Artery Disease     History of Present Illness: Trevor Branch is a 71 y.o. male who presents for follow up of CAD.  He had  NSTEM last year. LHC demonstrated OM1 80%, mid LAD 99%. He underwent PCI with DES x 2 to the mLAD. Echo confirmed ischemic CM with EF 35-40% with ant-septal, inf-septal and apical wall motion abnormalities. He returned to the Cath Lab 3/15 and underwent staged PCI of the OM1 with a Synergy DES. LVEDP 17-19 mmHg. No beta-blocker was started due to bradycardia. He was not put on ACE inhibitor due to low BP. He is allergic to statins and was put on Zetia. 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  He had a follow up echo in May that demonstrated a low normal EF without mention of effusion.  He returns for follow up. He was admitted to the hospital earlier this week secondary to near syncope.   There was no clear etiology and I reviewed these hospital records for this visit.  Echo demonstrated no acute findings.       He said that when he was in the hospital right before that he felt his fingers tingling. He was at a [place] appointment and the semicircular his voice. Head CT demonstrated no acute findings. He hasn't had those same symptoms since then he's been feeling anxious. He has some epigastric fullness but he thinks this is anxiety. He's not describing any of the chest discomfort that he had at the time of his heart attack. He is anxious because he needs to have biopsy of his prostate. He is self cathing procedure is in the bladder. He's not having any new shortness of breath, PND or orthopnea. He's had no new palpitations, presyncope or syncope. He moved into a new place and is not having contact  with people and not getting out doing things because her neighbors.  His anxiety and depression is increased.   Past Medical History:  Diagnosis Date  . CAD (coronary artery disease)    a. NSTEMI 3/17 - s/p DES x 2 to mid LAD; staged PCI with DES to OM1; residual CAD - mLCX 40%, pRCA 30%, mRCA 20%   . GERD (gastroesophageal reflux disease)   . Glaucoma   . History of non-ST elevation myocardial infarction (NSTEMI) 06/2015  . Hyperlipidemia   . Ischemic cardiomyopathy    a. Echo 06/23/15 - EF 35-40%, ant-septal, inferoseptal, apical AK, Gr 1 DD, trivial AI >>  b. Echo 5/17 - EF 55-60%, no RWMA, Gr 1 DD  . PTSD (post-traumatic stress disorder)    panic attacks  . SVT (supraventricular tachycardia) (HCC)    s/p prior RFCA    Past Surgical History:  Procedure Laterality Date  . CARDIAC CATHETERIZATION [name] 06/22/2015   Procedure: Left Heart Cath and Coronary Angiography;  Surgeon: Jettie Booze, MD;  Location: [facility];  Service: Cardiovascular;  Laterality: [name];  . CARDIAC CATHETERIZATION [name] 06/22/2015   Procedure: Coronary Stent Intervention;  Surgeon: Jettie Booze, MD;  Location: [facility];  Service: Cardiovascular;  Laterality: [name];  . CARDIAC CATHETERIZATION [name] 06/24/2015   Procedure: Left Heart Cath and Coronary  Angiography;  Surgeon: Troy Sine, MD;  Location: [facility];  Service: Cardiovascular;  Laterality: [name];  . CARDIAC CATHETERIZATION [name] 06/24/2015   Procedure: Coronary Stent Intervention;  Surgeon: Troy Sine, MD;  Location: [facility];  Service: Cardiovascular;  Laterality: [name];  . SUPRAVENTRICULAR TACHYCARDIA ABLATION  2009   at [facility]     Current Outpatient Prescriptions  Medication Sig Dispense Refill  . ALPRAZolam (XANAX) 0.5 MG tablet Take 0.5 mg by mouth as needed for anxiety.    Marland Kitchen aspirin EC 81 MG tablet Take 1 tablet (81 mg total) by mouth daily. 90 tablet 3  . famotidine (PEPCID) 40 MG tablet Take 40 mg by  mouth 2 (two) times daily.    Marland Kitchen latanoprost (XALATAN) 0.005 % ophthalmic solution Place 1 drop into both eyes daily.    . pravastatin (PRAVACHOL) 80 MG tablet Take 1 tablet (80 mg total) by mouth daily. 90 tablet 3  . timolol (TIMOPTIC) 0.5 % ophthalmic solution Place 1 drop into both eyes daily.     No current facility-administered medications for this visit.     Allergies:   Other; Atorvastatin; Doxycycline; Mucinex [guaifenesin er]; Rosuvastatin; Verapamil hcl er; and Imitrex [sumatriptan]    ROS:  Please see the history of present illness.   Otherwise, review of systems are positive for anxiety.   All other systems are reviewed and negative.    PHYSICAL EXAM: VS:  BP 132/70   Pulse 64   Ht 6\' 1"  (1.854 m)   Wt 195 lb (88.5 kg)   BMI 25.73 kg/m  , BMI Body mass index is 25.73 kg/m. GENERAL:  Well appearing HEENT:  Pupils equal round and reactive, fundi not visualized.   NECK:  No jugular venous distention, waveform within normal limits, carotid upstroke brisk and symmetric, no bruits, no thyromegaly LYMPHATICS:  No cervical, inguinal adenopathy LUNGS:  Clear to auscultation bilaterally BACK:  No CVA tenderness CHEST:  Unremarkable HEART:  PMI not displaced or sustained,S1 and S2 within normal limits, no S3, no S4, no clicks, no rubs, no  murmurs ABD:  Flat, positive bowel sounds normal in frequency in pitch, no bruits, no rebound, no guarding, no midline pulsatile mass, no hepatomegaly, no splenomegaly EXT:  2 plus pulses throughout, no edema, no cyanosis no clubbing    EKG:  EKG is not   ordered today.   Recent Labs: 06/15/2016: ALT 16; B Natriuretic Peptide 19.6; TSH 1.074 06/16/2016: BUN 19; Creatinine, Ser 1.34; Hemoglobin 13.4; Platelets 166; Potassium 4.1; Sodium 141    Lipid Panel    Component Value Date/Time   CHOL 186 12/31/2015 0809   TRIG 133 12/31/2015 0809   HDL 53 12/31/2015 0809   CHOLHDL 3.5 12/31/2015 0809   CHOLHDL 4.4 06/20/2015 0658   VLDL 14  06/20/2015 0658   LDLCALC 106 (H) 12/31/2015 0809   LDLDIRECT 173.8 10/03/2007 0721      Wt Readings from Last 3 Encounters:  06/29/16 195 lb (88.5 kg)  12/30/15 186 lb (84.4 kg)  10/07/15 191 lb (86.6 kg)      Other studies Reviewed: Additional studies/ records that were reviewed today include: Hospital admission. . Review of the above records demonstrates:    See above.   ASSESSMENT AND PLAN:  Near Syncope:    The etiology of this is not clear but is not having any further episodes. No further workup is indicated.  CAD -    The patient has no new sypmtoms.  No  further cardiovascular testing is indicated.  We will continue with aggressive risk reduction.  He can stop his Plavix as it has been one year.  However, I don't want him to stop his aspirin. He's supposed to have a biopsy of his prostate but I think would be high risk to stop both antiplatelets and he's going to discuss this with his urologist.  CKD Stage II:    Creat was stable at 1.34.    Ischemic CM -    His ejection fraction was OK on recent echo. He will continue the meds as listed.  HL -    He is tolerating pravastatin .  He is not quite at target and I would like for him to increase his pravastatin 80 mg daily and get a lipid profile in 8 weeks.  PTSD -    We had a discussion about this and he needs to increase his exercise. He is followed at the [place] but hasn't tolerated medications. He's going to rediscuss this with him.  Current medicines are reviewed at length with the patient today.  The patient does not have concerns regarding medicines.  The following changes have been made:  None  Labs/ tests ordered today include:   None  Orders Placed This Encounter  Procedures  . Lipid panel     Disposition:   FU with me in six months.    Signed, Minus Breeding, MD  06/30/2016 1:44 PM    [facility]

## 2016-06-29 ENCOUNTER — Ambulatory Visit (INDEPENDENT_AMBULATORY_CARE_PROVIDER_SITE_OTHER): Payer: PPO | Admitting: Cardiology

## 2016-06-29 ENCOUNTER — Encounter: Payer: Self-pay | Admitting: Cardiology

## 2016-06-29 VITALS — BP 132/70 | HR 64 | Ht 73.0 in | Wt 195.0 lb

## 2016-06-29 DIAGNOSIS — N182 Chronic kidney disease, stage 2 (mild): Secondary | ICD-10-CM | POA: Diagnosis not present

## 2016-06-29 DIAGNOSIS — Z79899 Other long term (current) drug therapy: Secondary | ICD-10-CM | POA: Diagnosis not present

## 2016-06-29 DIAGNOSIS — E7800 Pure hypercholesterolemia, unspecified: Secondary | ICD-10-CM

## 2016-06-29 DIAGNOSIS — R42 Dizziness and giddiness: Secondary | ICD-10-CM | POA: Diagnosis not present

## 2016-06-29 DIAGNOSIS — I2510 Atherosclerotic heart disease of native coronary artery without angina pectoris: Secondary | ICD-10-CM

## 2016-06-29 MED ORDER — PRAVASTATIN SODIUM 80 MG PO TABS: 80.0000 mg | ORAL_TABLET | Freq: Every day | ORAL | 3 refills | Status: DC

## 2016-06-29 NOTE — Patient Instructions (Addendum)
Medication Instructions:  Please stop your Plavix Double your Pravastatin to 80 mg a day. Continue all other medications as listed.  Labwork: Please have fasting blood work in 8 weeks. (Lipid panel)  You will be due for this blood work around 08/24/2016 and may have it drawn at your primary care doctor's office.  Follow-Up: Follow up in 6 months with Dr. Percival Spanish.  You will receive a letter in the mail 2 months before you are due.  Please call us when you receive this letter to schedule your follow up appointment.  If you need a refill on your cardiac medications before your next appointment, please call your pharmacy.  Thank you for choosing [facility]!!

## 2016-06-30 ENCOUNTER — Encounter: Payer: Self-pay | Admitting: Cardiology

## 2016-07-05 DIAGNOSIS — R972 Elevated prostate specific antigen [PSA]: Secondary | ICD-10-CM | POA: Diagnosis not present

## 2016-07-21 ENCOUNTER — Other Ambulatory Visit: Payer: Self-pay | Admitting: Urology

## 2016-07-21 DIAGNOSIS — R350 Frequency of micturition: Secondary | ICD-10-CM | POA: Diagnosis not present

## 2016-07-21 DIAGNOSIS — N401 Enlarged prostate with lower urinary tract symptoms: Secondary | ICD-10-CM | POA: Diagnosis not present

## 2016-07-21 DIAGNOSIS — C61 Malignant neoplasm of prostate: Secondary | ICD-10-CM | POA: Diagnosis not present

## 2016-07-21 DIAGNOSIS — N281 Cyst of kidney, acquired: Secondary | ICD-10-CM | POA: Diagnosis not present

## 2016-07-28 ENCOUNTER — Telehealth: Payer: Self-pay | Admitting: *Deleted

## 2016-07-29 ENCOUNTER — Other Ambulatory Visit (HOSPITAL_COMMUNITY): Payer: PPO

## 2016-07-29 ENCOUNTER — Encounter (HOSPITAL_COMMUNITY): Payer: PPO

## 2016-07-29 ENCOUNTER — Encounter (HOSPITAL_COMMUNITY): Payer: Self-pay

## 2016-07-29 DIAGNOSIS — K5730 Diverticulosis of large intestine without perforation or abscess without bleeding: Secondary | ICD-10-CM | POA: Diagnosis not present

## 2016-07-29 DIAGNOSIS — C61 Malignant neoplasm of prostate: Secondary | ICD-10-CM | POA: Diagnosis not present

## 2016-08-01 ENCOUNTER — Encounter (HOSPITAL_COMMUNITY)
Admission: RE | Admit: 2016-08-01 | Discharge: 2016-08-01 | Disposition: A | Discharge status: Home / Self Care | Payer: PPO | Source: Ambulatory Visit | Attending: Urology | Admitting: Urology

## 2016-08-01 DIAGNOSIS — C61 Malignant neoplasm of prostate: Secondary | ICD-10-CM

## 2016-08-01 MED ORDER — TECHNETIUM TC 99M MEDRONATE IV KIT
21.7000 | PACK | Freq: Once | INTRAVENOUS | Status: AC | PRN
  Administered 2016-08-01: 21.7 via INTRAVENOUS

## 2016-08-16 ENCOUNTER — Other Ambulatory Visit: Payer: Self-pay | Admitting: Urology

## 2016-08-16 DIAGNOSIS — N401 Enlarged prostate with lower urinary tract symptoms: Secondary | ICD-10-CM | POA: Diagnosis not present

## 2016-08-16 DIAGNOSIS — N323 Diverticulum of bladder: Secondary | ICD-10-CM | POA: Diagnosis not present

## 2016-08-16 DIAGNOSIS — R350 Frequency of micturition: Secondary | ICD-10-CM | POA: Diagnosis not present

## 2016-08-16 DIAGNOSIS — R972 Elevated prostate specific antigen [PSA]: Secondary | ICD-10-CM | POA: Diagnosis not present

## 2016-08-16 DIAGNOSIS — C61 Malignant neoplasm of prostate: Secondary | ICD-10-CM | POA: Diagnosis not present

## 2016-08-18 DIAGNOSIS — R1032 Left lower quadrant pain: Secondary | ICD-10-CM | POA: Diagnosis not present

## 2016-08-18 DIAGNOSIS — N393 Stress incontinence (female) (male): Secondary | ICD-10-CM | POA: Diagnosis not present

## 2016-08-18 DIAGNOSIS — M6281 Muscle weakness (generalized): Secondary | ICD-10-CM | POA: Diagnosis not present

## 2016-08-18 DIAGNOSIS — C61 Malignant neoplasm of prostate: Secondary | ICD-10-CM | POA: Diagnosis not present

## 2016-08-25 NOTE — Progress Notes (Addendum)
LOV [facility] CARDIOLOGY 06-29-16 EPIC EKG 06-16-16 EPIC CHEST XRAY 06-16-16 EPIC LEFT HEART CATH 06-24-15 EPIC ECHO 06-16-16 EPIC

## 2016-08-25 NOTE — Patient Instructions (Addendum)
Trevor Branch  08/25/2016   Your procedure is scheduled on: 08-31-16  Report to [place]  elevators to 3rd floor to  [place] at 1000 AM.  Call this number if you have problems the morning of surgery [phone]   Remember: ONLY 1 PERSON MAY GO WITH YOU TO SHORT STAY TO GET  READY MORNING OF [place].  Do not eat food  :After Midnight Monday night clear liquids all day Tuesday 08-30-16 per dr Tresa Moore follow all bowel prep instrutions from dr Tresa Moore. No clear liquids after midnight Tuesday night. Magnesium citrate - one bottle drink by 12 noon day before surgery.       Take these medicines the morning of surgery with A SIP OF WATER: ALPRAZOLAM (XANAX ) IF NEEDED, RESTASIS EYE DROP, FAMOTIDINE (PEPCID), REFESH EYE DROP, TIMOLOL EYE DROP,                                You may not have any metal on your body including hair pins and              piercings  Do not wear jewelry,  lotions, powders or perfumes, deodorant                          Men may shave face and neck.   Do not bring valuables to the hospital. [place].  Contacts, dentures or bridgework may not be worn into surgery.  Leave suitcase in the car. After surgery it may be brought to your room.                 Please read over the following fact sheets you were given: _____________________________________________________________________                CLEAR LIQUID DIET   Foods Allowed                                                                     Foods Excluded  Coffee and tea, regular and decaf                             liquids that you cannot  Plain Jell-O in any flavor                                             see through such as: Fruit ices (not with fruit pulp)                                     milk, soups, orange juice  Iced Popsicles  All solid  food Carbonated beverages, regular and diet                                    Cranberry, grape and apple juices Sports drinks like Gatorade Lightly seasoned clear broth or consume(fat free) Sugar, honey syrup  Sample Menu Breakfast                                Lunch                                     Supper Cranberry juice                    Beef broth                            Chicken broth Jell-O                                     Grape juice                           Apple juice Coffee or tea                        Jell-O                                      Popsicle                                                Coffee or tea                        Coffee or tea  _____________________________________________________________________  [facility] - Preparing for Surgery Before surgery, you can play an important role.  Because skin is not sterile, your skin needs to be as free of germs as possible.  You can reduce the number of germs on your skin by washing with CHG (chlorahexidine gluconate) soap before surgery.  CHG is an antiseptic cleaner which kills germs and bonds with the skin to continue killing germs even after washing. Please DO NOT use if you have an allergy to CHG or antibacterial soaps.  If your skin becomes reddened/irritated stop using the CHG and inform your nurse when you arrive at Short Stay. Do not shave (including legs and underarms) for at least 48 hours prior to the first CHG shower.  You may shave your face/neck. Please follow these instructions carefully:  1.  Shower with CHG Soap the night before surgery and the  morning of Surgery.  2.  If you choose to wash your hair, wash your hair first as usual with your  normal  shampoo.  3.  After you shampoo, rinse your hair and body thoroughly to remove the  shampoo.  4.  Use CHG as you would any other liquid soap.  You can apply chg directly  to the skin and wash                       Gently  with a scrungie or clean washcloth.  5.  Apply the CHG Soap to your body ONLY FROM THE NECK DOWN.   Do not use on face/ open                           Wound or open sores. Avoid contact with eyes, ears mouth and genitals (private parts).                       Wash face,  Genitals (private parts) with your normal soap.             6.  Wash thoroughly, paying special attention to the area where your surgery  will be performed.  7.  Thoroughly rinse your body with warm water from the neck down.  8.  DO NOT shower/wash with your normal soap after using and rinsing off  the CHG Soap.                9.  Pat yourself dry with a clean towel.            10.  Wear clean pajamas.            11.  Place clean sheets on your bed the night of your first shower and do not  sleep with pets. Day of Surgery : Do not apply any lotions/deodorants the morning of surgery.  Please wear clean clothes to the hospital/surgery center.  FAILURE TO FOLLOW THESE INSTRUCTIONS MAY RESULT IN THE CANCELLATION OF YOUR SURGERY PATIENT SIGNATURE_________________________________  NURSE SIGNATURE__________________________________  ________________________________________________________________________  WHAT IS A BLOOD TRANSFUSION? Blood Transfusion Information  A transfusion is the replacement of blood or some of its parts. Blood is made up of multiple cells which provide different functions.  Red blood cells carry oxygen and are used for blood loss replacement.  White blood cells fight against infection.  Platelets control bleeding.  Plasma helps clot blood.  Other blood products are available for specialized needs, such as hemophilia or other clotting disorders. BEFORE THE TRANSFUSION  Who gives blood for transfusions?   Healthy volunteers who are fully evaluated to make sure their blood is safe. This is blood bank blood. Transfusion therapy is the safest it has ever been in the practice of medicine. Before blood is  taken from a donor, a complete history is taken to make sure that person has no history of diseases nor engages in risky social behavior (examples are intravenous drug use or sexual activity with multiple partners). The donor's travel history is screened to minimize risk of transmitting infections, such as malaria. The donated blood is tested for signs of infectious diseases, such as HIV and hepatitis. The blood is then tested to be sure it is compatible with you in order to minimize the chance of a transfusion reaction. If you or a relative donates blood, this is often done in anticipation of surgery and is not appropriate for emergency situations. It takes many days to process the donated blood. RISKS AND COMPLICATIONS Although transfusion therapy is very safe and saves many lives, the main dangers of transfusion include:   Getting an infectious disease.  Developing a transfusion reaction.  This is an allergic reaction to something in the blood you were given. Every precaution is taken to prevent this. The decision to have a blood transfusion has been considered carefully by your caregiver before blood is given. Blood is not given unless the benefits outweigh the risks. AFTER THE TRANSFUSION  Right after receiving a blood transfusion, you will usually feel much better and more energetic. This is especially true if your red blood cells have gotten low (anemic). The transfusion raises the level of the red blood cells which carry oxygen, and this usually causes an energy increase.  The nurse administering the transfusion will monitor you carefully for complications. HOME CARE INSTRUCTIONS  No special instructions are needed after a transfusion. You may find your energy is better. Speak with your caregiver about any limitations on activity for underlying diseases you may have. SEEK MEDICAL CARE IF:   Your condition is not improving after your transfusion.  You develop redness or irritation at the  intravenous (IV) site. SEEK IMMEDIATE MEDICAL CARE IF:  Any of the following symptoms occur over the next 12 hours:  Shaking chills.  You have a temperature by mouth above 102 F (38.9 C), not controlled by medicine.  Chest, back, or muscle pain.  People around you feel you are not acting correctly or are confused.  Shortness of breath or difficulty breathing.  Dizziness and fainting.  You get a rash or develop hives.  You have a decrease in urine output.  Your urine turns a dark color or changes to pink, red, or brown. Any of the following symptoms occur over the next 10 days:  You have a temperature by mouth above 102 F (38.9 C), not controlled by medicine.  Shortness of breath.  Weakness after normal activity.  The white part of the eye turns yellow (jaundice).  You have a decrease in the amount of urine or are urinating less often.  Your urine turns a dark color or changes to pink, red, or brown. Document Released: 03/25/2000 Document Revised: 06/20/2011 Document Reviewed: 11/12/2007 [facility] Patient Information 2014 [place], [place].  _______________________________________________________________________

## 2016-08-26 ENCOUNTER — Encounter (HOSPITAL_COMMUNITY): Payer: Self-pay

## 2016-08-26 ENCOUNTER — Encounter (HOSPITAL_COMMUNITY)
Admission: RE | Admit: 2016-08-26 | Discharge: 2016-08-26 | Disposition: A | Discharge status: Home / Self Care | Payer: PPO | Source: Ambulatory Visit | Attending: Urology | Admitting: Urology

## 2016-08-26 DIAGNOSIS — M62838 Other muscle spasm: Secondary | ICD-10-CM | POA: Diagnosis not present

## 2016-08-26 DIAGNOSIS — Z01812 Encounter for preprocedural laboratory examination: Secondary | ICD-10-CM | POA: Insufficient documentation

## 2016-08-26 DIAGNOSIS — C61 Malignant neoplasm of prostate: Secondary | ICD-10-CM | POA: Diagnosis not present

## 2016-08-26 DIAGNOSIS — M6281 Muscle weakness (generalized): Secondary | ICD-10-CM | POA: Diagnosis not present

## 2016-08-26 HISTORY — DX: Adverse effect of unspecified anesthetic, initial encounter: T41.45XA

## 2016-08-26 HISTORY — DX: Depression, unspecified: F32.A

## 2016-08-26 HISTORY — DX: Major depressive disorder, single episode, unspecified: F32.9

## 2016-08-26 HISTORY — DX: Anxiety disorder, unspecified: F41.9

## 2016-08-26 HISTORY — DX: Personal history of urinary calculi: Z87.442

## 2016-08-26 HISTORY — DX: Unspecified osteoarthritis, unspecified site: M19.90

## 2016-08-26 HISTORY — DX: Headache, unspecified: R51.9

## 2016-08-26 HISTORY — DX: Headache: R51

## 2016-08-26 HISTORY — DX: Panic disorder (episodic paroxysmal anxiety): F41.0

## 2016-08-26 HISTORY — DX: Malignant (primary) neoplasm, unspecified: C80.1

## 2016-08-26 HISTORY — DX: Other complications of anesthesia, initial encounter: T88.59XA

## 2016-08-26 HISTORY — DX: Acute myocardial infarction, unspecified: I21.9

## 2016-08-26 LAB — BASIC METABOLIC PANEL
ANION GAP: 5 (ref 5–15)
BUN: 23 mg/dL — ABNORMAL HIGH (ref 6–20)
CALCIUM: 8.9 mg/dL (ref 8.9–10.3)
CO2: 27 mmol/L (ref 22–32)
Chloride: 104 mmol/L (ref 101–111)
Creatinine, Ser: 1.25 mg/dL — ABNORMAL HIGH (ref 0.61–1.24)
GFR, EST NON AFRICAN AMERICAN: 56 mL/min — AB (ref >60)
GLUCOSE: 87 mg/dL (ref 65–99)
POTASSIUM: 4.2 mmol/L (ref 3.5–5.1)
SODIUM: 136 mmol/L (ref 135–145)

## 2016-08-26 LAB — CBC
HEMATOCRIT: 39.9 % (ref 39.0–52.0)
HEMOGLOBIN: 13.4 g/dL (ref 13.0–17.0)
MCH: 31.9 pg (ref 26.0–34.0)
MCHC: 33.6 g/dL (ref 30.0–36.0)
MCV: 95 fL (ref 78.0–100.0)
Platelets: 186 10*3/uL (ref 150–400)
RBC: 4.2 MIL/uL — AB (ref 4.22–5.81)
RDW: 13 % (ref 11.5–15.5)
WBC: 4.7 10*3/uL (ref 4.0–10.5)

## 2016-08-26 NOTE — Progress Notes (Signed)
BMP done 08/26/16 faxed via epic to Dr Tresa Moore.

## 2016-08-26 NOTE — Progress Notes (Signed)
EKG- 06/16/16-epic  LOV- Dr Percival Spanish- 06/29/16-epic and on chart  06/23/15- [place]  06/24/15- heart cath  CXR- 06/16/16-epic

## 2016-08-26 NOTE — Progress Notes (Signed)
Dr Gifford Shave called and came to see patient  At preop appointment regarding medical history and previous complications with anesthesia.

## 2016-08-27 LAB — ABO/RH: ABO/RH(D): O POS

## 2016-08-31 ENCOUNTER — Ambulatory Visit (HOSPITAL_COMMUNITY): Payer: PPO | Admitting: Anesthesiology

## 2016-08-31 ENCOUNTER — Observation Stay (HOSPITAL_COMMUNITY)
Admission: RE | Admit: 2016-08-31 | Discharge: 2016-09-02 | Disposition: A | Discharge status: Home / Self Care | Payer: PPO | Source: Ambulatory Visit | Attending: Urology | Admitting: Urology

## 2016-08-31 ENCOUNTER — Encounter (HOSPITAL_COMMUNITY): Payer: Self-pay | Admitting: *Deleted

## 2016-08-31 ENCOUNTER — Encounter (HOSPITAL_COMMUNITY)
Admission: RE | Disposition: A | Discharge status: Home / Self Care | Payer: Self-pay | Source: Ambulatory Visit | Attending: Urology

## 2016-08-31 DIAGNOSIS — F419 Anxiety disorder, unspecified: Secondary | ICD-10-CM | POA: Diagnosis not present

## 2016-08-31 DIAGNOSIS — Z7982 Long term (current) use of aspirin: Secondary | ICD-10-CM | POA: Insufficient documentation

## 2016-08-31 DIAGNOSIS — K219 Gastro-esophageal reflux disease without esophagitis: Secondary | ICD-10-CM | POA: Diagnosis not present

## 2016-08-31 DIAGNOSIS — Z881 Allergy status to other antibiotic agents status: Secondary | ICD-10-CM | POA: Diagnosis not present

## 2016-08-31 DIAGNOSIS — I2510 Atherosclerotic heart disease of native coronary artery without angina pectoris: Secondary | ICD-10-CM | POA: Insufficient documentation

## 2016-08-31 DIAGNOSIS — Z955 Presence of coronary angioplasty implant and graft: Secondary | ICD-10-CM | POA: Diagnosis not present

## 2016-08-31 DIAGNOSIS — Z8249 Family history of ischemic heart disease and other diseases of the circulatory system: Secondary | ICD-10-CM | POA: Insufficient documentation

## 2016-08-31 DIAGNOSIS — Z85828 Personal history of other malignant neoplasm of skin: Secondary | ICD-10-CM | POA: Insufficient documentation

## 2016-08-31 DIAGNOSIS — C61 Malignant neoplasm of prostate: Principal | ICD-10-CM | POA: Diagnosis present

## 2016-08-31 DIAGNOSIS — I252 Old myocardial infarction: Secondary | ICD-10-CM | POA: Insufficient documentation

## 2016-08-31 DIAGNOSIS — H409 Unspecified glaucoma: Secondary | ICD-10-CM | POA: Insufficient documentation

## 2016-08-31 DIAGNOSIS — Z888 Allergy status to other drugs, medicaments and biological substances status: Secondary | ICD-10-CM | POA: Diagnosis not present

## 2016-08-31 DIAGNOSIS — K5730 Diverticulosis of large intestine without perforation or abscess without bleeding: Secondary | ICD-10-CM | POA: Diagnosis not present

## 2016-08-31 DIAGNOSIS — Z91048 Other nonmedicinal substance allergy status: Secondary | ICD-10-CM | POA: Diagnosis not present

## 2016-08-31 DIAGNOSIS — E785 Hyperlipidemia, unspecified: Secondary | ICD-10-CM | POA: Insufficient documentation

## 2016-08-31 DIAGNOSIS — I255 Ischemic cardiomyopathy: Secondary | ICD-10-CM | POA: Insufficient documentation

## 2016-08-31 DIAGNOSIS — Z79899 Other long term (current) drug therapy: Secondary | ICD-10-CM | POA: Diagnosis not present

## 2016-08-31 HISTORY — PX: ROBOT ASSISTED LAPAROSCOPIC RADICAL PROSTATECTOMY: SHX5141

## 2016-08-31 HISTORY — PX: LYMPHADENECTOMY: SHX5960

## 2016-08-31 LAB — TYPE AND SCREEN
ABO/RH(D): O POS
ANTIBODY SCREEN: NEGATIVE

## 2016-08-31 LAB — HEMOGLOBIN AND HEMATOCRIT, BLOOD
HEMATOCRIT: 37 % — AB (ref 39.0–52.0)
HEMOGLOBIN: 12.3 g/dL — AB (ref 13.0–17.0)

## 2016-08-31 SURGERY — PROSTATECTOMY, RADICAL, ROBOT-ASSISTED, LAPAROSCOPIC
Anesthesia: General

## 2016-08-31 MED ORDER — OXYBUTYNIN CHLORIDE 5 MG PO TABS
ORAL_TABLET | ORAL | Status: AC
  Filled 2016-08-31: qty 1

## 2016-08-31 MED ORDER — SODIUM CHLORIDE 0.9 % IJ SOLN
INTRAMUSCULAR | Status: AC
  Filled 2016-08-31: qty 20

## 2016-08-31 MED ORDER — DEXAMETHASONE SODIUM PHOSPHATE 10 MG/ML IJ SOLN
INTRAMUSCULAR | Status: DC | PRN
  Administered 2016-08-31: 10 mg via INTRAVENOUS

## 2016-08-31 MED ORDER — LIDOCAINE 2% (20 MG/ML) 5 ML SYRINGE
INTRAMUSCULAR | Status: DC | PRN
  Administered 2016-08-31: 100 mg via INTRAVENOUS

## 2016-08-31 MED ORDER — PROPOFOL 10 MG/ML IV BOLUS
INTRAVENOUS | Status: AC
  Filled 2016-08-31: qty 20

## 2016-08-31 MED ORDER — CYCLOSPORINE 0.05 % OP EMUL
1.0000 [drp] | Freq: Two times a day (BID) | OPHTHALMIC | Status: DC
  Administered 2016-08-31 – 2016-09-01 (×3): 1 [drp] via OPHTHALMIC
  Filled 2016-08-31 (×4): qty 1

## 2016-08-31 MED ORDER — CEFAZOLIN SODIUM-DEXTROSE 2-4 GM/100ML-% IV SOLN
2.0000 g | INTRAVENOUS | Status: AC
  Administered 2016-08-31: 2 g via INTRAVENOUS
  Filled 2016-08-31: qty 100

## 2016-08-31 MED ORDER — SUCCINYLCHOLINE CHLORIDE 200 MG/10ML IV SOSY
PREFILLED_SYRINGE | INTRAVENOUS | Status: AC
  Filled 2016-08-31: qty 10

## 2016-08-31 MED ORDER — ROCURONIUM BROMIDE 50 MG/5ML IV SOSY
PREFILLED_SYRINGE | INTRAVENOUS | Status: AC
  Filled 2016-08-31: qty 5

## 2016-08-31 MED ORDER — BUPIVACAINE LIPOSOME 1.3 % IJ SUSP
INTRAMUSCULAR | Status: DC | PRN
Start: 2016-08-31 — End: 2016-08-31
  Administered 2016-08-31: 20 mL

## 2016-08-31 MED ORDER — LACTATED RINGERS IV SOLN
INTRAVENOUS | Status: DC
  Administered 2016-08-31 (×2): via INTRAVENOUS

## 2016-08-31 MED ORDER — FENTANYL CITRATE (PF) 100 MCG/2ML IJ SOLN
INTRAMUSCULAR | Status: DC | PRN
  Administered 2016-08-31 (×3): 50 ug via INTRAVENOUS
  Administered 2016-08-31: 100 ug via INTRAVENOUS

## 2016-08-31 MED ORDER — BUPIVACAINE LIPOSOME 1.3 % IJ SUSP
20.0000 mL | Freq: Once | INTRAMUSCULAR | Status: DC
  Filled 2016-08-31: qty 20

## 2016-08-31 MED ORDER — MORPHINE SULFATE (PF) 4 MG/ML IV SOLN
2.0000 mg | INTRAVENOUS | Status: DC | PRN
  Administered 2016-08-31 (×2): 2 mg via INTRAVENOUS
  Administered 2016-09-02: 4 mg via INTRAVENOUS
  Filled 2016-08-31 (×3): qty 1

## 2016-08-31 MED ORDER — DIPHENHYDRAMINE HCL 12.5 MG/5ML PO ELIX: 12.5000 mg | ORAL_SOLUTION | Freq: Four times a day (QID) | ORAL | Status: DC | PRN

## 2016-08-31 MED ORDER — TIMOLOL MALEATE 0.5 % OP SOLN
1.0000 [drp] | Freq: Every day | OPHTHALMIC | Status: DC
  Administered 2016-09-01 – 2016-09-02 (×2): 1 [drp] via OPHTHALMIC
  Filled 2016-08-31: qty 5

## 2016-08-31 MED ORDER — LIDOCAINE 2% (20 MG/ML) 5 ML SYRINGE
INTRAMUSCULAR | Status: AC
  Filled 2016-08-31: qty 5

## 2016-08-31 MED ORDER — PRAVASTATIN SODIUM 40 MG PO TABS
80.0000 mg | ORAL_TABLET | Freq: Every evening | ORAL | Status: DC
  Administered 2016-08-31 – 2016-09-01 (×2): 80 mg via ORAL
  Filled 2016-08-31 (×2): qty 2

## 2016-08-31 MED ORDER — SODIUM CHLORIDE 0.9 % IV BOLUS (SEPSIS)
1000.0000 mL | Freq: Once | INTRAVENOUS | Status: AC
  Administered 2016-08-31: 1000 mL via INTRAVENOUS

## 2016-08-31 MED ORDER — PHENYLEPHRINE 40 MCG/ML (10ML) SYRINGE FOR IV PUSH (FOR BLOOD PRESSURE SUPPORT)
PREFILLED_SYRINGE | INTRAVENOUS | Status: AC
  Filled 2016-08-31: qty 10

## 2016-08-31 MED ORDER — ALPRAZOLAM 0.5 MG PO TABS: 0.5000 mg | ORAL_TABLET | Freq: Three times a day (TID) | ORAL | Status: DC | PRN

## 2016-08-31 MED ORDER — FENTANYL CITRATE (PF) 100 MCG/2ML IJ SOLN
25.0000 ug | INTRAMUSCULAR | Status: DC | PRN
  Administered 2016-08-31: 50 ug via INTRAVENOUS
  Administered 2016-08-31 (×2): 25 ug via INTRAVENOUS

## 2016-08-31 MED ORDER — OXYBUTYNIN CHLORIDE 5 MG PO TABS
5.0000 mg | ORAL_TABLET | Freq: Three times a day (TID) | ORAL | Status: DC | PRN
  Administered 2016-08-31: 5 mg via ORAL

## 2016-08-31 MED ORDER — SUCCINYLCHOLINE CHLORIDE 200 MG/10ML IV SOSY
PREFILLED_SYRINGE | INTRAVENOUS | Status: DC | PRN
  Administered 2016-08-31: 120 mg via INTRAVENOUS

## 2016-08-31 MED ORDER — HYDROCODONE-ACETAMINOPHEN 5-325 MG PO TABS: 1.0000 | ORAL_TABLET | ORAL | Status: DC | PRN

## 2016-08-31 MED ORDER — MEPERIDINE HCL 50 MG/ML IJ SOLN
6.2500 mg | INTRAMUSCULAR | Status: DC | PRN
  Administered 2016-08-31: 12.5 mg via INTRAVENOUS

## 2016-08-31 MED ORDER — FENTANYL CITRATE (PF) 250 MCG/5ML IJ SOLN
INTRAMUSCULAR | Status: AC
  Filled 2016-08-31: qty 5

## 2016-08-31 MED ORDER — ONDANSETRON HCL 4 MG/2ML IJ SOLN: 4.0000 mg | INTRAMUSCULAR | Status: DC | PRN

## 2016-08-31 MED ORDER — ONDANSETRON HCL 4 MG/2ML IJ SOLN
INTRAMUSCULAR | Status: DC | PRN
  Administered 2016-08-31: 4 mg via INTRAVENOUS

## 2016-08-31 MED ORDER — ACETAMINOPHEN 325 MG PO TABS: 650.0000 mg | ORAL_TABLET | ORAL | Status: DC | PRN

## 2016-08-31 MED ORDER — GLYCOPYRROLATE 0.2 MG/ML IV SOSY
PREFILLED_SYRINGE | INTRAVENOUS | Status: AC
  Filled 2016-08-31: qty 5

## 2016-08-31 MED ORDER — MEPERIDINE HCL 50 MG/ML IJ SOLN
INTRAMUSCULAR | Status: AC
  Administered 2016-08-31: 12.5 mg via INTRAVENOUS
  Filled 2016-08-31: qty 1

## 2016-08-31 MED ORDER — PROPOFOL 10 MG/ML IV BOLUS
INTRAVENOUS | Status: DC | PRN
  Administered 2016-08-31: 200 mg via INTRAVENOUS

## 2016-08-31 MED ORDER — ASPIRIN EC 81 MG PO TBEC
81.0000 mg | DELAYED_RELEASE_TABLET | Freq: Every evening | ORAL | Status: DC
  Administered 2016-08-31 – 2016-09-01 (×2): 81 mg via ORAL
  Filled 2016-08-31 (×2): qty 1

## 2016-08-31 MED ORDER — ROCURONIUM BROMIDE 10 MG/ML (PF) SYRINGE
PREFILLED_SYRINGE | INTRAVENOUS | Status: DC | PRN
  Administered 2016-08-31 (×2): 10 mg via INTRAVENOUS
  Administered 2016-08-31: 50 mg via INTRAVENOUS
  Administered 2016-08-31: 5 mg via INTRAVENOUS

## 2016-08-31 MED ORDER — FAMOTIDINE 20 MG PO TABS
20.0000 mg | ORAL_TABLET | Freq: Three times a day (TID) | ORAL | Status: DC
  Administered 2016-09-01 – 2016-09-02 (×4): 20 mg via ORAL
  Filled 2016-08-31 (×4): qty 1

## 2016-08-31 MED ORDER — DEXAMETHASONE SODIUM PHOSPHATE 10 MG/ML IJ SOLN
INTRAMUSCULAR | Status: AC
  Filled 2016-08-31: qty 1

## 2016-08-31 MED ORDER — GLYCOPYRROLATE 0.2 MG/ML IJ SOLN
INTRAMUSCULAR | Status: DC | PRN
  Administered 2016-08-31: 0.2 mg via INTRAVENOUS

## 2016-08-31 MED ORDER — PHENYLEPHRINE HCL 10 MG/ML IJ SOLN
INTRAMUSCULAR | Status: DC | PRN
  Administered 2016-08-31 (×3): 80 ug via INTRAVENOUS

## 2016-08-31 MED ORDER — FENTANYL CITRATE (PF) 100 MCG/2ML IJ SOLN
INTRAMUSCULAR | Status: AC
  Filled 2016-08-31: qty 2

## 2016-08-31 MED ORDER — BELLADONNA ALKALOIDS-OPIUM 16.2-60 MG RE SUPP
1.0000 | Freq: Four times a day (QID) | RECTAL | Status: DC | PRN
  Administered 2016-08-31: 1 via RECTAL
  Filled 2016-08-31: qty 1

## 2016-08-31 MED ORDER — MAGNESIUM CITRATE PO SOLN: 1.0000 | Freq: Once | ORAL | Status: DC

## 2016-08-31 MED ORDER — LACTATED RINGERS IR SOLN
Status: DC | PRN
  Administered 2016-08-31: 1000 mL

## 2016-08-31 MED ORDER — ONDANSETRON HCL 4 MG/2ML IJ SOLN
INTRAMUSCULAR | Status: AC
  Filled 2016-08-31: qty 2

## 2016-08-31 MED ORDER — DEXTROSE-NACL 5-0.45 % IV SOLN
INTRAVENOUS | Status: DC
  Administered 2016-08-31 – 2016-09-01 (×2): via INTRAVENOUS

## 2016-08-31 MED ORDER — SENNOSIDES-DOCUSATE SODIUM 8.6-50 MG PO TABS
2.0000 | ORAL_TABLET | Freq: Every day | ORAL | Status: DC
  Administered 2016-08-31 – 2016-09-01 (×2): 2 via ORAL
  Filled 2016-08-31 (×2): qty 2

## 2016-08-31 MED ORDER — LATANOPROST 0.005 % OP SOLN
1.0000 [drp] | Freq: Every day | OPHTHALMIC | Status: DC
  Administered 2016-08-31 – 2016-09-01 (×2): 1 [drp] via OPHTHALMIC
  Filled 2016-08-31: qty 2.5

## 2016-08-31 MED ORDER — SODIUM CHLORIDE 0.9 % IJ SOLN
INTRAMUSCULAR | Status: DC | PRN
  Administered 2016-08-31: 20 mL

## 2016-08-31 MED ORDER — DIPHENHYDRAMINE HCL 50 MG/ML IJ SOLN: 12.5000 mg | Freq: Four times a day (QID) | INTRAMUSCULAR | Status: DC | PRN

## 2016-08-31 SURGICAL SUPPLY — 67 items
ADH SKN CLS APL DERMABOND .7 (GAUZE/BANDAGES/DRESSINGS) ×2
APPLICATOR COTTON TIP 6IN STRL (MISCELLANEOUS) ×4 IMPLANT
CATH FOLEY 2WAY SLVR 18FR 30CC (CATHETERS) ×4 IMPLANT
CATH TIEMANN FOLEY 18FR 5CC (CATHETERS) ×4 IMPLANT
CHLORAPREP W/TINT 26ML (MISCELLANEOUS) ×4 IMPLANT
CLIP LIGATING HEM O LOK PURPLE (MISCELLANEOUS) ×8 IMPLANT
CLOTH BEACON ORANGE TIMEOUT ST (SAFETY) ×4 IMPLANT
CONT SPECI 4OZ STER CLIK (MISCELLANEOUS) ×4 IMPLANT
COVER TIP SHEARS 8 DVNC (MISCELLANEOUS) ×2 IMPLANT
COVER TIP SHEARS 8MM DA VINCI (MISCELLANEOUS) ×2
CUTTER ECHEON FLEX ENDO 45 340 (ENDOMECHANICALS) ×4 IMPLANT
DECANTER SPIKE VIAL GLASS SM (MISCELLANEOUS) ×2 IMPLANT
DERMABOND ADVANCED (GAUZE/BANDAGES/DRESSINGS) ×2
DERMABOND ADVANCED .7 DNX12 (GAUZE/BANDAGES/DRESSINGS) ×2 IMPLANT
DRAPE ARM DVNC X/XI (DISPOSABLE) ×8 IMPLANT
DRAPE COLUMN DVNC XI (DISPOSABLE) ×2 IMPLANT
DRAPE DA VINCI XI ARM (DISPOSABLE) ×8
DRAPE DA VINCI XI COLUMN (DISPOSABLE) ×2
DRAPE SURG IRRIG POUCH 19X23 (DRAPES) ×4 IMPLANT
DRSG TEGADERM 4X4.75 (GAUZE/BANDAGES/DRESSINGS) ×6 IMPLANT
DRSG TEGADERM 6X8 (GAUZE/BANDAGES/DRESSINGS) ×4 IMPLANT
ELECT REM PT RETURN 15FT ADLT (MISCELLANEOUS) ×4 IMPLANT
GAUZE SPONGE 2X2 8PLY STRL LF (GAUZE/BANDAGES/DRESSINGS) ×2 IMPLANT
GLOVE BIO SURGEON STRL SZ 6.5 (GLOVE) ×3 IMPLANT
GLOVE BIO SURGEONS STRL SZ 6.5 (GLOVE) ×1
GLOVE BIOGEL M STRL SZ7.5 (GLOVE) ×8 IMPLANT
GLOVE BIOGEL PI IND STRL 7.5 (GLOVE) ×2 IMPLANT
GLOVE BIOGEL PI INDICATOR 7.5 (GLOVE) ×2
GOWN STRL REUS W/TWL LRG LVL3 (GOWN DISPOSABLE) ×8 IMPLANT
GOWN STRL REUS W/TWL LRG LVL4 (GOWN DISPOSABLE) ×12 IMPLANT
HOLDER FOLEY CATH W/STRAP (MISCELLANEOUS) ×4 IMPLANT
IRRIG SUCT STRYKERFLOW 2 WTIP (MISCELLANEOUS) ×4
IRRIGATION SUCT STRKRFLW 2 WTP (MISCELLANEOUS) ×2 IMPLANT
IV LACTATED RINGERS 1000ML (IV SOLUTION) ×4 IMPLANT
KIT PROCEDURE DA VINCI SI (MISCELLANEOUS) ×2
KIT PROCEDURE DVNC SI (MISCELLANEOUS) ×2 IMPLANT
NDL INSUFFLATION 14GA 120MM (NEEDLE) ×2 IMPLANT
NDL SPNL 22GX7 QUINCKE BK (NEEDLE) ×2 IMPLANT
NEEDLE INSUFFLATION 14GA 120MM (NEEDLE) ×4 IMPLANT
NEEDLE SPNL 22GX7 QUINCKE BK (NEEDLE) ×4 IMPLANT
PACK ROBOT UROLOGY CUSTOM (CUSTOM PROCEDURE TRAY) ×4 IMPLANT
PAD POSITIONING PINK XL (MISCELLANEOUS) ×2 IMPLANT
PORT ACCESS TROCAR AIRSEAL 12 (TROCAR) ×2 IMPLANT
PORT ACCESS TROCAR AIRSEAL 5M (TROCAR) ×2
RELOAD STAPLE 45 4.1 GRN THCK (STAPLE) ×2 IMPLANT
SEAL CANN UNIV 5-8 DVNC XI (MISCELLANEOUS) ×8 IMPLANT
SEAL XI 5MM-8MM UNIVERSAL (MISCELLANEOUS) ×8
SET TRI-LUMEN FLTR TB AIRSEAL (TUBING) ×4 IMPLANT
SHEET LAVH (DRAPES) IMPLANT
SOLUTION ELECTROLUBE (MISCELLANEOUS) ×4 IMPLANT
SPONGE GAUZE 2X2 STER 10/PKG (GAUZE/BANDAGES/DRESSINGS)
SPONGE LAP 4X18 X RAY DECT (DISPOSABLE) ×4 IMPLANT
STAPLE RELOAD 45 GRN (STAPLE) ×2 IMPLANT
STAPLE RELOAD 45MM GREEN (STAPLE) ×4
SUT ETHILON 3 0 PS 1 (SUTURE) ×4 IMPLANT
SUT MNCRL AB 4-0 PS2 18 (SUTURE) ×8 IMPLANT
SUT PDS AB 1 CT1 27 (SUTURE) ×8 IMPLANT
SUT VIC AB 2-0 CT2 27 (SUTURE) ×8 IMPLANT
SUT VIC AB 2-0 SH 27 (SUTURE) ×4
SUT VIC AB 2-0 SH 27X BRD (SUTURE) ×2 IMPLANT
SUT VICRYL 0 UR6 27IN ABS (SUTURE) ×4 IMPLANT
SUT VLOC BARB 180 ABS3/0GR12 (SUTURE) ×12
SUTURE VLOC BRB 180 ABS3/0GR12 (SUTURE) ×6 IMPLANT
SYR 27GX1/2 1ML LL SAFETY (SYRINGE) ×4 IMPLANT
TOWEL OR 17X26 10 PK STRL BLUE (TOWEL DISPOSABLE) ×4 IMPLANT
TOWEL OR NON WOVEN STRL DISP B (DISPOSABLE) ×4 IMPLANT
WATER STERILE IRR 1500ML POUR (IV SOLUTION) ×4 IMPLANT

## 2016-08-31 NOTE — Anesthesia Procedure Notes (Signed)
Procedure Name: Intubation Date/Time: 08/31/2016 11:22 AM Performed by: Lind Covert Pre-anesthesia Checklist: Patient identified, Emergency Drugs available, Suction available, Patient being monitored and Timeout performed Patient Re-evaluated:Patient Re-evaluated prior to inductionOxygen Delivery Method: Circle system utilized Preoxygenation: Pre-oxygenation with 100% oxygen Intubation Type: IV induction Laryngoscope Size: 4 and Mac Grade View: Grade I Tube type: Oral Tube size: 7.5 mm Airway Equipment and Method: Stylet Placement Confirmation: ETT inserted through vocal cords under direct vision,  positive ETCO2 and breath sounds checked- equal and bilateral Secured at: 23 cm Tube secured with: Tape Dental Injury: Teeth and Oropharynx as per pre-operative assessment

## 2016-08-31 NOTE — H&P (Signed)
Trevor Branch is an 71 y.o. male.    Chief Complaint: Pre-op Robotic Prostatectomy  HPI:   1 - High Risk Prostate Cancer - No FHX prostate cancer. 10/12 cores Gleason 7 and 8 up to 90% of core. All positive exept ro RLA RLM. TRUS BX 77mL with sig median lobe. CT and bone scan 07/2016 w/o locally advanced / distant disease.   PMH sig for CAD/MI/Stent (now not limiting, follow Hochrein, on plavix which he can hold). His PCP is Orpah Melter MD, but [facility]   Today " Trevor Branch " is seen to proceed with robotic prostatectomy + node dissection.     Past Medical History:  Diagnosis Date  . Anxiety   . Arthritis    shoulder   . CAD (coronary artery disease)    a. NSTEMI 3/17 - s/p DES x 2 to mid LAD; staged PCI with DES to OM1; residual CAD - mLCX 40%, pRCA 30%, mRCA 20%   . Cancer ([place])    skin cancer and prostate cancer   . Complication of anesthesia    stopped breathing during surgery age 50 , 2009- SVT ablation - quit breathinig per patient anesthesia had to be reversed per patient   . Depression   . GERD (gastroesophageal reflux disease)   . Glaucoma   . Headache    history of migraines   . History of kidney stones   . History of non-ST elevation myocardial infarction (NSTEMI) 06/2015  . Hyperlipidemia   . Ischemic cardiomyopathy    a. Echo 06/23/15 - EF 35-40%, ant-septal, inferoseptal, apical AK, Gr 1 DD, trivial AI >>  b. Echo 5/17 - EF 55-60%, no RWMA, Gr 1 DD  . Myocardial infarction ([place])   . Panic disorder   . PTSD (post-traumatic stress disorder)    panic attacks  . SVT (supraventricular tachycardia) (H[facility])    s/p prior RFCA    Past Surgical History:  Procedure Laterality Date  . CARDIAC CATHETERIZATION [name] 06/22/2015   Procedure: Left Heart Cath and Coronary Angiography;  Surgeon: Jettie Booze, MD;  Location: [facility] [facility];  Service: Cardiovascular;  Laterality: [name];  . CARDIAC CATHETERIZATION [name] 06/22/2015   Procedure: Coronary Stent  Intervention;  Surgeon: Jettie Booze, MD;  Location: [place] [facility];  Service: Cardiovascular;  Laterality: [name];  . CARDIAC CATHETERIZATION [name] 06/24/2015   Procedure: Left Heart Cath and Coronary Angiography;  Surgeon: Troy Sine, MD;  Location: [place] [facility];  Service: Cardiovascular;  Laterality: [name];  . CARDIAC CATHETERIZATION [name] 06/24/2015   Procedure: Coronary Stent Intervention;  Surgeon: Troy Sine, MD;  Location: [place] [facility];  Service: Cardiovascular;  Laterality: [name];  . coronary stents     . kidney stent     . LITHOTRIPSY     x 3  . SUPRAVENTRICULAR TACHYCARDIA ABLATION  2009   at [facility]  . TONSILLECTOMY      Family History  Problem Relation Age of Onset  . CAD Mother   . Urinary tract infection Mother 73  . CAD Father 66       first MI at age 22  . Hypertension Father   . CAD Brother 48  . Diabetes type II Neg Hx    Social History:  reports that he has never smoked. He has never used smokeless tobacco. He reports that he does not drink alcohol or use drugs.  Allergies:  Allergies  Allergen Reactions  . Other Other (  See Comments)    Uncoded Allergy. Allergen: VERSED, Other Reaction: shaking  Uncoded Allergy. Allergen: ECG adhesive pads, Other Reaction: skin redness and tearing  . Adhesive [Tape]     Pulls off skin if left on for long periods of time  . Atorvastatin     Dizziness   . Doxycycline Hives    GI upset  . Mucinex [Guaifenesin Er] Hives  . Rosuvastatin     dizziness  . Versed [Midazolam]     Shaking, hypothermia, nausea and vomiting - during MRI - 2005   . Imitrex [Sumatriptan] Palpitations    No prescriptions prior to admission.    No results found for this or any previous visit (from the past 48 hour(s)). No results found.  Review of Systems  Constitutional: Negative.  Negative for chills and fever.  HENT: Negative.   Eyes: Negative.   Respiratory: Negative.   Cardiovascular: Negative.   Gastrointestinal:  Negative.   Genitourinary: Negative.  Negative for flank pain.  Musculoskeletal: Negative.   Skin: Negative.   Neurological: Negative.   Endo/Heme/Allergies: Negative.   Psychiatric/Behavioral: Negative.     There were no vitals taken for this visit. Physical Exam  Constitutional: He appears well-developed.  HENT:  Head: Normocephalic.  Eyes: Pupils are equal, round, and reactive to light.  Neck: Normal range of motion.  Cardiovascular: Normal rate.   Respiratory: Effort normal.  GI: Soft.  Genitourinary: Penis normal.  Musculoskeletal: Normal range of motion.  Neurological: He is alert.  Skin: Skin is warm.  Psychiatric: He has a normal mood and affect.     Assessment/Plan  Proceed as planned with robotic prostatectomy + ICG + Node dissection for high risk disease. Risks, benefits, alternatives, expected peri-op course discussed previously and reiterated today.   Alexis Frock, MD 08/31/2016, 6:18 AM

## 2016-08-31 NOTE — Anesthesia Postprocedure Evaluation (Signed)
Anesthesia Post Note  Patient: Trevor Branch  Procedure(s) Performed: Procedure(s) (LRB): XI ROBOTIC ASSISTED LAPAROSCOPIC RADICAL PROSTATECTOMY WITH INDOCYANINE [name] DYE ([name]) PELVIC LYMPHADENECTOMY (Bilateral)  Patient location during evaluation: PACU Anesthesia Type: General Pain management: pain level controlled Vital Signs Assessment: post-procedure vital signs reviewed and stable Respiratory status: spontaneous breathing Cardiovascular status: stable Anesthetic complications: no       Last Vitals:  Vitals:   08/31/16 1545 08/31/16 1600  BP: (!) 144/80 (!) 145/78  Pulse: 65 60  Resp: 12 12  Temp:  36.3 C    Last Pain:  Vitals:   08/31/16 1551  TempSrc:   PainSc: 2                  Marieliz Strang

## 2016-08-31 NOTE — Transfer of Care (Signed)
Immediate Anesthesia Transfer of Care Note  Patient: Trevor Branch  Procedure(s) Performed: Procedure(s): XI ROBOTIC ASSISTED LAPAROSCOPIC RADICAL PROSTATECTOMY WITH INDOCYANINE GREEN DYE ([name]) PELVIC LYMPHADENECTOMY (Bilateral)  Patient Location: PACU  Anesthesia Type:General  Level of Consciousness: awake, alert , oriented and patient cooperative  Airway & Oxygen Therapy: Patient Spontanous Breathing and Patient connected to face mask oxygen  Post-op Assessment: Report given to RN, Post -op Vital signs reviewed and stable and Patient moving all extremities X 4  Post vital signs: stable  Last Vitals:  Vitals:   08/31/16 1005  BP: 116/70  Pulse: (!) 57  Resp: 16  Temp: 36.5 C    Last Pain:  Vitals:   08/31/16 1005  TempSrc: Oral      Patients Stated Pain Goal: 3 (32/67/12 4580)  Complications: No apparent anesthesia complications

## 2016-08-31 NOTE — Interval H&P Note (Signed)
History and Physical Interval Note:  08/31/2016 11:09 AM  Trevor Branch  has presented today for surgery, with the diagnosis of PROSTATE CANCER  The various methods of treatment have been discussed with the patient and family. After consideration of risks, benefits and other options for treatment, the patient has consented to  Procedure(s): XI ROBOTIC [place] ([name]) PELVIC LYMPHADENECTOMY (Bilateral) as a surgical intervention .  The patient's history has been reviewed, patient examined, no change in status, stable for surgery.  I have reviewed the patient's chart and labs.  Questions were answered to the patient's satisfaction.     Akelia Husted

## 2016-08-31 NOTE — Discharge Instructions (Addendum)
1- Drain Sites - You may have some mild persistent drainage from old drain site for several days, this is normal. This can be covered with cotton gauze for convenience.  2 - Stiches - Your stitches are all dissolvable. You may notice a "loose thread" at your incisions, these are normal and require no intervention. You may cut them flush to the skin with fingernail clippers if needed for comfort.  3 - Diet - No restrictions  4 - Activity - No heavy lifting / straining (any activities that require valsalva or "bearing down") x 4 weeks. Otherwise, no restrictions.  5 - Bathing - You may shower immediately. Do not take a bath or get into swimming pool where incision sites are submersed in water x 4 weeks.   6 - Catheter - Will remain in place until removed at your next appointment. It may be cleaned with soap and water in the shower. It may be disconnected from the drain bag while in the shower to avoid tripping over the tube. You may apply Neosporin or Vaseline ointment as needed to the tip of the penis where the catheter inserts to reduce friction and irritation in this spot.   7 - When to Call the Doctor - Call MD for any fever >102, any acute wound problems, or any severe nausea / vomiting. You can call the [facility] [phone]) 24 hours a day 365 days a year. It will roll-over to the answering service and on-call physician after hours.  8 - Medications - Continue taking your baby aspirin. Start taking the Cipro antibiotic the day before your follow-up appointment. Take it the day before, day of, and day after.

## 2016-08-31 NOTE — Anesthesia Preprocedure Evaluation (Addendum)
Anesthesia Evaluation  Patient identified by MRN, date of birth, ID band Patient awake    Reviewed: Allergy & Precautions, NPO status , Patient's Chart, lab work & pertinent test results  Airway Mallampati: II  TM Distance: >3 FB     Dental   Pulmonary    breath sounds clear to auscultation       Cardiovascular + CAD and + Past MI   Rhythm:Regular Rate:Normal     Neuro/Psych  Headaches,    GI/Hepatic Neg liver ROS, GERD  ,  Endo/Other  negative endocrine ROS  Renal/GU negative Renal ROS     Musculoskeletal  (+) Arthritis ,   Abdominal   Peds  Hematology   Anesthesia Other Findings   Reproductive/Obstetrics                            Anesthesia Physical Anesthesia Plan  ASA: III  Anesthesia Plan: General   Post-op Pain Management:    Induction: Intravenous  Airway Management Planned: Oral ETT  Additional Equipment:   Intra-op Plan:   Post-operative Plan: Possible Post-op intubation/ventilation  Informed Consent: I have reviewed the patients History and Physical, chart, labs and discussed the procedure including the risks, benefits and alternatives for the proposed anesthesia with the patient or authorized representative who has indicated his/her understanding and acceptance.   Dental advisory given  Plan Discussed with: CRNA and Anesthesiologist  Anesthesia Plan Comments:         Anesthesia Quick Evaluation

## 2016-08-31 NOTE — Brief Op Note (Signed)
08/31/2016  2:36 PM  PATIENT:  Trevor Branch  71 y.o. male  PRE-OPERATIVE DIAGNOSIS:  PROSTATE CANCER  POST-OPERATIVE DIAGNOSIS:  PROSTATE CANCER  PROCEDURE:  Procedure(s): XI ROBOTIC ASSISTED LAPAROSCOPIC RADICAL PROSTATECTOMY WITH INDOCYANINE GREEN DYE ([name]) PELVIC LYMPHADENECTOMY (Bilateral)  SURGEON:  Surgeon(s) and Role:    * Alexis Frock, MD - Primary  PHYSICIAN ASSISTANT:   ASSISTANTS: Azucena Fallen NP   ANESTHESIA:   local and general  EBL:  Total I/O In: 1000 [I.V.:1000] Out: 150 [Blood:150]  BLOOD ADMINISTERED:none  DRAINS: JP to bulb, Foley to gravity   LOCAL MEDICATIONS USED:  MARCAINE     SPECIMEN:  Source of Specimen:  1 - prostatectomy, 2 -periprostatic fat, 3 - pelvic lymph nodes  DISPOSITION OF SPECIMEN:  PATHOLOGY  COUNTS:  YES  TOURNIQUET:  * No tourniquets in log *  DICTATION: .Other Dictation: Dictation Number [id number]  PLAN OF CARE: Admit to inpatient   PATIENT DISPOSITION:  PACU - hemodynamically stable.   Delay start of Pharmacological VTE agent (>24hrs) due to surgical blood loss or risk of bleeding: yes

## 2016-09-01 ENCOUNTER — Encounter (HOSPITAL_COMMUNITY): Payer: Self-pay | Admitting: Urology

## 2016-09-01 DIAGNOSIS — C61 Malignant neoplasm of prostate: Secondary | ICD-10-CM | POA: Diagnosis not present

## 2016-09-01 LAB — HEMOGLOBIN AND HEMATOCRIT, BLOOD
HCT: 34.3 % — ABNORMAL LOW (ref 39.0–52.0)
HEMOGLOBIN: 11.3 g/dL — AB (ref 13.0–17.0)

## 2016-09-01 LAB — BASIC METABOLIC PANEL
ANION GAP: 4 — AB (ref 5–15)
BUN: 12 mg/dL (ref 6–20)
CALCIUM: 8 mg/dL — AB (ref 8.9–10.3)
CO2: 27 mmol/L (ref 22–32)
Chloride: 107 mmol/L (ref 101–111)
Creatinine, Ser: 1.13 mg/dL (ref 0.61–1.24)
GFR calc Af Amer: >60 mL/min (ref >60)
GFR calc non Af Amer: >60 mL/min (ref >60)
GLUCOSE: 154 mg/dL — AB (ref 65–99)
Potassium: 5.2 mmol/L — ABNORMAL HIGH (ref 3.5–5.1)
Sodium: 138 mmol/L (ref 135–145)

## 2016-09-01 LAB — CREATININE, FLUID (PLEURAL, PERITONEAL, JP DRAINAGE): Creat, Fluid: 1.2 mg/dL

## 2016-09-01 NOTE — Op Note (Signed)
NAME:  JERRELL, MANGEL NO.:  MEDICAL RECORD NO.:  [id number]  LOCATION:                                 FACILITY:  PHYSICIAN:  Alexis Frock, MD          DATE OF BIRTH:  DATE OF PROCEDURE: 08/31/2016                               OPERATIVE REPORT   DIAGNOSIS:  High-risk prostate cancer.  PROCEDURES: 1. Robotic-assisted laparoscopic radical prostatectomy. 2. Bilateral pelvic lymphadenectomy. 3. Injection of indocyanine green dye for sentinel lymphangiography.  ESTIMATED BLOOD LOSS:  150 mL.  COMPLICATION:  None.  SPECIMENS: (sentinal nodes separately labelled for pathology) 1. Periprostatic fat. 2. Radical prostatectomy. 3. Right external iliac lymph nodes. 4. Right obturator lymph nodes. 5. Left external iliac lymph nodes. 6. Left obturator lymph nodes.  ASSISTANT:  Azucena Fallen, NP.  FINDINGS: 1. Slightly enlarged pelvic lymph nodes bilaterally. 2. Several sentinel lymph nodes, these were annotated as per     pathology. 3. Significant median lobe, requiring bladder neck reconstruction as     expected.  INDICATION:  Mr. David is a 71 year old gentleman, who was found to have a very large volume high-risk adenocarcinoma of the prostate on workup of elevated PSA.  He also has a median lobe in obstructive anatomy at baseline.  He underwent staging imaging, which revealed localized disease.  Options for management were discussed including surgical therapies versus ablative therapies and wished to proceed with primary surgery with curative intent.  Informed consent was obtained and placed in the medical record.  PROCEDURE IN DETAIL:  The patient being Trevor Branch was verified. Procedure being robotic prostatectomy was confirmed.  Procedure was carried out.  Time-out was performed.  Intravenous antibiotics were administered.  General endotracheal anesthesia was introduced.  The patient was placed into a low lithotomy position and sterile field  was created by prepping and draping the patient's penis, perineum and proximal thighs using iodine in his infra-xiphoid abdomen using chlorhexidine gluconate, his arms were tucked at his side.  He was further fashioned to the operative table using 3-inch tape over foam padding across his chest.  A test of steep Trendelenburg positioning was performed and was found to be suitably positioned.  Next, a Foley catheter was placed per urethra to straight drain and a high-flow, low- pressure pneumoperitoneum was obtained using Veress technique in the infraumbilical midline having passed the aspiration and drop test.  An 8- mm robotic camera port was placed into the same location.  Laparoscopic examination of the peritoneal cavity revealed no significant adhesions, no visceral injury.  Additional ports were then placed as follows: Right paramedian 8-mm robotic port, right far lateral 12-mm AirSeal assistant port, right paramedian 5-mm suction port, left paramedian 8-mm robotic port, left far lateral 8-mm robotic port.  Robot was docked and passed through the electronic checks.  Initial attention was directed at development of space of Retzius.  Incision was made lateral to the right medial umbilical ligament from midline towards the area of the internal ring coursing along the iliac vessels towards the area of the ureter. Vas deferens were purposely ligated during this dissection and the right bladder  wall was swept away from the lateral aspect of the pelvis toward the endopelvic fascia on the right side.  A mirror-imaged dissection was performed on the left and anterior attachments were taken down with cautery scissors.  This exposed the anterior base of the prostate, which was then defatted with the fat, set aside and labeled as periprostatic fat.  Next, 0.2 mL of indocyanine green dye was injected into each lobe of the prostate using a percutaneously-placed robotically-guided spinal needle  with intervening suction to avoid dye spillage, which did not occur.  Next, the endopelvic fascia was carefully swept away from the lateral aspect of the prostate in a base-to-apex orientation bilaterally, this exposed the dorsal venous complex, which was controlled using vascular load stapler, taking exquisite care to avoid membranous urethral injury, which did not occur grossly, had been approximately 10 minutes post-dye injection and the pelvis was inspected under near-infrared fluorescence light.  There were numerous sentinel lymphatic channels seen coursing on both sides of the pelvis, some to the right external iliac lymph nodes and some to the left as well.  Next, lymphadenectomy was performed on the right side of the right external iliac lymph nodes, the confines being right external iliac artery, vein, pelvic side wall, iliac bifurcation. Lymphostasis was achieved with cold clips.  There was a single dominant sentinel lymph node, this was annotated as such.  Next, the right obturator group with confines being right external iliac vein, pelvic sidewall and obturator nerve, carefully dissected free.  Lymphostasis was achieved with cold clips, set aside, labeled as right obturator lymph nodes.  The obturator nerve was inspected following these maneuvers and found to be intact.  A mirror-image lymphadenectomy was performed on the left side of left external iliac and left obturator groups prospectively again ensuring no left obturator nerve injury and annotations of sentinel lymph nodes were appropriate.  Attention was then directed at bladder neck dissection.  The patient was known to have significant median lobe.  Bladder neck was identified in the anterior plane moving the Foley catheter back and forth and the lateral release was performed.  The bladder neck was then released in an anterior- posterior direction keeping what appeared to be a rim of circular muscle fibers with each  end of the specimen.  The median lobe was then encountered and the large figure-of-eight suture placed into this with the use of bucket handle for superior retraction and very careful dissection was performed posteriorly of the bladder neck and with direct visualization of ureteral orifices to avoid injury to these structures. Posterior dissection was then performed by incising approximately 7 mm inferior-posterior to the posterior leaf of the prostate entering the plane of Denonvilliers.  Bilateral vas deferens were encountered, dissected for distance approximately 4 cm, ligated and placed on gentle superior traction.  Bilateral seminal vesicles were dissected to the tips and also placed on gentle superior traction.  Dissection proceeded within this plane towards the area of the apex of the prostate.  This exposed the vascular pedicles, which were then controlled using a sequential clipping technique, first on the right side, then on the left side.  Final apical dissection was performed on the anterior plane keeping what appeared to be an adequate membranous urethral stump.  This completely freed up the prostatectomy specimen, which was placed into an EndoCatch bag for later retrieval.  Next, digital rectal exam was performed with indicator glove under laparoscopic vision.  No evidence of rectal violation was noted.  Posterior reconstruction was  performed using a single 3-0 V-Loc suture reapproximating the posterior urethral plate to the posterior bladder neck bringing these structures in a tension-free apposition.  Given the large median lobe, there was somewhat large caliber bladder neck as anticipated.  Bladder neck reconstruction was then performed using a figure-of-eight 2-0 Vicryl on each side.  This also allowed folding end of the ureters to push some further away from the area of anastomosis.  Next, urethral to bladder anastomosis was performed using double-armed 3-0 V-Loc suture  from the 6 o'clock to 12 o'clock position, which resulted in excellent mucosal apposition.  A new Foley catheter was then placed per urethra, which irrigated quantitatively.  All sponge and needle counts were correct. Hemostasis appeared excellent.  A closed suction drain was brought through the previous left lateral most robotic port site near the peritoneal cavity.  Drain stitch was applied.  The previous right 12-mm assistant port site was closed at the level of the fascia using Eulas Post- Thomason suture passer and 0 Vicryl.  Specimen was retrieved by extending the previous camera port site inferiorly for total distance approximately 4 cm, removing the prostatectomy specimen, setting aside for permanent pathology.  This site was closed at the level of the fascia using figure-of-eight PDS x4 with reapproximation of Scarpa's using running Vicryl.  All incision sites were infiltrated with dilute lyophilized Marcaine and closed at the level of the skin using subcuticular Monocryl followed by Dermabond, procedure was then terminated.  The patient tolerated the procedure well.  There were no immediate periprocedural complications.  The patient was taken to the postanesthesia care unit in stable condition.  Please note, first assistant, Azucena Fallen was absolutely crucial for all robotic portions of the procedure today.  She provided invaluable suctioning, retraction, clip application, vascular load stapler, firing, without which, this would not be possible.          ______________________________ Alexis Frock, MD     TM/MEDQ  D:  08/31/2016  T:  08/31/2016  Job:  [id number]

## 2016-09-01 NOTE — Progress Notes (Signed)
Urology Progress Note  1 Day Post-Op  Subjective: The patient is doing well.  No nausea or vomiting. Pain is adequately controlled.  Objective: Vital signs in last 24 hours: Temp:  [97.3 F (36.3 C)-98 F (36.7 C)] 97.7 F (36.5 C) (05/24 0516) Pulse Rate:  [54-80] 54 (05/24 0516) Resp:  [10-22] 15 (05/24 0516) BP: (109-160)/(58-90) 109/58 (05/24 0516) SpO2:  [94 %-100 %] 94 % (05/24 0516) Weight:  [86.2 kg (190 lb)] 86.2 kg (190 lb) (05/23 1033)  Intake/Output from previous day: 05/23 0701 - 05/24 0700 In: 5567.5 [P.O.:480; I.V.:4087.5; IV Piggyback:1000] Out: 2940 [Urine:2500; Drains:290; Blood:150] Intake/Output this shift: No intake/output data recorded.  Physical Exam:  General: Alert and oriented. CV: RRR Lungs: Clear bilaterally. GI: Soft, Nondistended. Incisions: Clean, dry, and intact Urine: Clear Extremities: Nontender, no erythema, no edema.  Lab Results:  Recent Labs  08/31/16 1519 09/01/16 0506  HGB 12.3* 11.3*  HCT 37.0* 34.3*      Assessment/Plan: POD#1 s/p RALP  1. D/c IV fluids 2. Regular diet 3. JP creatinine. Continue drain for now.  4. Likely discharge later today.  5. Foley to drainage to follow-up appointment.    LOS: 0 days   Josiah Wojtaszek R 09/01/2016, 7:06 AM

## 2016-09-01 NOTE — Progress Notes (Signed)
Pt requested a 4 wheel walker, because he feels weak and need it for balance while walking at home. No PT needed.

## 2016-09-02 DIAGNOSIS — C61 Malignant neoplasm of prostate: Secondary | ICD-10-CM | POA: Diagnosis not present

## 2016-09-02 MED ORDER — SENNOSIDES-DOCUSATE SODIUM 8.6-50 MG PO TABS: 1.0000 | ORAL_TABLET | Freq: Two times a day (BID) | ORAL | 0 refills | Status: DC

## 2016-09-02 NOTE — Discharge Summary (Signed)
Date of admission: 08/31/2016  Date of discharge: 09/02/2016  Admission diagnosis: Prostate cancer  Discharge diagnosis: Same  History and Physical: For full details, please see admission history and physical. Briefly, Trevor Branch is a 71 y.o. male with prostate cancer. After discussing management/treatment options, he  elected to proceed with surgical treatment.  Hospital Course: Trevor Branch was taken to the operating room on 08/31/2016 and underwent a robotic radical prostatectomy. He tolerated this procedure well and without complications. Postoperatively, the patient was able to be transferred to a regular hospital room following recovery from anesthesia.  They were able to begin ambulating the night of surgery and remained hemodynamically stable overnight.  On POD#1, his JP creatinine was normal and drain was removed. They was transitioned to oral pain medication, tolerated a regular diet, and had met all discharge criteria and was able to be discharged home on POD#2. He was maintained on ASA peri-op. He is not taking Plavix anymore per his cardiologist.    Laboratory values:   Recent Labs  08/31/16 1519 09/01/16 0506  HGB 12.3* 11.3*  HCT 37.0* 34.3*    Disposition: Home  Discharge instruction: They were instructed to be ambulatory but to refrain from heavy lifting, strenuous activity, or driving.  Discharge medications:   Allergies as of 09/02/2016      Reactions   Other Other (See Comments)   Uncoded Allergy. Allergen: VERSED, Other Reaction: shaking  Uncoded Allergy. Allergen: ECG adhesive pads, Other Reaction: skin redness and tearing   Adhesive [tape]    Pulls off skin if left on for long periods of time   Atorvastatin    Dizziness    Doxycycline Hives   GI upset   Mucinex [guaifenesin Er] Hives   Rosuvastatin    dizziness   Versed [midazolam]    Shaking, hypothermia, nausea and vomiting - during MRI - 2005    Imitrex [sumatriptan] Palpitations      Medication  List    STOP taking these medications   tamsulosin 0.4 MG Caps capsule Commonly known as:  FLOMAX     TAKE these medications   acetaminophen 500 MG tablet Commonly known as:  TYLENOL Take 1,000 mg by mouth daily as needed for moderate pain.   ALPRAZolam 0.5 MG tablet Commonly known as:  XANAX Take 0.5 mg by mouth as needed for anxiety.   aspirin EC 81 MG tablet Take 1 tablet (81 mg total) by mouth daily. What changed:  when to take this   aspirin-acetaminophen-caffeine 250-250-65 MG tablet Commonly known as:  EXCEDRIN MIGRAINE Take 2 tablets by mouth daily as needed for headache or migraine.   cycloSPORINE 0.05 % ophthalmic emulsion Commonly known as:  RESTASIS Place 1 drop into both eyes 2 (two) times daily.   famotidine 20 MG tablet Commonly known as:  PEPCID Take 20 mg by mouth 3 (three) times daily with meals.   latanoprost 0.005 % ophthalmic solution Commonly known as:  XALATAN Place 1 drop into both eyes at bedtime.   pravastatin 80 MG tablet Commonly known as:  PRAVACHOL Take 1 tablet (80 mg total) by mouth daily. What changed:  when to take this   REFRESH OP Apply 1 drop to eye daily as needed (dry eyes).   timolol 0.5 % ophthalmic solution Commonly known as:  TIMOPTIC Place 1 drop into both eyes daily.            Durable Medical Equipment        Start  Ordered   09/01/16 1216  For home use only DME 4 wheeled rolling walker with seat  Once    Question:  Patient needs a walker to treat with the following condition  Answer:  Balance problems   09/01/16 1216      Followup: He will follow-up as scheduled for post-op check.

## 2016-09-02 NOTE — Progress Notes (Signed)
Reviewed Foley care instructions with patient and wife. Patient did return demonstration. Supplies given. Eulas Post, RN

## 2016-09-12 DIAGNOSIS — N393 Stress incontinence (female) (male): Secondary | ICD-10-CM | POA: Diagnosis not present

## 2016-09-27 DIAGNOSIS — M6281 Muscle weakness (generalized): Secondary | ICD-10-CM | POA: Diagnosis not present

## 2016-09-27 DIAGNOSIS — N393 Stress incontinence (female) (male): Secondary | ICD-10-CM | POA: Diagnosis not present

## 2016-09-27 DIAGNOSIS — M62838 Other muscle spasm: Secondary | ICD-10-CM | POA: Diagnosis not present

## 2016-09-27 DIAGNOSIS — R102 Pelvic and perineal pain: Secondary | ICD-10-CM | POA: Diagnosis not present

## 2016-10-14 DIAGNOSIS — R109 Unspecified abdominal pain: Secondary | ICD-10-CM | POA: Diagnosis not present

## 2016-10-14 DIAGNOSIS — K5730 Diverticulosis of large intestine without perforation or abscess without bleeding: Secondary | ICD-10-CM | POA: Diagnosis not present

## 2016-10-24 DIAGNOSIS — L821 Other seborrheic keratosis: Secondary | ICD-10-CM | POA: Diagnosis not present

## 2016-10-24 DIAGNOSIS — D485 Neoplasm of uncertain behavior of skin: Secondary | ICD-10-CM | POA: Diagnosis not present

## 2016-10-24 DIAGNOSIS — L570 Actinic keratosis: Secondary | ICD-10-CM | POA: Diagnosis not present

## 2016-12-13 DIAGNOSIS — C61 Malignant neoplasm of prostate: Secondary | ICD-10-CM | POA: Diagnosis not present

## 2016-12-14 ENCOUNTER — Encounter: Payer: Self-pay | Admitting: *Deleted

## 2016-12-14 ENCOUNTER — Telehealth: Payer: Self-pay | Admitting: *Deleted

## 2016-12-14 NOTE — Telephone Encounter (Signed)
Pt called today for 18 month phone visit. Pt is doing well, no complaints of cp, sob, or bleeding from gums or nose. Pt is only taking ASA 81 daily. Plavix was stopped back before last procedure. Thanked pt for completing the study. He will continue his cardiology care with Dr. Percival Spanish.

## 2016-12-14 NOTE — Progress Notes (Unsigned)
Open in error

## 2016-12-15 ENCOUNTER — Other Ambulatory Visit: Payer: PPO

## 2016-12-15 DIAGNOSIS — E7800 Pure hypercholesterolemia, unspecified: Secondary | ICD-10-CM

## 2016-12-15 DIAGNOSIS — Z79899 Other long term (current) drug therapy: Secondary | ICD-10-CM

## 2016-12-16 DIAGNOSIS — Z79899 Other long term (current) drug therapy: Secondary | ICD-10-CM | POA: Diagnosis not present

## 2016-12-16 DIAGNOSIS — E7800 Pure hypercholesterolemia, unspecified: Secondary | ICD-10-CM | POA: Diagnosis not present

## 2016-12-16 LAB — LIPID PANEL
CHOL/HDL RATIO: 3.7 ratio (ref 0.0–5.0)
Cholesterol, Total: 174 mg/dL (ref 100–199)
HDL: 47 mg/dL (ref >39)
LDL CALC: 105 mg/dL — AB (ref 0–99)
Triglycerides: 109 mg/dL (ref 0–149)
VLDL Cholesterol Cal: 22 mg/dL (ref 5–40)

## 2016-12-16 LAB — PLEASE NOTE

## 2016-12-19 DIAGNOSIS — N393 Stress incontinence (female) (male): Secondary | ICD-10-CM | POA: Diagnosis not present

## 2016-12-19 DIAGNOSIS — N486 Induration penis plastica: Secondary | ICD-10-CM | POA: Diagnosis not present

## 2016-12-19 DIAGNOSIS — C61 Malignant neoplasm of prostate: Secondary | ICD-10-CM | POA: Diagnosis not present

## 2016-12-19 DIAGNOSIS — N5201 Erectile dysfunction due to arterial insufficiency: Secondary | ICD-10-CM | POA: Diagnosis not present

## 2016-12-27 NOTE — Progress Notes (Signed)
Cardiology Office Note   Date:  12/28/2016   ID:  Trevor Branch, DOB Apr 03, 1946, MRN [id number]  PCP:  Orpah Melter, MD  Cardiologist:   Minus Breeding, MD   Chief Complaint  Patient presents with  . Coronary Artery Disease     History of Present Illness: Trevor Branch is a 71 y.o. male who presents for follow up of CAD.  He had  NSTEM last year. LHC demonstrated OM1 80%, mid LAD 99%. He underwent PCI with DES x 2 to the mLAD. Echo confirmed ischemic CM with EF 35-40% with ant-septal, inf-septal and apical wall motion abnormalities. He returned to the Cath Lab 3/15 and underwent staged PCI of the OM1 with a Synergy DES. LVEDP 17-19 mmHg. No beta-blocker was started due to bradycardia. He was not put on ACE inhibitor due to low BP. He is allergic to statins and was put on Zetia. 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  He had a follow up echo in May that demonstrated a low normal EF without mention of effusion. In March he had near syncope with no clear etiology.  Now he returns for follow up.  Since I last saw him he did have radical prostatectomy.    Since I last saw him he has done well.  He has not been able to exercise as much as he would like.  There are stray dogs where he would walk.  The patient denies any new symptoms such as chest discomfort, neck or arm discomfort. There has been no new shortness of breath, PND or orthopnea. There have been no reported palpitations, presyncope or syncope.  He did have some fleeting pain on his armpits a couple of times but this was not like his previous angina. He has had none of the presyncope he had one time while at the [place].   Past Medical History:  Diagnosis Date  . Anxiety   . Arthritis    shoulder   . CAD (coronary artery disease)    a. NSTEMI 3/17 - s/p DES x 2 to mid LAD; staged PCI with DES to OM1; residual CAD - mLCX 40%, pRCA 30%, mRCA 20%   . Cancer ([place])     skin cancer and prostate cancer   . Complication of anesthesia    stopped breathing during surgery age 52 , 2009- SVT ablation - quit breathinig per patient anesthesia had to be reversed per patient   . Depression   . GERD (gastroesophageal reflux disease)   . Glaucoma   . Headache    history of migraines   . History of kidney stones   . History of non-ST elevation myocardial infarction (NSTEMI) 06/2015  . Hyperlipidemia   . Ischemic cardiomyopathy    a. Echo 06/23/15 - EF 35-40%, ant-septal, inferoseptal, apical AK, Gr 1 DD, trivial AI >>  b. Echo 5/17 - EF 55-60%, no RWMA, Gr 1 DD  . Myocardial infarction ([place])   . Panic disorder   . PTSD (post-traumatic stress disorder)    panic attacks  . SVT (supraventricular tachycardia) ([facility])    s/p prior RFCA    Past Surgical History:  Procedure Laterality Date  . CARDIAC CATHETERIZATION [name] 06/22/2015   Procedure: Left Heart Cath and Coronary Angiography;  Surgeon: Jettie Booze, MD;  Location: [facility];  Service: Cardiovascular;  Laterality: [name];  . CARDIAC CATHETERIZATION [name] 06/22/2015   Procedure: Coronary Stent Intervention;  Surgeon: Jettie Booze, MD;  Location: [facility];  Service: Cardiovascular;  Laterality: [name];  . CARDIAC CATHETERIZATION [name] 06/24/2015   Procedure: Left Heart Cath and Coronary Angiography;  Surgeon: Troy Sine, MD;  Location: [facility];  Service: Cardiovascular;  Laterality: [name];  . CARDIAC CATHETERIZATION [name] 06/24/2015   Procedure: Coronary Stent Intervention;  Surgeon: Troy Sine, MD;  Location: [facility];  Service: Cardiovascular;  Laterality: [name];  . coronary stents     . kidney stent     . LITHOTRIPSY     x 3  . LYMPHADENECTOMY Bilateral 08/31/2016   Procedure: PELVIC LYMPHADENECTOMY;  Surgeon: Alexis Frock, MD;  Location: [facility];  Service: Urology;  Laterality: Bilateral;  . ROBOT ASSISTED LAPAROSCOPIC RADICAL PROSTATECTOMY [name] 08/31/2016    Procedure: XI ROBOTIC ASSISTED LAPAROSCOPIC RADICAL PROSTATECTOMY WITH INDOCYANINE GREEN DYE;  Surgeon: Alexis Frock, MD;  Location: [facility];  Service: Urology;  Laterality: [name];  . SUPRAVENTRICULAR TACHYCARDIA ABLATION  2009   at [facility]  . TONSILLECTOMY       Current Outpatient Prescriptions  Medication Sig Dispense Refill  . acetaminophen (TYLENOL) 500 MG tablet Take 1,000 mg by mouth daily as needed for moderate pain.    Marland Kitchen ALPRAZolam (XANAX) 0.5 MG tablet Take 0.5 mg by mouth as needed for anxiety.    Marland Kitchen aspirin EC 81 MG tablet Take 1 tablet (81 mg total) by mouth daily. (Patient taking differently: Take 81 mg by mouth every evening. ) 90 tablet 3  . aspirin-acetaminophen-caffeine (EXCEDRIN MIGRAINE) 250-250-65 MG tablet Take 2 tablets by mouth daily as needed for headache or migraine.    . cycloSPORINE (RESTASIS) 0.05 % ophthalmic emulsion Place 1 drop into both eyes 2 (two) times daily.    . famotidine (PEPCID) 20 MG tablet Take 20 mg by mouth 3 (three) times daily with meals.    . latanoprost (XALATAN) 0.005 % ophthalmic solution Place 1 drop into both eyes at bedtime.     . Polyvinyl Alcohol-Povidone (REFRESH OP) Apply 1 drop to eye daily as needed (dry eyes).    . pravastatin (PRAVACHOL) 80 MG tablet Take 1 tablet (80 mg total) by mouth every evening. 90 tablet 3  . timolol (TIMOPTIC) 0.5 % ophthalmic solution Place 1 drop into both eyes daily.    Marland Kitchen ezetimibe (ZETIA) 10 MG tablet Take 1 tablet (10 mg total) by mouth daily. 90 tablet 3   No current facility-administered medications for this visit.     Allergies:   Other; Adhesive [tape]; Atorvastatin; Doxycycline; Mucinex [guaifenesin er]; Rosuvastatin; Versed [midazolam]; and Imitrex [sumatriptan]    ROS:  Please see the history of present illness.   Otherwise, review of systems are positive for none.   All other systems are reviewed and negative.    PHYSICAL EXAM: VS:  BP 120/71   Pulse 60   Ht 6\' 1"  (1.854 m)   Wt 192 lb  (87.1 kg)   BMI 25.33 kg/m  , BMI Body mass index is 25.33 kg/m.  GENERAL:  Well appearing NECK:  No jugular venous distention, waveform within normal limits, carotid upstroke brisk and symmetric, no bruits, no thyromegaly LUNGS:  Clear to auscultation bilaterally CHEST:  Unremarkable HEART:  PMI not displaced or sustained,S1 and S2 within normal limits, no S3, no S4, no clicks, no rubs, no murmurs ABD:  Flat, positive bowel sounds normal in frequency in pitch, no bruits, no rebound, no guarding, no midline pulsatile mass, no hepatomegaly, no splenomegaly  EXT:  2 plus pulses throughout, no edema, no cyanosis no clubbing   EKG:  EKG is  ordered today. Sinus bradycardia, rate 54, axis rightward , incomplete right bundle branch block, no acute ST-T wave changes.   Recent Labs: 06/15/2016: ALT 16; B Natriuretic Peptide 19.6; TSH 1.074 08/26/2016: Platelets 186 09/01/2016: BUN 12; Creatinine, Ser 1.13; Hemoglobin 11.3; Potassium 5.2; Sodium 138    Lipid Panel    Component Value Date/Time   CHOL 174 12/16/2016 0000   TRIG 109 12/16/2016 0000   HDL 47 12/16/2016 0000   CHOLHDL 3.7 12/16/2016 0000   CHOLHDL 4.4 06/20/2015 0658   VLDL 14 06/20/2015 0658   LDLCALC 105 (H) 12/16/2016 0000   LDLDIRECT 173.8 10/03/2007 0721      Wt Readings from Last 3 Encounters:  12/28/16 192 lb (87.1 kg)  08/31/16 190 lb (86.2 kg)  08/26/16 190 lb (86.2 kg)      Other studies Reviewed: Additional studies/ records that were reviewed today include: Labs Review of the above records demonstrates:       ASSESSMENT AND PLAN:  Near Syncope:  He has had no recurrence of this.  No change in therapy is planned.   CAD -    The patient has no new sypmtoms.  No further cardiovascular testing is indicated.  We Trevor continue with aggressive risk reduction and meds as listed.  CKD Stage II:   His last creat was 1.13.  This is improved.  No change in therapy.  Ischemic CM -     This was improved on the most  recent echo.  No change in therapy.   HL -      He is tolerating pravastatin .  He has not tolerated other statins and he is not at target LDL .  I Trevor add Zetia and check lipids in 10 weeks.    Current medicines are reviewed at length with the patient today.  The patient does not have concerns regarding medicines.  The following changes have been made:  As above.  Labs/ tests ordered today include:     Orders Placed This Encounter  Procedures  . Lipid Profile  . EKG 12-Lead     Disposition:   FU with me in 6 months.    Signed, Minus Breeding, MD  12/28/2016 10:26 PM    [facility]

## 2016-12-28 ENCOUNTER — Ambulatory Visit (INDEPENDENT_AMBULATORY_CARE_PROVIDER_SITE_OTHER): Payer: PPO | Admitting: Cardiology

## 2016-12-28 ENCOUNTER — Encounter: Payer: Self-pay | Admitting: Cardiology

## 2016-12-28 VITALS — BP 120/71 | HR 60 | Ht 73.0 in | Wt 192.0 lb

## 2016-12-28 DIAGNOSIS — Z79899 Other long term (current) drug therapy: Secondary | ICD-10-CM

## 2016-12-28 DIAGNOSIS — E785 Hyperlipidemia, unspecified: Secondary | ICD-10-CM

## 2016-12-28 DIAGNOSIS — I255 Ischemic cardiomyopathy: Secondary | ICD-10-CM

## 2016-12-28 MED ORDER — EZETIMIBE 10 MG PO TABS: 10.0000 mg | ORAL_TABLET | Freq: Every day | ORAL | 3 refills | Status: DC

## 2016-12-28 MED ORDER — PRAVASTATIN SODIUM 80 MG PO TABS: 80.0000 mg | ORAL_TABLET | Freq: Every evening | ORAL | 3 refills | Status: DC

## 2016-12-28 NOTE — Patient Instructions (Signed)
Medication Instructions:  Start Zetia 10 mg a day. Continue all other medications as listed.  Labwork: Please have fasting Lipid profile after you have been on Zetia for 10 weeks. (around 03/15/17)  Please don't eat or drink after midnight before having this blood work.  You may have it drawn at [facility].  Follow-Up: Follow up in 6 months with Dr. Percival Spanish.  You will receive a letter in the mail 2 months before you are due.  Please call us when you receive this letter to schedule your follow up appointment.  If you need a refill on your cardiac medications before your next appointment, please call your pharmacy.  Thank you for choosing [facility]!!

## 2017-03-14 DIAGNOSIS — N486 Induration penis plastica: Secondary | ICD-10-CM | POA: Diagnosis not present

## 2017-03-14 DIAGNOSIS — N281 Cyst of kidney, acquired: Secondary | ICD-10-CM | POA: Diagnosis not present

## 2017-03-14 DIAGNOSIS — C61 Malignant neoplasm of prostate: Secondary | ICD-10-CM | POA: Diagnosis not present

## 2017-03-14 DIAGNOSIS — N393 Stress incontinence (female) (male): Secondary | ICD-10-CM | POA: Diagnosis not present

## 2017-04-05 ENCOUNTER — Telehealth: Payer: Self-pay | Admitting: Cardiology

## 2017-04-05 NOTE — Telephone Encounter (Signed)
Returned call to pt he states that for the last 2 months he has had fatigue, exertional SOB and weakness in "his arms and legs" . He states that he has prostate CA surgery in May. He states that he will be planning radiation in the next couple months, he just wanted to let Dr Percival Spanish know.He states that he recovered from that but a couple months ago started "feeling bad" again. He states that the weak and fatigue is not normal for him. He states that "he just thinks that he needs to see Dr Percival Spanish". Made appt for next week and he states that he will "get the BP app on his phone and monitor it there he states that he will go to the ER if needed. Informed pt that he should go if BP is high or if worsening sx. He states that he will.

## 2017-04-05 NOTE — Telephone Encounter (Signed)
New message  Patient concerned about weakness. Please call   Pt c/o Shortness Of Breath: STAT if SOB developed within the last 24 hours or pt is noticeably SOB on the phone  1. Are you currently SOB (can you hear that pt is SOB on the phone)? Patient states   2. How long have you been experiencing SOB? At least 2 months  3. Are you SOB when sitting or when up moving around? Sitting and moving around  4. Are you currently experiencing any other symptoms? Weakness in arms and legs

## 2017-04-07 NOTE — Progress Notes (Signed)
Cardiology Office Note   Date:  04/12/2017   ID:  LOT MEDFORD, DOB 1945-04-19, MRN [id number]  PCP:  Orpah Melter, MD  Cardiologist:  Minus Breeding, MD Chief Complaint  Patient presents with  . Follow-up    sob, weakness, stress, stopped taking Zetia and Pravastatin a few days ago, due to sob and leg pain  . Shortness of Breath     History of Present Illness: Trevor Branch is a 71 y.o. male who presents for ongoing assessment and management of coronary artery disease, history of non-ST elevation MI in 2017.  Cardiac catheterization at that time revealed an 80% OM1 stenosis 99% mid LAD stenosis, requiring PCI and drug-eluting stent x2 to the mid LAD.  He also had staged PCI of the OM 1 with drug-eluting stent in March 2017.Other history includes ischemic cardiomyopathy with an EF of 35% to 40%, anterior septal, inferior septal, and apical wall motion abnormalities.  He was not started on beta-blockers due to bradycardia, he was also not placed on ACE inhibitor at that time due to hypotension.  The patient was allergic to statins and is currently on Zetia and pravastatin which she is tolerating.  On last office visit with Dr. Percival Spanish on 12/28/2016, the patient was stable from a cardiac standpoint with no new symptoms.  He was continued on his current medication regimen with secondary prevention.  The patient was scheduled for lipid profile.  This revealed a total cholesterol of 174, HDL of 47, LDL of 105, triglycerides 109.  The patient called our office on 04/05/2017 with complaints of worsening fatigue, exertional dyspnea, and weakness in his arms and legs.  Recently been diagnosed with prostate cancer in May 2018 and was in for radiation treatment.  Due to new complaints, he requested an appointment to be seen.  He has stopped taking Zetia now because of myalgias.  His main complaint is dyspnea as described.  He states the dyspnea was his anginal equivalent, before he knew that he had heart  disease.  Past Medical History:  Diagnosis Date  . Anxiety   . Arthritis    shoulder   . CAD (coronary artery disease)    a. NSTEMI 3/17 - s/p DES x 2 to mid LAD; staged PCI with DES to OM1; residual CAD - mLCX 40%, pRCA 30%, mRCA 20%   . Cancer ([place])    skin cancer and prostate cancer   . Complication of anesthesia    stopped breathing during surgery age 28 , 2009- SVT ablation - quit breathinig per patient anesthesia had to be reversed per patient   . Depression   . GERD (gastroesophageal reflux disease)   . Glaucoma   . Headache    history of migraines   . History of kidney stones   . History of non-ST elevation myocardial infarction (NSTEMI) 06/2015  . Hyperlipidemia   . Ischemic cardiomyopathy    a. Echo 06/23/15 - EF 35-40%, ant-septal, inferoseptal, apical AK, Gr 1 DD, trivial AI >>  b. Echo 5/17 - EF 55-60%, no RWMA, Gr 1 DD  . Myocardial infarction ([place])   . Panic disorder   . PTSD (post-traumatic stress disorder)    panic attacks  . SVT (supraventricular tachycardia) (HCC)    s/p prior RFCA    Past Surgical History:  Procedure Laterality Date  . CARDIAC CATHETERIZATION [name] 06/22/2015   Procedure: Left Heart Cath and Coronary Angiography;  Surgeon: Jettie Booze, MD;  Location: [place] [facility];  Service:  Cardiovascular;  Laterality: [name];  . CARDIAC CATHETERIZATION [name] 06/22/2015   Procedure: Coronary Stent Intervention;  Surgeon: Jettie Booze, MD;  Location: [place] [facility];  Service: Cardiovascular;  Laterality: [name];  . CARDIAC CATHETERIZATION [name] 06/24/2015   Procedure: Left Heart Cath and Coronary Angiography;  Surgeon: Troy Sine, MD;  Location: [place] [facility];  Service: Cardiovascular;  Laterality: [name];  . CARDIAC CATHETERIZATION [name] 06/24/2015   Procedure: Coronary Stent Intervention;  Surgeon: Troy Sine, MD;  Location: [place] [facility];  Service: Cardiovascular;  Laterality: [name];  . coronary stents     . kidney stent     .  LITHOTRIPSY     x 3  . LYMPHADENECTOMY Bilateral 08/31/2016   Procedure: PELVIC LYMPHADENECTOMY;  Surgeon: Alexis Frock, MD;  Location: [facility];  Service: Urology;  Laterality: Bilateral;  . ROBOT ASSISTED LAPAROSCOPIC RADICAL PROSTATECTOMY [name] 08/31/2016   Procedure: XI ROBOTIC ASSISTED LAPAROSCOPIC RADICAL PROSTATECTOMY WITH INDOCYANINE GREEN DYE;  Surgeon: Alexis Frock, MD;  Location: [facility];  Service: Urology;  Laterality: [name];  . SUPRAVENTRICULAR TACHYCARDIA ABLATION  2009   at [facility]  . TONSILLECTOMY       Current Outpatient Medications  Medication Sig Dispense Refill  . acetaminophen (TYLENOL) 500 MG tablet Take 1,000 mg by mouth daily as needed for moderate pain.    Marland Kitchen ALPRAZolam (XANAX) 0.5 MG tablet Take 0.5 mg by mouth as needed for anxiety.    Marland Kitchen aspirin EC 81 MG tablet Take 1 tablet (81 mg total) by mouth daily. (Patient taking differently: Take 81 mg by mouth every evening. ) 90 tablet 3  . aspirin-acetaminophen-caffeine (EXCEDRIN MIGRAINE) 250-250-65 MG tablet Take 2 tablets by mouth daily as needed for headache or migraine.    . cycloSPORINE (RESTASIS) 0.05 % ophthalmic emulsion Place 1 drop into both eyes 2 (two) times daily.    . famotidine (PEPCID) 20 MG tablet Take 20 mg by mouth 3 (three) times daily with meals.    . latanoprost (XALATAN) 0.005 % ophthalmic solution Place 1 drop into both eyes at bedtime.     . Polyvinyl Alcohol-Povidone (REFRESH OP) Apply 1 drop to eye daily as needed (dry eyes).    . timolol (TIMOPTIC) 0.5 % ophthalmic solution Place 1 drop into both eyes daily.     No current facility-administered medications for this visit.     Allergies:   Other; Adhesive [tape]; Atorvastatin; Doxycycline; Mucinex [guaifenesin er]; Rosuvastatin; Versed [midazolam]; and Imitrex [sumatriptan]    Social History:  The patient  reports that  has never smoked. he has never used smokeless tobacco. He reports that he does not drink alcohol or use drugs.   Family  History:  The patient's family history includes CAD in his mother; CAD (age of onset: 44) in his brother; CAD (age of onset: 5) in his father; Hypertension in his father; Urinary tract infection (age of onset: 72) in his mother.    ROS: All other systems are reviewed and negative. Unless otherwise mentioned in H&P    PHYSICAL EXAM: VS:  BP 132/72   Pulse (!) 58   Ht 6\' 1"  (1.854 m)   Wt 201 lb 6.4 oz (91.4 kg)   BMI 26.57 kg/m  , BMI Body mass index is 26.57 kg/m. GEN: Well nourished, well developed, in no acute distress  HEENT: normal  Neck: no JVD, carotid bruits, or masses Cardiac: RRR; no murmurs, rubs, or gallops,no edema  Respiratory:  clear to auscultation bilaterally, normal work of  breathing GI: soft, nontender, nondistended, + BS MS: no deformity or atrophy  Skin: warm and dry, no rash Neuro:  Strength and sensation are intact Psych: euthymic mood, full affect  Recent Labs: 06/15/2016: ALT 16; B Natriuretic Peptide 19.6; TSH 1.074 08/26/2016: Platelets 186 09/01/2016: BUN 12; Creatinine, Ser 1.13; Hemoglobin 11.3; Potassium 5.2; Sodium 138    Lipid Panel    Component Value Date/Time   CHOL 174 12/16/2016 0000   TRIG 109 12/16/2016 0000   HDL 47 12/16/2016 0000   CHOLHDL 3.7 12/16/2016 0000   CHOLHDL 4.4 06/20/2015 0658   VLDL 14 06/20/2015 0658   LDLCALC 105 (H) 12/16/2016 0000   LDLDIRECT 173.8 10/03/2007 0721      Wt Readings from Last 3 Encounters:  04/12/17 201 lb 6.4 oz (91.4 kg)  12/28/16 192 lb (87.1 kg)  08/31/16 190 lb (86.2 kg)      Other studies Reviewed: Echocardiogram 2016/07/16 Left ventricle: The cavity size was normal. There was mild focal   basal hypertrophy of the septum. Systolic function was normal.   The estimated ejection fraction was in the range of 55% to 60%.   Wall motion was normal; there were no regional wall motion   abnormalities. Doppler parameters are consistent with abnormal   left ventricular relaxation (grade 1  diastolic dysfunction). - Ventricular septum: Septal motion showed abnormal function and   dyssynergy. - Aortic valve: There was trivial regurgitation. - Aortic root: The aortic root was mildly dilated.  Impressions:  - Normal LV systolic function; grade 1 diastolic dysfunction; trace   AI; mildly dilated aortic root.   ASSESSMENT AND PLAN:  1.  Coronary artery disease: Patient with known history of stents to LAD, and OM1.  Repeat echocardiogram in March of this year revealed normal LV systolic function.  He denies complaints of chest pain but is having worsening dyspnea on exertion which is similar to his complaint prior to interventions in 2017.  As a result of his symptoms I am going to plan a exercise Myoview for evaluation of worsening coronary artery disease, or new areas of ischemia.  He is not on ACE inhibitor or beta-blocker at this time.  2.  Dyspnea: No history of lung disease or smoking.  Usually occurring with exertion but worsening.  Stress test as above.  3.  Hypercholesterolemia: We will consider referral to lipid clinic as he is intolerant to Crestor and Pravachol, along with Zetia.  This will be discussed on next appointment stress test.  Current medicines are reviewed at length with the patient today.    Labs/ tests ordered today include: Nuclear medicine stress test Phill Myron. West Pugh, ANP, AACC   04/12/2017 10:33 AM    [facility] [address], [place], [place] [address] Phone: [phone]; Fax: [phone]

## 2017-04-12 ENCOUNTER — Encounter: Payer: Self-pay | Admitting: Adult Health

## 2017-04-12 ENCOUNTER — Ambulatory Visit (INDEPENDENT_AMBULATORY_CARE_PROVIDER_SITE_OTHER): Payer: PPO | Admitting: Adult Health

## 2017-04-12 VITALS — BP 132/72 | HR 58 | Ht 73.0 in | Wt 201.4 lb

## 2017-04-12 DIAGNOSIS — R0609 Other forms of dyspnea: Secondary | ICD-10-CM

## 2017-04-12 DIAGNOSIS — E7800 Pure hypercholesterolemia, unspecified: Secondary | ICD-10-CM

## 2017-04-12 DIAGNOSIS — I2510 Atherosclerotic heart disease of native coronary artery without angina pectoris: Secondary | ICD-10-CM

## 2017-04-12 NOTE — Patient Instructions (Addendum)
Medication Instructions:  NO CHANGES-Your physician recommends that you continue on your current medications as directed. Please refer to the Current Medication list given to you today.  If you need a refill on your cardiac medications before your next appointment, please call your pharmacy.  Testing/Procedures: Your physician has requested that you have a EXERCISE STRESS TEST (lexiscan myoview) is a test that measures the heart's ability to respond to  stress response is induced by exercise For further information please visit [facility]. Please follow instruction sheet, as given.  Follow-Up: Your physician wants you to follow-up in: AFTER STRESS TESTING-WE WILL CALL YOU WITH THE RESULT.   Thank you for choosing [facility]!!

## 2017-04-13 ENCOUNTER — Ambulatory Visit (HOSPITAL_COMMUNITY)
Admission: RE | Admit: 2017-04-13 | Discharge: 2017-04-13 | Disposition: A | Discharge status: Home / Self Care | Payer: PPO | Source: Ambulatory Visit | Attending: Cardiovascular Disease | Admitting: Cardiovascular Disease

## 2017-04-13 DIAGNOSIS — I2510 Atherosclerotic heart disease of native coronary artery without angina pectoris: Secondary | ICD-10-CM | POA: Insufficient documentation

## 2017-04-13 DIAGNOSIS — R0609 Other forms of dyspnea: Secondary | ICD-10-CM | POA: Diagnosis not present

## 2017-04-13 LAB — MYOCARDIAL PERFUSION IMAGING
CHL CUP RESTING HR STRESS: 51 {beats}/min
CHL RATE OF PERCEIVED EXERTION: 18
CSEPED: 8 min
CSEPHR: 98 %
Estimated workload: 10.1 METS
Exercise duration (sec): 1 s
LV dias vol: 115 mL (ref 62–150)
LV sys vol: 54 mL
MPHR: 148 {beats}/min
Peak HR: 146 {beats}/min
SDS: 0
SRS: 4
SSS: 4
TID: 0.97

## 2017-04-13 MED ORDER — TECHNETIUM TC 99M TETROFOSMIN IV KIT
31.5000 | PACK | Freq: Once | INTRAVENOUS | Status: AC | PRN
  Administered 2017-04-13: 31.5 via INTRAVENOUS
  Filled 2017-04-13: qty 32

## 2017-04-13 MED ORDER — TECHNETIUM TC 99M TETROFOSMIN IV KIT
10.2000 | PACK | Freq: Once | INTRAVENOUS | Status: AC | PRN
  Administered 2017-04-13: 10.2 via INTRAVENOUS
  Filled 2017-04-13: qty 11

## 2017-05-10 DIAGNOSIS — D485 Neoplasm of uncertain behavior of skin: Secondary | ICD-10-CM | POA: Diagnosis not present

## 2017-05-10 DIAGNOSIS — L821 Other seborrheic keratosis: Secondary | ICD-10-CM | POA: Diagnosis not present

## 2017-05-10 DIAGNOSIS — Z85828 Personal history of other malignant neoplasm of skin: Secondary | ICD-10-CM | POA: Diagnosis not present

## 2017-05-10 DIAGNOSIS — L820 Inflamed seborrheic keratosis: Secondary | ICD-10-CM | POA: Diagnosis not present

## 2017-05-10 DIAGNOSIS — D225 Melanocytic nevi of trunk: Secondary | ICD-10-CM | POA: Diagnosis not present

## 2017-05-17 ENCOUNTER — Ambulatory Visit: Payer: PPO | Admitting: Cardiology

## 2017-06-21 ENCOUNTER — Telehealth: Payer: Self-pay | Admitting: Cardiology

## 2017-06-21 NOTE — Telephone Encounter (Signed)
Called and Spoke w/ Trevor Branch to reschedule appointment and patient stated that we reschedule him from the last appt and we are pushing his appointment out even further and hung up the phone .

## 2017-07-12 ENCOUNTER — Ambulatory Visit: Payer: PPO | Admitting: Cardiology

## 2017-07-14 DIAGNOSIS — Z1211 Encounter for screening for malignant neoplasm of colon: Secondary | ICD-10-CM | POA: Diagnosis not present

## 2017-07-14 DIAGNOSIS — C61 Malignant neoplasm of prostate: Secondary | ICD-10-CM | POA: Diagnosis not present

## 2017-07-14 DIAGNOSIS — K219 Gastro-esophageal reflux disease without esophagitis: Secondary | ICD-10-CM | POA: Diagnosis not present

## 2017-07-14 DIAGNOSIS — I2510 Atherosclerotic heart disease of native coronary artery without angina pectoris: Secondary | ICD-10-CM | POA: Diagnosis not present

## 2017-07-14 DIAGNOSIS — E785 Hyperlipidemia, unspecified: Secondary | ICD-10-CM | POA: Diagnosis not present

## 2017-07-14 DIAGNOSIS — Z0000 Encounter for general adult medical examination without abnormal findings: Secondary | ICD-10-CM | POA: Diagnosis not present

## 2017-07-20 DIAGNOSIS — N281 Cyst of kidney, acquired: Secondary | ICD-10-CM | POA: Diagnosis not present

## 2017-07-20 DIAGNOSIS — N393 Stress incontinence (female) (male): Secondary | ICD-10-CM | POA: Diagnosis not present

## 2017-07-20 DIAGNOSIS — N5201 Erectile dysfunction due to arterial insufficiency: Secondary | ICD-10-CM | POA: Diagnosis not present

## 2017-07-20 DIAGNOSIS — C61 Malignant neoplasm of prostate: Secondary | ICD-10-CM | POA: Diagnosis not present

## 2017-07-20 DIAGNOSIS — N486 Induration penis plastica: Secondary | ICD-10-CM | POA: Diagnosis not present

## 2017-08-27 DIAGNOSIS — R05 Cough: Secondary | ICD-10-CM | POA: Diagnosis not present

## 2017-08-27 DIAGNOSIS — J069 Acute upper respiratory infection, unspecified: Secondary | ICD-10-CM | POA: Diagnosis not present

## 2017-10-23 NOTE — Progress Notes (Signed)
Cardiology Office Note   Date:  10/25/2017   ID:  Trevor Branch, DOB 12-Mar-1946, MRN [id number]  PCP:  Orpah Melter, MD  Cardiologist:   Minus Breeding, MD   Chief Complaint  Patient presents with  . Coronary Artery Disease     History of Present Illness: Trevor Branch is a 72 y.o. male who presents for follow up of CAD.  He had  NSTEM in 2017. LHC demonstrated OM1 80%, mid LAD 99%. He underwent PCI with DES x 2 to the mLAD. Echo confirmed ischemic CM with EF 35-40% with ant-septal, inf-septal and apical wall motion abnormalities. He returned to the Cath Lab 3/15 and underwent staged PCI of the OM1 with a Synergy DES. LVEDP 17-19 mmHg. No beta-blocker was started due to bradycardia. He was not put on ACE inhibitor due to low BP. He is allergic to statins and was put on Zetia. 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  He had a follow up echo in May that demonstrated a low normal EF without mention of effusion.   The patient had a low risk nuclear study in Jan of this year after complaining of dyspnea.    Since I last saw him he has had problems with sinus drainage.  He was taking Motrin because he was having a lot of chest pain related to coughing from the sinus drainage.  He did see for some reason a cardiologist at the [facility] referred by the primary provider there.  I do not have these records.  He was told to take Plavix which he actually did not start because he was already taking aspirin.  He is continuing to have problems with sinus drainage.  He is having trouble swallowing the very large pravastatin which he agreed to start taking.  He denies any ongoing chest pressure, neck or arm discomfort similar to previous angina.  He is not having any new palpitations, presyncope or syncope.  He is doing a lot of activities climbing mountains and doing other things without limitations.  Past Medical History:  Diagnosis Date    . Anxiety   . Arthritis    shoulder   . CAD (coronary artery disease)    a. NSTEMI 3/17 - s/p DES x 2 to mid LAD; staged PCI with DES to OM1; residual CAD - mLCX 40%, pRCA 30%, mRCA 20%   . Cancer ([place])    skin cancer and prostate cancer   . Complication of anesthesia    stopped breathing during surgery age 47 , 2009- SVT ablation - quit breathinig per patient anesthesia had to be reversed per patient   . Depression   . GERD (gastroesophageal reflux disease)   . Glaucoma   . Headache    history of migraines   . History of kidney stones   . History of non-ST elevation myocardial infarction (NSTEMI) 06/2015  . Hyperlipidemia   . Ischemic cardiomyopathy    a. Echo 06/23/15 - EF 35-40%, ant-septal, inferoseptal, apical AK, Gr 1 DD, trivial AI >>  b. Echo 5/17 - EF 55-60%, no RWMA, Gr 1 DD  . Myocardial infarction ([place])   . Panic disorder   . PTSD (post-traumatic stress disorder)    panic attacks  . SVT (supraventricular tachycardia) (HCC)    s/p prior RFCA    Past Surgical History:  Procedure Laterality Date  . CARDIAC CATHETERIZATION [name] 06/22/2015   Procedure: Left Heart Cath and  Coronary Angiography;  Surgeon: Jettie Booze, MD;  Location: [place] [facility];  Service: Cardiovascular;  Laterality: [name];  . CARDIAC CATHETERIZATION [name] 06/22/2015   Procedure: Coronary Stent Intervention;  Surgeon: Jettie Booze, MD;  Location: [place] [facility];  Service: Cardiovascular;  Laterality: [name];  . CARDIAC CATHETERIZATION [name] 06/24/2015   Procedure: Left Heart Cath and Coronary Angiography;  Surgeon: Troy Sine, MD;  Location: [place] [facility];  Service: Cardiovascular;  Laterality: [name];  . CARDIAC CATHETERIZATION [name] 06/24/2015   Procedure: Coronary Stent Intervention;  Surgeon: Troy Sine, MD;  Location: [place] [facility];  Service: Cardiovascular;  Laterality: [name];  . coronary stents     . kidney stent     . LITHOTRIPSY     x 3  . LYMPHADENECTOMY Bilateral  08/31/2016   Procedure: PELVIC LYMPHADENECTOMY;  Surgeon: Alexis Frock, MD;  Location: [facility];  Service: Urology;  Laterality: Bilateral;  . ROBOT ASSISTED LAPAROSCOPIC RADICAL PROSTATECTOMY [name] 08/31/2016   Procedure: XI ROBOTIC ASSISTED LAPAROSCOPIC RADICAL PROSTATECTOMY WITH INDOCYANINE GREEN DYE;  Surgeon: Alexis Frock, MD;  Location: [facility];  Service: Urology;  Laterality: [name];  . SUPRAVENTRICULAR TACHYCARDIA ABLATION  2009   at [facility]  . TONSILLECTOMY       Current Outpatient Medications  Medication Sig Dispense Refill  . acetaminophen (TYLENOL) 500 MG tablet Take 1,000 mg by mouth daily as needed for moderate pain.    Marland Kitchen ALPRAZolam (XANAX) 0.5 MG tablet Take 0.5 mg by mouth as needed for anxiety.    Marland Kitchen aspirin EC 81 MG tablet Take 1 tablet (81 mg total) by mouth daily. (Patient taking differently: Take 81 mg by mouth every evening. ) 90 tablet 3  . aspirin-acetaminophen-caffeine (EXCEDRIN MIGRAINE) 250-250-65 MG tablet Take 2 tablets by mouth daily as needed for headache or migraine.    . cycloSPORINE (RESTASIS) 0.05 % ophthalmic emulsion Place 1 drop into both eyes 2 (two) times daily.    . famotidine (PEPCID) 20 MG tablet Take 20 mg by mouth 3 (three) times daily with meals.    . latanoprost (XALATAN) 0.005 % ophthalmic solution Place 1 drop into both eyes at bedtime.     . Polyvinyl Alcohol-Povidone (REFRESH OP) Apply 1 drop to eye daily as needed (dry eyes).    . timolol (TIMOPTIC) 0.5 % ophthalmic solution Place 1 drop into both eyes daily.     No current facility-administered medications for this visit.     Allergies:   Other; Adhesive [tape]; Atorvastatin; Doxycycline; Mucinex [guaifenesin er]; Rosuvastatin; Versed [midazolam]; and Imitrex [sumatriptan]    ROS:  Please see the history of present illness.   Otherwise, review of systems are positive for cough ears feel stuffed up, drainage.   All other systems are reviewed and negative.    PHYSICAL EXAM: VS:  BP 122/78    Pulse 68   Ht 6\' 1"  (1.854 m)   Wt 195 lb (88.5 kg)   BMI 25.73 kg/m  , BMI Body mass index is 25.73 kg/m.  GENERAL:  Well appearing NECK:  No jugular venous distention, waveform within normal limits, carotid upstroke brisk and symmetric, no bruits, no thyromegaly LUNGS:  Clear to auscultation bilaterally CHEST:  Unremarkable HEART:  PMI not displaced or sustained,S1 and S2 within normal limits, no S3, no S4, no clicks, no rubs, no murmurs ABD:  Flat, positive bowel sounds normal in frequency in pitch, no bruits, no rebound, no guarding, no midline pulsatile mass, no hepatomegaly, no splenomegaly EXT:  2 plus pulses throughout, no edema, no cyanosis no clubbing   EKG:  EKG is not ordered today.    Recent Labs: No results found for requested labs within last 8760 hours.    Lipid Panel    Component Value Date/Time   CHOL 174 12/16/2016 0000   TRIG 109 12/16/2016 0000   HDL 47 12/16/2016 0000   CHOLHDL 3.7 12/16/2016 0000   CHOLHDL 4.4 06/20/2015 0658   VLDL 14 06/20/2015 0658   LDLCALC 105 (H) 12/16/2016 0000   LDLDIRECT 173.8 10/03/2007 0721      Wt Readings from Last 3 Encounters:  10/25/17 195 lb (88.5 kg)  04/13/17 201 lb (91.2 kg)  04/12/17 201 lb 6.4 oz (91.4 kg)      Other studies Reviewed: Additional studies/ records that were reviewed today include: Lexiscan Myoview. Review of the above records demonstrates:       ASSESSMENT AND PLAN:   CAD -    The patient has no new sypmtoms.  No further cardiovascular testing is indicated.  We will continue with aggressive risk reduction and meds as listed.  I do not see a reason for DAPT.   CKD Stage II:   His last creat was 1.13.  No change in therapy.  Ischemic CM -   EF was improved.  No change in therapy.     HL -      He is having trouble swallowing the current dose of pravastatin but it is because it is not coated and we can get him at different formulation but he will have to pay out-of-pocket for it for go  to his regular insurance.  I have asked him to get his lipids checked when he sees his primary provider.  I would like to review these labs.  Current medicines are reviewed at length with the patient today.  The patient does not have concerns regarding medicines.  The following changes have been made: None  Labs/ tests ordered today include:   None  No orders of the defined types were placed in this encounter.    Disposition:   FU with me in 6 months.    Signed, Minus Breeding, MD  10/25/2017 3:34 PM    [facility]

## 2017-10-25 ENCOUNTER — Encounter: Payer: Self-pay | Admitting: Cardiology

## 2017-10-25 ENCOUNTER — Ambulatory Visit (INDEPENDENT_AMBULATORY_CARE_PROVIDER_SITE_OTHER): Payer: PPO | Admitting: Cardiology

## 2017-10-25 VITALS — BP 122/78 | HR 68 | Ht 73.0 in | Wt 195.0 lb

## 2017-10-25 DIAGNOSIS — E785 Hyperlipidemia, unspecified: Secondary | ICD-10-CM | POA: Diagnosis not present

## 2017-10-25 DIAGNOSIS — I2510 Atherosclerotic heart disease of native coronary artery without angina pectoris: Secondary | ICD-10-CM | POA: Diagnosis not present

## 2017-10-25 NOTE — Patient Instructions (Signed)
Medication Instructions:  The current medical regimen is effective;  continue present plan and medications.  Follow-Up: Follow up in 6 months with Dr. Warren Lacy.  You will receive a letter in the mail 2 months before you are due.  Please call us when you receive this letter to schedule your follow up appointment.  If you need a refill on your cardiac medications before your next appointment, please call your pharmacy.  Thank you for choosing [facility]!!

## 2017-11-07 DIAGNOSIS — Z85828 Personal history of other malignant neoplasm of skin: Secondary | ICD-10-CM | POA: Diagnosis not present

## 2017-11-07 DIAGNOSIS — L570 Actinic keratosis: Secondary | ICD-10-CM | POA: Diagnosis not present

## 2017-11-07 DIAGNOSIS — L820 Inflamed seborrheic keratosis: Secondary | ICD-10-CM | POA: Diagnosis not present

## 2017-11-07 DIAGNOSIS — L821 Other seborrheic keratosis: Secondary | ICD-10-CM | POA: Diagnosis not present

## 2017-12-24 DIAGNOSIS — L03116 Cellulitis of left lower limb: Secondary | ICD-10-CM | POA: Diagnosis not present

## 2018-01-14 IMAGING — CT CT CHEST W/ CM
2 of 5 series · 13 of 36 positions shown, 16 images · IV contrast (APPLIED)
Comparison: None available

CLINICAL DATA: Abdominal distention.

EXAM:
CT CHEST, ABDOMEN AND PELVIS WITHOUT CONTRAST
TECHNIQUE: Multidetector CT imaging of the chest, abdomen and pelvis was
performed following the standard protocol without IV contrast.

[Series 2: cap with 2 · axial · 0.81mm/px · z∈[-455,+175]mm · 10 of 146 slices shown, 13 images]
[im 10/146  mediastinal]
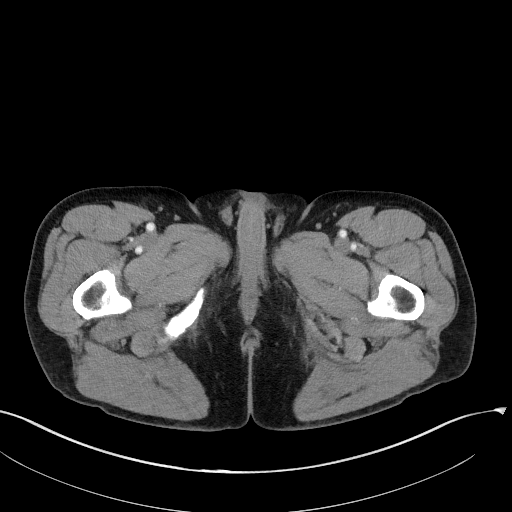
[im 10/146  lung]
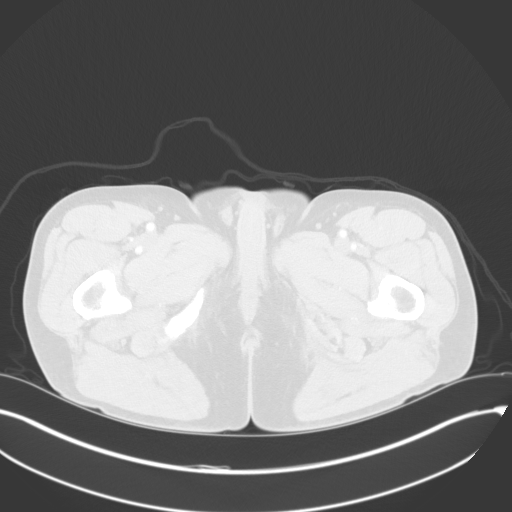
[im 30/146  lung]
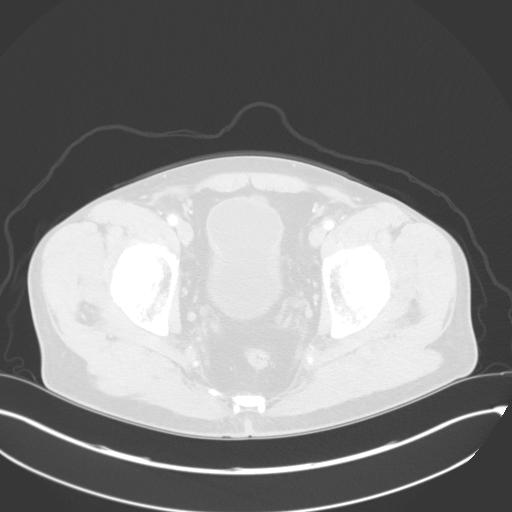
[im 39/146  lung]
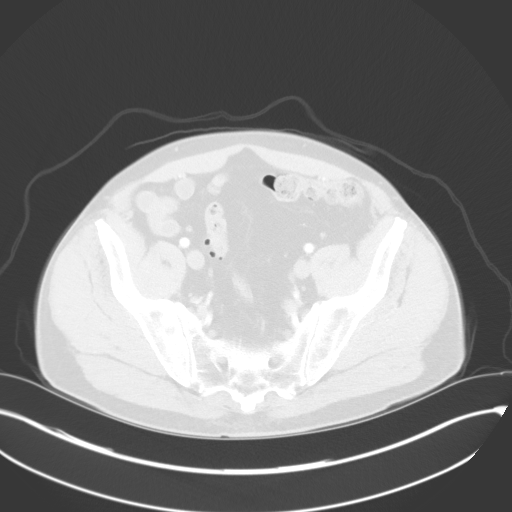
[im 49/146  lung]
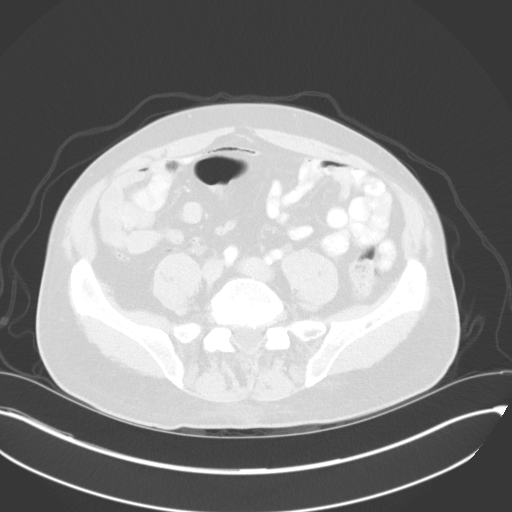
[im 68/146  mediastinal]
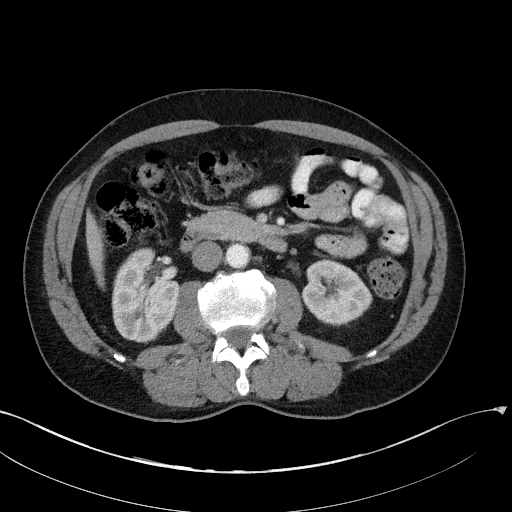
[im 68/146  lung]
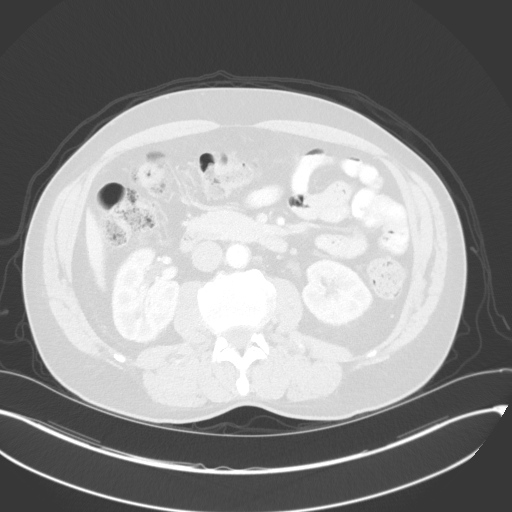
[im 78/146  lung]
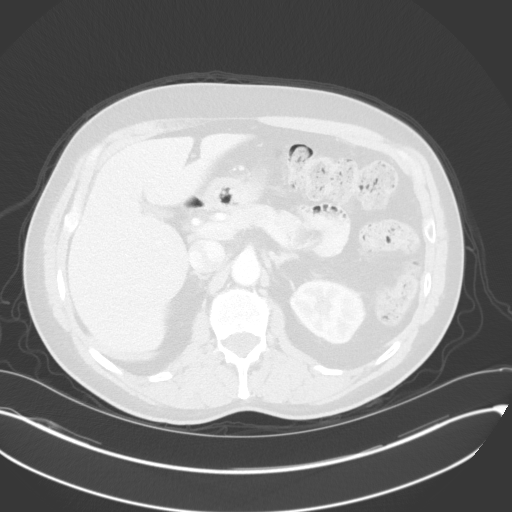
[im 97/146  lung]
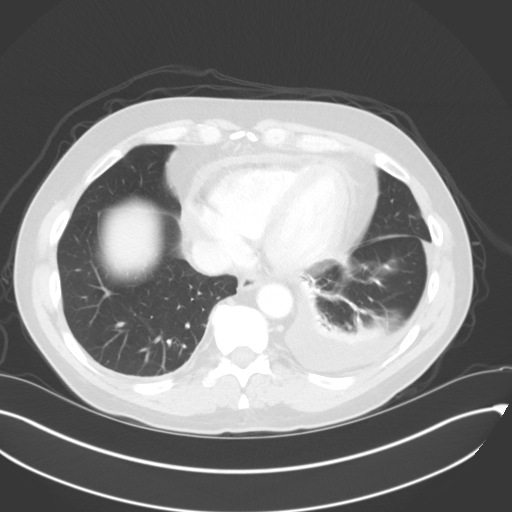
[im 107/146  lung]
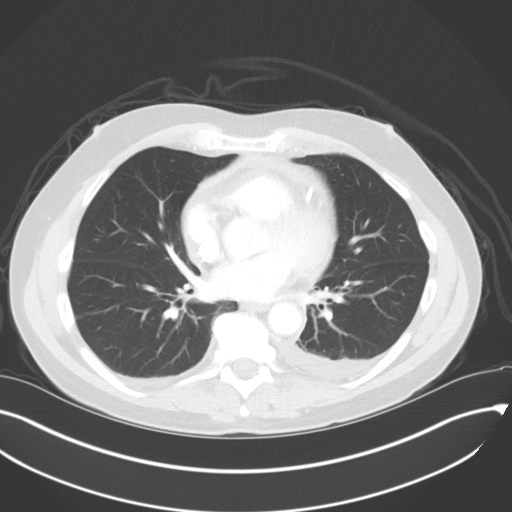
[im 117/146  mediastinal]
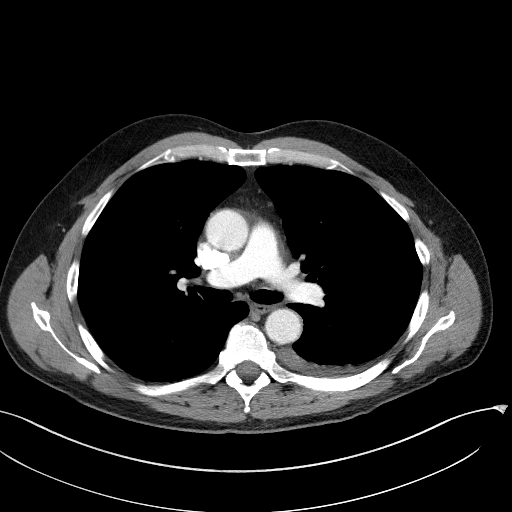
[im 117/146  lung]
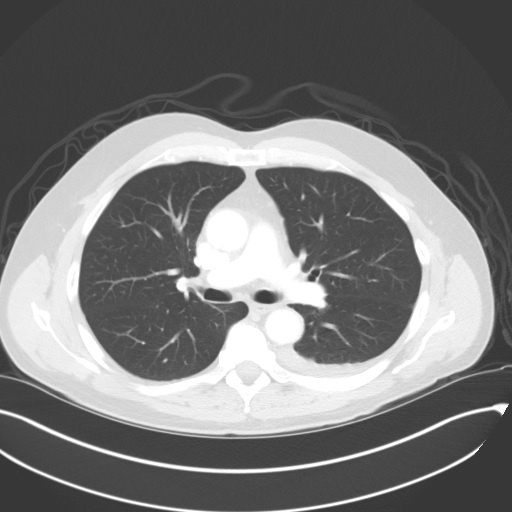
[im 136/146  lung]
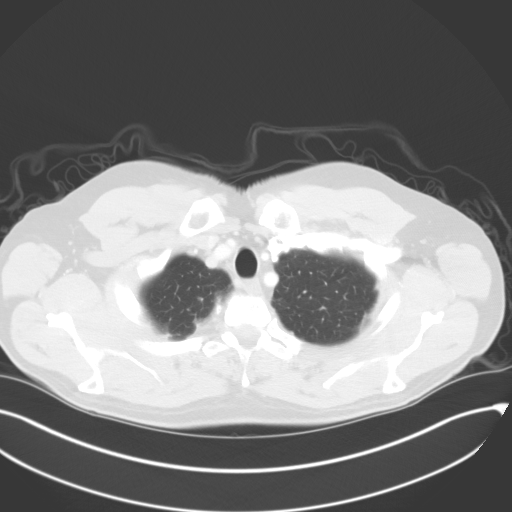

[Series 4: coronals · coronal · 0.76mm/px · 3 of 150 slices shown]
[im 30/150  lung]
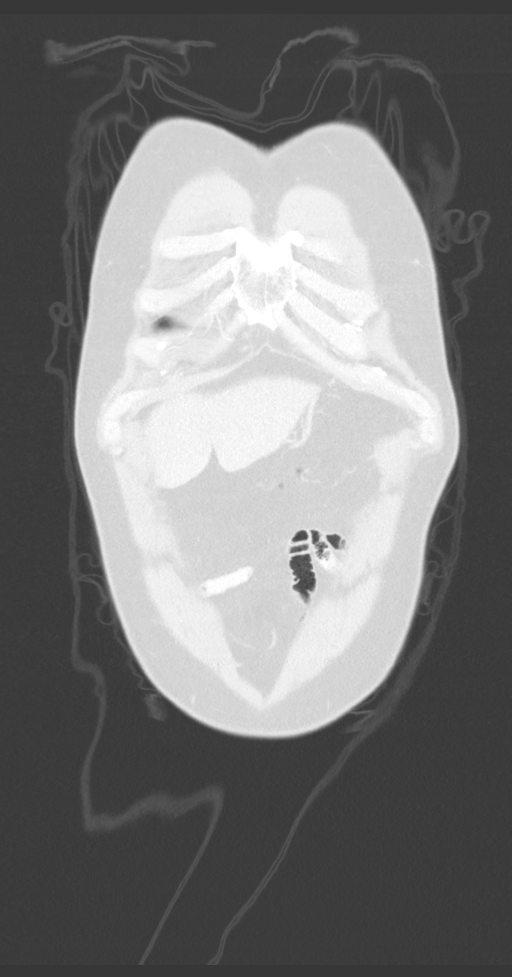
[im 60/150  lung]
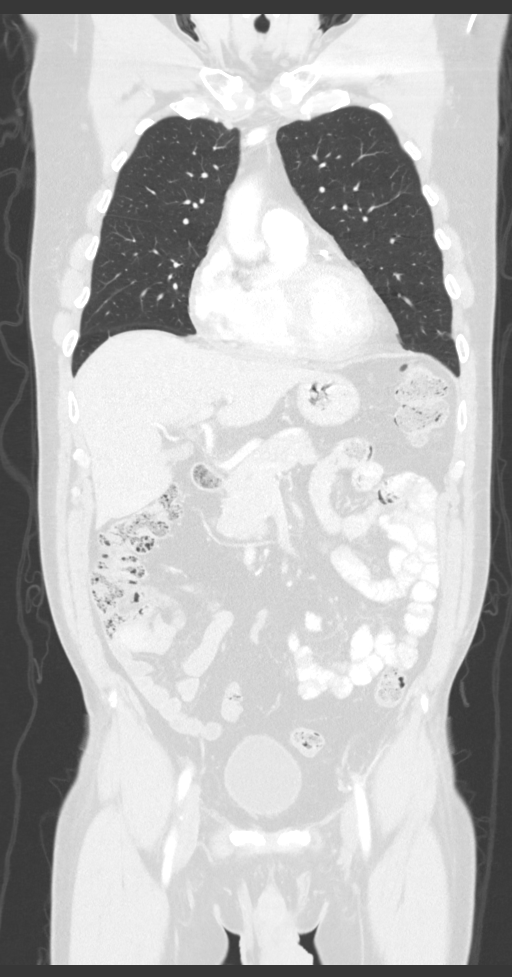
[im 90/150  lung]
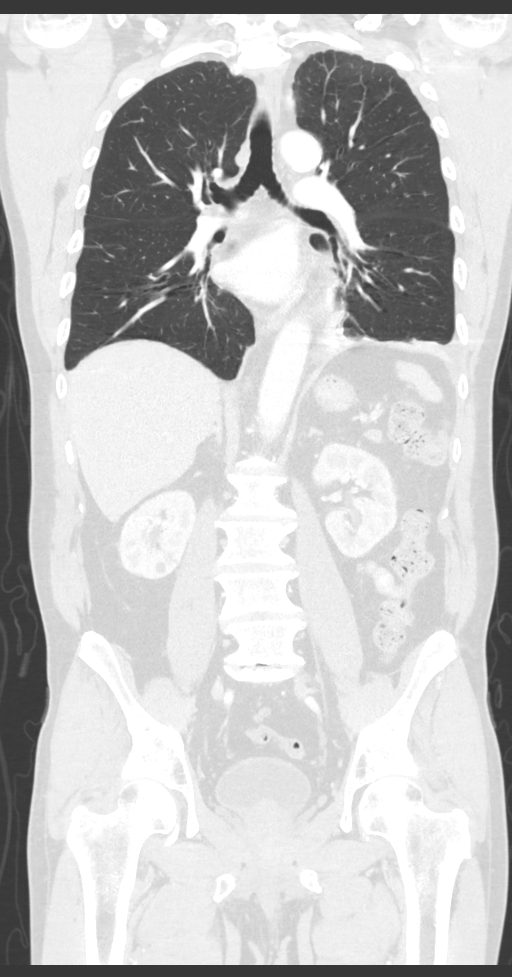

[13 of 36 positions shown; findings below may reference images not displayed]

FINDINGS: CT CHEST

THORACIC INLET/BODY WALL:

No acute abnormality.

MEDIASTINUM:

Normal heart size. There is a small pericardial effusion with
pericardial thickening and enhancement. Questioned Honian
syndrome given recent myocardial infarction. Left circulation
coronary stenting. No evidence of acute vascular disease.

LUNG WINDOWS:

Trace right and small left pleural effusions with mild atelectasis.
No pneumonia, edema, or air leak.

UPPER ABDOMEN:

No acute findings.

OSSEOUS:

No acute fracture.  No suspicious lytic or blastic lesions.

CT ABDOMEN AND PELVIS

Abdominal wall:  Fatty umbilical hernia

Hepatobiliary: No focal liver abnormality.No evidence of biliary
obstruction or stone.

Pancreas: Unremarkable.

Spleen: Unremarkable.

Adrenals/Urinary Tract: Negative adrenals. No hydronephrosis or
stone. Simple appearing right lower pole cyst measuring 16 mm. Thick
walled bladder with diverticulum and cellules compatible chronic
outlet obstruction.

Reproductive:Enlarged prostate with central gland projecting into
the bladder base.

Stomach/Bowel: No obstruction. No appendicitis. Colonic
diverticulosis.

Vascular/Lymphatic: No acute vascular abnormality. Left hepatic
artery replaced to the left gastric. No mass or adenopathy.

Peritoneal: No ascites or pneumoperitoneum.

Musculoskeletal: No acute abnormalities. Spondylosis and disc
degeneration.
IMPRESSION: 1. Pericarditis with small pericardial effusion. Given recent
myocardial infarction question Honian syndrome.
2. Trace right and small left pleural effusions.
3. No explanation for abdominal distention.
4. Prostatomegaly and chronic bladder outlet obstruction

## 2018-05-10 DIAGNOSIS — D485 Neoplasm of uncertain behavior of skin: Secondary | ICD-10-CM | POA: Diagnosis not present

## 2018-05-10 DIAGNOSIS — Z85828 Personal history of other malignant neoplasm of skin: Secondary | ICD-10-CM | POA: Diagnosis not present

## 2018-05-10 DIAGNOSIS — L570 Actinic keratosis: Secondary | ICD-10-CM | POA: Diagnosis not present

## 2018-05-10 DIAGNOSIS — C4441 Basal cell carcinoma of skin of scalp and neck: Secondary | ICD-10-CM | POA: Diagnosis not present

## 2018-05-10 DIAGNOSIS — L821 Other seborrheic keratosis: Secondary | ICD-10-CM | POA: Diagnosis not present

## 2018-05-10 DIAGNOSIS — C44519 Basal cell carcinoma of skin of other part of trunk: Secondary | ICD-10-CM | POA: Diagnosis not present

## 2018-08-08 ENCOUNTER — Telehealth: Payer: Self-pay | Admitting: Cardiology

## 2018-08-08 NOTE — Telephone Encounter (Signed)
Patient seeing his cardiologist at [facility]

## 2018-11-08 DIAGNOSIS — L814 Other melanin hyperpigmentation: Secondary | ICD-10-CM | POA: Diagnosis not present

## 2018-11-08 DIAGNOSIS — D485 Neoplasm of uncertain behavior of skin: Secondary | ICD-10-CM | POA: Diagnosis not present

## 2018-11-08 DIAGNOSIS — D1801 Hemangioma of skin and subcutaneous tissue: Secondary | ICD-10-CM | POA: Diagnosis not present

## 2018-11-08 DIAGNOSIS — C44519 Basal cell carcinoma of skin of other part of trunk: Secondary | ICD-10-CM | POA: Diagnosis not present

## 2018-11-08 DIAGNOSIS — L820 Inflamed seborrheic keratosis: Secondary | ICD-10-CM | POA: Diagnosis not present

## 2018-11-08 DIAGNOSIS — D235 Other benign neoplasm of skin of trunk: Secondary | ICD-10-CM | POA: Diagnosis not present

## 2018-11-08 DIAGNOSIS — L821 Other seborrheic keratosis: Secondary | ICD-10-CM | POA: Diagnosis not present

## 2018-11-08 DIAGNOSIS — Z85828 Personal history of other malignant neoplasm of skin: Secondary | ICD-10-CM | POA: Diagnosis not present

## 2018-12-27 DIAGNOSIS — R52 Pain, unspecified: Secondary | ICD-10-CM | POA: Diagnosis not present

## 2019-02-07 IMAGING — NM NM BONE WHOLE BODY
2 series · 2 of 2 positions shown · non-contrast
Comparison: CT abdomen 07/29/2016

CLINICAL DATA: Prostate carcinoma.  Rising PSA

EXAM:
NUCLEAR MEDICINE WHOLE BODY BONE SCAN
TECHNIQUE: Whole body anterior and posterior images were obtained approximately
3 hours after intravenous injection of radiopharmaceutical.
RADIOPHARMACEUTICALS:  21.7 millicuries mCi Oechnetium-HHm MDP IV

[Series 1: whole body · 2.66mm/px · 1 of 1 slices shown (1 of 2)]
[im 1/1]
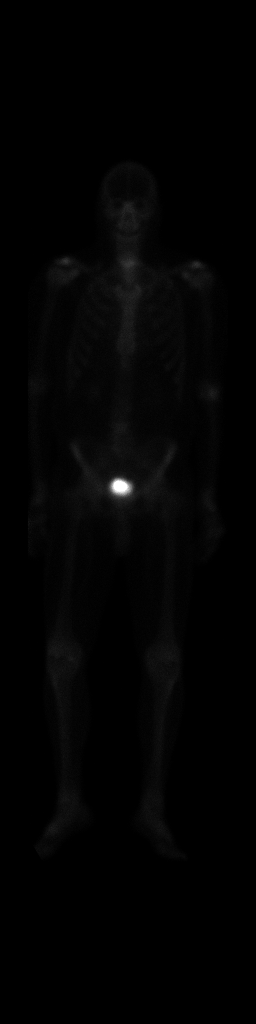

[Series 1: whole body · 2.66mm/px · 1 of 1 slices shown (2 of 2)]
[im 1/1]
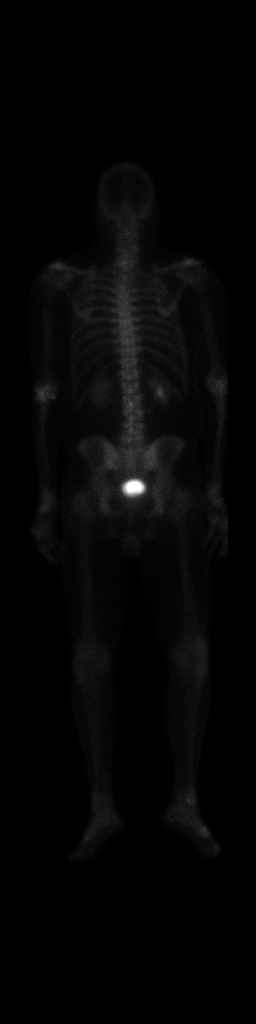

[2 of 2 positions shown; findings below may reference images not displayed]

FINDINGS: No abnormal radiotracer accumulation within the axillary or
appendicular skeleton to localize prostate cancer metastasis.
IMPRESSION: No evidence of prostate cancer metastasis within the skeleton

## 2019-02-18 DIAGNOSIS — D224 Melanocytic nevi of scalp and neck: Secondary | ICD-10-CM | POA: Diagnosis not present

## 2019-02-18 DIAGNOSIS — Z85828 Personal history of other malignant neoplasm of skin: Secondary | ICD-10-CM | POA: Diagnosis not present

## 2019-02-18 DIAGNOSIS — L820 Inflamed seborrheic keratosis: Secondary | ICD-10-CM | POA: Diagnosis not present

## 2019-05-07 DIAGNOSIS — D1801 Hemangioma of skin and subcutaneous tissue: Secondary | ICD-10-CM | POA: Diagnosis not present

## 2019-05-07 DIAGNOSIS — Z85828 Personal history of other malignant neoplasm of skin: Secondary | ICD-10-CM | POA: Diagnosis not present

## 2019-05-07 DIAGNOSIS — L570 Actinic keratosis: Secondary | ICD-10-CM | POA: Diagnosis not present

## 2019-05-07 DIAGNOSIS — L821 Other seborrheic keratosis: Secondary | ICD-10-CM | POA: Diagnosis not present

## 2019-05-07 DIAGNOSIS — D235 Other benign neoplasm of skin of trunk: Secondary | ICD-10-CM | POA: Diagnosis not present

## 2019-08-05 DIAGNOSIS — Z881 Allergy status to other antibiotic agents status: Secondary | ICD-10-CM | POA: Diagnosis not present

## 2019-08-05 DIAGNOSIS — Z8546 Personal history of malignant neoplasm of prostate: Secondary | ICD-10-CM | POA: Diagnosis not present

## 2019-08-05 DIAGNOSIS — I6523 Occlusion and stenosis of bilateral carotid arteries: Secondary | ICD-10-CM | POA: Diagnosis not present

## 2019-08-05 DIAGNOSIS — R0689 Other abnormalities of breathing: Secondary | ICD-10-CM | POA: Diagnosis not present

## 2019-08-05 DIAGNOSIS — R001 Bradycardia, unspecified: Secondary | ICD-10-CM | POA: Diagnosis not present

## 2019-08-05 DIAGNOSIS — B9689 Other specified bacterial agents as the cause of diseases classified elsewhere: Secondary | ICD-10-CM | POA: Diagnosis not present

## 2019-08-05 DIAGNOSIS — Z79899 Other long term (current) drug therapy: Secondary | ICD-10-CM | POA: Diagnosis not present

## 2019-08-05 DIAGNOSIS — I10 Essential (primary) hypertension: Secondary | ICD-10-CM | POA: Diagnosis not present

## 2019-08-05 DIAGNOSIS — R42 Dizziness and giddiness: Secondary | ICD-10-CM | POA: Diagnosis not present

## 2019-08-05 DIAGNOSIS — J019 Acute sinusitis, unspecified: Secondary | ICD-10-CM | POA: Diagnosis not present

## 2019-08-05 DIAGNOSIS — I252 Old myocardial infarction: Secondary | ICD-10-CM | POA: Diagnosis not present

## 2019-08-05 DIAGNOSIS — R000 Tachycardia, unspecified: Secondary | ICD-10-CM | POA: Diagnosis not present

## 2019-08-05 DIAGNOSIS — R55 Syncope and collapse: Secondary | ICD-10-CM | POA: Diagnosis not present

## 2019-08-30 DIAGNOSIS — J320 Chronic maxillary sinusitis: Secondary | ICD-10-CM | POA: Diagnosis not present

## 2019-08-30 DIAGNOSIS — R110 Nausea: Secondary | ICD-10-CM | POA: Diagnosis not present

## 2019-08-30 DIAGNOSIS — R0689 Other abnormalities of breathing: Secondary | ICD-10-CM | POA: Diagnosis not present

## 2019-08-30 DIAGNOSIS — R112 Nausea with vomiting, unspecified: Secondary | ICD-10-CM | POA: Diagnosis not present

## 2019-08-30 DIAGNOSIS — R42 Dizziness and giddiness: Secondary | ICD-10-CM | POA: Diagnosis not present

## 2019-08-30 DIAGNOSIS — R197 Diarrhea, unspecified: Secondary | ICD-10-CM | POA: Diagnosis not present

## 2019-08-30 DIAGNOSIS — I10 Essential (primary) hypertension: Secondary | ICD-10-CM | POA: Diagnosis not present

## 2019-11-12 DIAGNOSIS — L738 Other specified follicular disorders: Secondary | ICD-10-CM | POA: Diagnosis not present

## 2019-11-12 DIAGNOSIS — Z85828 Personal history of other malignant neoplasm of skin: Secondary | ICD-10-CM | POA: Diagnosis not present

## 2019-11-12 DIAGNOSIS — L570 Actinic keratosis: Secondary | ICD-10-CM | POA: Diagnosis not present

## 2019-11-12 DIAGNOSIS — C44712 Basal cell carcinoma of skin of right lower limb, including hip: Secondary | ICD-10-CM | POA: Diagnosis not present

## 2019-11-12 DIAGNOSIS — C44719 Basal cell carcinoma of skin of left lower limb, including hip: Secondary | ICD-10-CM | POA: Diagnosis not present

## 2019-11-12 DIAGNOSIS — C44519 Basal cell carcinoma of skin of other part of trunk: Secondary | ICD-10-CM | POA: Diagnosis not present

## 2019-11-12 DIAGNOSIS — D485 Neoplasm of uncertain behavior of skin: Secondary | ICD-10-CM | POA: Diagnosis not present

## 2019-11-12 DIAGNOSIS — L821 Other seborrheic keratosis: Secondary | ICD-10-CM | POA: Diagnosis not present

## 2020-03-11 DIAGNOSIS — Z0000 Encounter for general adult medical examination without abnormal findings: Secondary | ICD-10-CM | POA: Diagnosis not present

## 2020-03-11 DIAGNOSIS — Z9079 Acquired absence of other genital organ(s): Secondary | ICD-10-CM | POA: Diagnosis not present

## 2020-03-11 DIAGNOSIS — E785 Hyperlipidemia, unspecified: Secondary | ICD-10-CM | POA: Diagnosis not present

## 2020-03-11 DIAGNOSIS — K219 Gastro-esophageal reflux disease without esophagitis: Secondary | ICD-10-CM | POA: Diagnosis not present

## 2020-03-11 DIAGNOSIS — I2510 Atherosclerotic heart disease of native coronary artery without angina pectoris: Secondary | ICD-10-CM | POA: Diagnosis not present

## 2020-03-12 DIAGNOSIS — Z0000 Encounter for general adult medical examination without abnormal findings: Secondary | ICD-10-CM | POA: Diagnosis not present

## 2020-03-18 DIAGNOSIS — R42 Dizziness and giddiness: Secondary | ICD-10-CM | POA: Diagnosis not present

## 2020-03-18 DIAGNOSIS — R112 Nausea with vomiting, unspecified: Secondary | ICD-10-CM | POA: Diagnosis not present

## 2020-03-18 DIAGNOSIS — I10 Essential (primary) hypertension: Secondary | ICD-10-CM | POA: Diagnosis not present

## 2020-03-18 DIAGNOSIS — R110 Nausea: Secondary | ICD-10-CM | POA: Diagnosis not present

## 2020-06-24 DIAGNOSIS — Z85828 Personal history of other malignant neoplasm of skin: Secondary | ICD-10-CM | POA: Diagnosis not present

## 2020-06-24 DIAGNOSIS — D2261 Melanocytic nevi of right upper limb, including shoulder: Secondary | ICD-10-CM | POA: Diagnosis not present

## 2020-06-24 DIAGNOSIS — D485 Neoplasm of uncertain behavior of skin: Secondary | ICD-10-CM | POA: Diagnosis not present

## 2020-06-24 DIAGNOSIS — C44519 Basal cell carcinoma of skin of other part of trunk: Secondary | ICD-10-CM | POA: Diagnosis not present

## 2020-06-24 DIAGNOSIS — D225 Melanocytic nevi of trunk: Secondary | ICD-10-CM | POA: Diagnosis not present

## 2020-06-24 DIAGNOSIS — L821 Other seborrheic keratosis: Secondary | ICD-10-CM | POA: Diagnosis not present

## 2020-06-24 DIAGNOSIS — D224 Melanocytic nevi of scalp and neck: Secondary | ICD-10-CM | POA: Diagnosis not present

## 2020-06-24 DIAGNOSIS — C44529 Squamous cell carcinoma of skin of other part of trunk: Secondary | ICD-10-CM | POA: Diagnosis not present

## 2021-03-31 DIAGNOSIS — Z881 Allergy status to other antibiotic agents status: Secondary | ICD-10-CM | POA: Diagnosis not present

## 2021-03-31 DIAGNOSIS — Z7982 Long term (current) use of aspirin: Secondary | ICD-10-CM | POA: Diagnosis not present

## 2021-03-31 DIAGNOSIS — R32 Unspecified urinary incontinence: Secondary | ICD-10-CM | POA: Diagnosis not present

## 2021-03-31 DIAGNOSIS — R030 Elevated blood-pressure reading, without diagnosis of hypertension: Secondary | ICD-10-CM | POA: Diagnosis not present

## 2021-03-31 DIAGNOSIS — F419 Anxiety disorder, unspecified: Secondary | ICD-10-CM | POA: Diagnosis not present

## 2021-03-31 DIAGNOSIS — R69 Illness, unspecified: Secondary | ICD-10-CM | POA: Diagnosis not present

## 2021-03-31 DIAGNOSIS — N529 Male erectile dysfunction, unspecified: Secondary | ICD-10-CM | POA: Diagnosis not present

## 2021-03-31 DIAGNOSIS — H409 Unspecified glaucoma: Secondary | ICD-10-CM | POA: Diagnosis not present

## 2021-03-31 DIAGNOSIS — Z8249 Family history of ischemic heart disease and other diseases of the circulatory system: Secondary | ICD-10-CM | POA: Diagnosis not present

## 2021-03-31 DIAGNOSIS — I2510 Atherosclerotic heart disease of native coronary artery without angina pectoris: Secondary | ICD-10-CM | POA: Diagnosis not present

## 2021-03-31 DIAGNOSIS — Z8546 Personal history of malignant neoplasm of prostate: Secondary | ICD-10-CM | POA: Diagnosis not present

## 2021-03-31 DIAGNOSIS — K219 Gastro-esophageal reflux disease without esophagitis: Secondary | ICD-10-CM | POA: Diagnosis not present

## 2021-03-31 DIAGNOSIS — Z8673 Personal history of transient ischemic attack (TIA), and cerebral infarction without residual deficits: Secondary | ICD-10-CM | POA: Diagnosis not present

## 2021-03-31 DIAGNOSIS — I252 Old myocardial infarction: Secondary | ICD-10-CM | POA: Diagnosis not present

## 2021-04-07 DIAGNOSIS — E785 Hyperlipidemia, unspecified: Secondary | ICD-10-CM | POA: Diagnosis not present

## 2021-04-07 DIAGNOSIS — Z131 Encounter for screening for diabetes mellitus: Secondary | ICD-10-CM | POA: Diagnosis not present

## 2021-04-07 DIAGNOSIS — Z9079 Acquired absence of other genital organ(s): Secondary | ICD-10-CM | POA: Diagnosis not present

## 2021-04-07 DIAGNOSIS — G720 Drug-induced myopathy: Secondary | ICD-10-CM | POA: Diagnosis not present

## 2021-04-07 DIAGNOSIS — Z0000 Encounter for general adult medical examination without abnormal findings: Secondary | ICD-10-CM | POA: Diagnosis not present

## 2021-10-25 DIAGNOSIS — D225 Melanocytic nevi of trunk: Secondary | ICD-10-CM | POA: Diagnosis not present

## 2021-10-25 DIAGNOSIS — D485 Neoplasm of uncertain behavior of skin: Secondary | ICD-10-CM | POA: Diagnosis not present

## 2021-10-25 DIAGNOSIS — Z85828 Personal history of other malignant neoplasm of skin: Secondary | ICD-10-CM | POA: Diagnosis not present

## 2021-10-25 DIAGNOSIS — C44612 Basal cell carcinoma of skin of right upper limb, including shoulder: Secondary | ICD-10-CM | POA: Diagnosis not present

## 2021-10-25 DIAGNOSIS — L821 Other seborrheic keratosis: Secondary | ICD-10-CM | POA: Diagnosis not present

## 2021-10-25 DIAGNOSIS — D1801 Hemangioma of skin and subcutaneous tissue: Secondary | ICD-10-CM | POA: Diagnosis not present

## 2021-10-25 DIAGNOSIS — L820 Inflamed seborrheic keratosis: Secondary | ICD-10-CM | POA: Diagnosis not present

## 2021-10-25 DIAGNOSIS — L570 Actinic keratosis: Secondary | ICD-10-CM | POA: Diagnosis not present

## 2022-08-08 DIAGNOSIS — Z85828 Personal history of other malignant neoplasm of skin: Secondary | ICD-10-CM | POA: Diagnosis not present

## 2022-08-08 DIAGNOSIS — C44519 Basal cell carcinoma of skin of other part of trunk: Secondary | ICD-10-CM | POA: Diagnosis not present

## 2022-08-08 DIAGNOSIS — D045 Carcinoma in situ of skin of trunk: Secondary | ICD-10-CM | POA: Diagnosis not present

## 2022-08-08 DIAGNOSIS — L821 Other seborrheic keratosis: Secondary | ICD-10-CM | POA: Diagnosis not present

## 2022-08-08 DIAGNOSIS — D485 Neoplasm of uncertain behavior of skin: Secondary | ICD-10-CM | POA: Diagnosis not present

## 2022-08-08 DIAGNOSIS — L570 Actinic keratosis: Secondary | ICD-10-CM | POA: Diagnosis not present

## 2022-09-22 ENCOUNTER — Other Ambulatory Visit (HOSPITAL_COMMUNITY): Payer: Self-pay | Admitting: Nurse Practitioner

## 2022-09-22 DIAGNOSIS — N200 Calculus of kidney: Secondary | ICD-10-CM

## 2022-10-03 ENCOUNTER — Ambulatory Visit (HOSPITAL_COMMUNITY)
Admission: RE | Admit: 2022-10-03 | Discharge: 2022-10-03 | Disposition: A | Discharge status: Home / Self Care | Payer: No Typology Code available for payment source | Source: Ambulatory Visit | Attending: Nurse Practitioner | Admitting: Nurse Practitioner

## 2022-10-03 DIAGNOSIS — N200 Calculus of kidney: Secondary | ICD-10-CM | POA: Insufficient documentation

## 2022-10-03 DIAGNOSIS — N281 Cyst of kidney, acquired: Secondary | ICD-10-CM | POA: Diagnosis not present

## 2022-10-03 DIAGNOSIS — N179 Acute kidney failure, unspecified: Secondary | ICD-10-CM | POA: Diagnosis not present

## 2022-10-28 DIAGNOSIS — S51812A Laceration without foreign body of left forearm, initial encounter: Secondary | ICD-10-CM | POA: Diagnosis not present

## 2022-10-28 DIAGNOSIS — W268XXA Contact with other sharp object(s), not elsewhere classified, initial encounter: Secondary | ICD-10-CM | POA: Diagnosis not present

## 2023-02-02 DIAGNOSIS — L821 Other seborrheic keratosis: Secondary | ICD-10-CM | POA: Diagnosis not present

## 2023-02-02 DIAGNOSIS — Z85828 Personal history of other malignant neoplasm of skin: Secondary | ICD-10-CM | POA: Diagnosis not present

## 2023-02-02 DIAGNOSIS — D485 Neoplasm of uncertain behavior of skin: Secondary | ICD-10-CM | POA: Diagnosis not present

## 2023-02-02 DIAGNOSIS — D045 Carcinoma in situ of skin of trunk: Secondary | ICD-10-CM | POA: Diagnosis not present

## 2023-02-02 DIAGNOSIS — D2262 Melanocytic nevi of left upper limb, including shoulder: Secondary | ICD-10-CM | POA: Diagnosis not present

## 2023-02-02 DIAGNOSIS — R208 Other disturbances of skin sensation: Secondary | ICD-10-CM | POA: Diagnosis not present

## 2023-02-02 DIAGNOSIS — C44529 Squamous cell carcinoma of skin of other part of trunk: Secondary | ICD-10-CM | POA: Diagnosis not present

## 2023-02-02 DIAGNOSIS — D0462 Carcinoma in situ of skin of left upper limb, including shoulder: Secondary | ICD-10-CM | POA: Diagnosis not present

## 2023-02-02 DIAGNOSIS — L820 Inflamed seborrheic keratosis: Secondary | ICD-10-CM | POA: Diagnosis not present

## 2023-02-02 DIAGNOSIS — L570 Actinic keratosis: Secondary | ICD-10-CM | POA: Diagnosis not present

## 2023-02-02 DIAGNOSIS — D2261 Melanocytic nevi of right upper limb, including shoulder: Secondary | ICD-10-CM | POA: Diagnosis not present

## 2023-02-02 DIAGNOSIS — L905 Scar conditions and fibrosis of skin: Secondary | ICD-10-CM | POA: Diagnosis not present

## 2023-02-02 DIAGNOSIS — D225 Melanocytic nevi of trunk: Secondary | ICD-10-CM | POA: Diagnosis not present

## 2023-06-09 ENCOUNTER — Telehealth (HOSPITAL_COMMUNITY): Payer: Self-pay

## 2023-06-09 NOTE — Telephone Encounter (Signed)
 Called patient regarding referral to Cardiac Rehab for PTCA from [facility]. He only has [employer] coverage so I explained to patient that his referral would need to come through [place] and he was already aware of this. He plans to contact his cardiologist at the [facility] and request a referral. I provided our contact information and told him to call me back if he has any issues getting a referral.

## 2023-06-16 ENCOUNTER — Encounter (HOSPITAL_COMMUNITY)
Admission: RE | Admit: 2023-06-16 | Discharge: 2023-06-16 | Disposition: A | Discharge status: Home / Self Care | Source: Ambulatory Visit | Attending: Cardiovascular Disease | Admitting: Cardiovascular Disease

## 2023-06-16 DIAGNOSIS — I259 Chronic ischemic heart disease, unspecified: Secondary | ICD-10-CM | POA: Insufficient documentation

## 2023-06-16 DIAGNOSIS — Z955 Presence of coronary angioplasty implant and graft: Secondary | ICD-10-CM | POA: Insufficient documentation

## 2023-06-19 ENCOUNTER — Encounter (HOSPITAL_COMMUNITY)
Admission: RE | Admit: 2023-06-19 | Discharge: 2023-06-19 | Disposition: A | Discharge status: Home / Self Care | Source: Ambulatory Visit | Attending: Cardiovascular Disease

## 2023-06-19 VITALS — Ht 71.5 in | Wt 188.9 lb

## 2023-06-19 DIAGNOSIS — I259 Chronic ischemic heart disease, unspecified: Secondary | ICD-10-CM | POA: Diagnosis present

## 2023-06-19 DIAGNOSIS — Z955 Presence of coronary angioplasty implant and graft: Secondary | ICD-10-CM

## 2023-06-19 NOTE — Patient Instructions (Signed)
 Patient Instructions  Patient Details  Name: Trevor Branch MRN: [id number] Date of Birth: 1945/11/14 Referring Provider:  Roland Rack, MD  Below are your personal goals for exercise, nutrition, and risk factors. Our goal is to help you stay on track towards obtaining and maintaining these goals. We will be discussing your progress on these goals with you throughout the program.  Initial Exercise Prescription:  Initial Exercise Prescription - 06/19/23 1000       Date of Initial Exercise RX and Referring Provider   Date 06/19/23    Referring Provider Carleene Cooper MD      Treadmill   MPH 1.4    Grade 0    Minutes 15    METs 2.07      REL-XR   Level 2    Speed 60    Minutes 15    METs 2      Prescription Details   Frequency (times per week) 2    Duration Progress to 30 minutes of continuous aerobic without signs/symptoms of physical distress      Intensity   THRR 40-80% of Max Heartrate 90-125    Ratings of Perceived Exertion 11-13    Perceived Dyspnea 0-4      Resistance Training   Training Prescription Yes    Weight 4    Reps 10-15             Exercise Goals: Frequency: Be able to perform aerobic exercise two to three times per week in program working toward 2-5 days per week of home exercise.  Intensity: Work with a perceived exertion of 11 (fairly light) - 15 (hard) while following your exercise prescription.  We will make changes to your prescription with you as you progress through the program.   Duration: Be able to do 30 to 45 minutes of continuous aerobic exercise in addition to a 5 minute warm-up and a 5 minute cool-down routine.   Nutrition Goals: Your personal nutrition goals will be established when you do your nutrition analysis with the dietician.  The following are general nutrition guidelines to follow: Cholesterol < 200mg /day Sodium < 1500mg /day Fiber: Men over 50 yrs - 30 grams per day  Personal Goals:  Personal Goals and Risk  Factors at Admission - 06/19/23 0913       Core Components/Risk Factors/Patient Goals on Admission    Weight Management Weight Maintenance    Improve shortness of breath with ADL's Yes    Intervention Provide education, individualized exercise plan and daily activity instruction to help decrease symptoms of SOB with activities of daily living.    Expected Outcomes Short Term: Improve cardiorespiratory fitness to achieve a reduction of symptoms when performing ADLs;Long Term: Be able to perform more ADLs without symptoms or delay the onset of symptoms    Hypertension Yes    Intervention Provide education on lifestyle modifcations including regular physical activity/exercise, weight management, moderate sodium restriction and increased consumption of fresh fruit, vegetables, and low fat dairy, alcohol moderation, and smoking cessation.;Monitor prescription use compliance.    Expected Outcomes Short Term: Continued assessment and intervention until BP is < 140/75mm HG in hypertensive participants. < 130/5mm HG in hypertensive participants with diabetes, heart failure or chronic kidney disease.;Long Term: Maintenance of blood pressure at goal levels.    Lipids Yes    Intervention Provide education and support for participant on nutrition & aerobic/resistive exercise along with prescribed medications to achieve LDL 70mg , HDL >40mg .    Expected Outcomes Short Term:  Participant states understanding of desired cholesterol values and is compliant with medications prescribed. Participant is following exercise prescription and nutrition guidelines.;Long Term: Cholesterol controlled with medications as prescribed, with individualized exercise RX and with personalized nutrition plan. Value goals: LDL < 70mg , HDL > 40 mg.             Tobacco Use Initial Evaluation: Social History   Tobacco Use  Smoking Status Never  Smokeless Tobacco Never    Exercise Goals and Review:  Exercise Goals     Row  Name 06/19/23 1008             Exercise Goals   Increase Physical Activity Yes       Intervention Provide advice, education, support and counseling about physical activity/exercise needs.;Develop an individualized exercise prescription for aerobic and resistive training based on initial evaluation findings, risk stratification, comorbidities and participant's personal goals.       Expected Outcomes Short Term: Attend rehab on a regular basis to increase amount of physical activity.;Long Term: Add in home exercise to make exercise part of routine and to increase amount of physical activity.;Long Term: Exercising regularly at least 3-5 days a week.       Increase Strength and Stamina Yes       Intervention Provide advice, education, support and counseling about physical activity/exercise needs.;Develop an individualized exercise prescription for aerobic and resistive training based on initial evaluation findings, risk stratification, comorbidities and participant's personal goals.       Expected Outcomes Short Term: Increase workloads from initial exercise prescription for resistance, speed, and METs.;Short Term: Perform resistance training exercises routinely during rehab and add in resistance training at home;Long Term: Improve cardiorespiratory fitness, muscular endurance and strength as measured by increased METs and functional capacity ( )       Able to understand and use rate of perceived exertion (RPE) scale Yes       Intervention Provide education and explanation on how to use RPE scale       Expected Outcomes Short Term: Able to use RPE daily in rehab to express subjective intensity level;Long Term:  Able to use RPE to guide intensity level when exercising independently       Able to understand and use Dyspnea scale Yes       Intervention Provide education and explanation on how to use Dyspnea scale       Expected Outcomes Short Term: Able to use Dyspnea scale daily in rehab to express  subjective sense of shortness of breath during exertion;Long Term: Able to use Dyspnea scale to guide intensity level when exercising independently       Knowledge and understanding of Target Heart Rate Range (THRR) Yes       Intervention Provide education and explanation of THRR including how the numbers were predicted and where they are located for reference       Expected Outcomes Short Term: Able to state/look up THRR;Long Term: Able to use THRR to govern intensity when exercising independently;Short Term: Able to use daily as guideline for intensity in rehab       Able to check pulse independently Yes       Intervention Provide education and demonstration on how to check pulse in carotid and radial arteries.;Review the importance of being able to check your own pulse for safety during independent exercise       Expected Outcomes Short Term: Able to explain why pulse checking is important during independent exercise;Long Term: Able to check pulse  independently and accurately       Understanding of Exercise Prescription Yes       Intervention Provide education, explanation, and written materials on patient's individual exercise prescription       Expected Outcomes Short Term: Able to explain program exercise prescription;Long Term: Able to explain home exercise prescription to exercise independently                Copy of goals given to participant.

## 2023-06-19 NOTE — Progress Notes (Signed)
 Cardiac Individual Treatment Plan  Patient Details  Name: Trevor Branch MRN: [id number] Date of Birth: [birth year] Referring Provider:   Flowsheet Row CARDIAC REHAB PHASE II ORIENTATION from 06/19/2023 in [facility] CARDIAC REHABILITATION  Referring Provider Carleene Cooper MD       Initial Encounter Date:  Flowsheet Row CARDIAC REHAB PHASE II ORIENTATION from 06/19/2023 in [place] CARDIAC REHABILITATION  Date 06/19/23       Visit Diagnosis: Chronic ischemic heart disease  Status post coronary artery stent placement  Patient's Home Medications on Admission:  Current Outpatient Medications:    ALPRAZolam (XANAX) 0.5 MG tablet, Take 0.5 mg by mouth as needed for anxiety., Disp: , Rfl:    aspirin EC 81 MG tablet, Take 1 tablet (81 mg total) by mouth daily. (Patient taking differently: Take 81 mg by mouth every evening.), Disp: 90 tablet, Rfl: 3   clopidogrel (PLAVIX) 75 MG tablet, Take 75 mg by mouth daily., Disp: , Rfl:    cycloSPORINE (RESTASIS) 0.05 % ophthalmic emulsion, Place 1 drop into both eyes 2 (two) times daily., Disp: , Rfl:    ezetimibe (ZETIA) 10 MG tablet, Take 10 mg by mouth daily., Disp: , Rfl:    isosorbide mononitrate (IMDUR) 30 MG 24 hr tablet, Take 30 mg by mouth daily., Disp: , Rfl:    latanoprost (XALATAN) 0.005 % ophthalmic solution, Place 1 drop into both eyes at bedtime. , Disp: , Rfl:    nitroGLYCERIN (NITROSTAT) 0.4 MG SL tablet, Place 0.4 mg under the tongue every 5 (five) minutes as needed for chest pain., Disp: , Rfl:    omeprazole (PRILOSEC) 40 MG capsule, Take 40 mg by mouth daily., Disp: , Rfl:    timolol (TIMOPTIC) 0.5 % ophthalmic solution, Place 1 drop into both eyes daily., Disp: , Rfl:    acetaminophen (TYLENOL) 500 MG tablet, Take 1,000 mg by mouth daily as needed for moderate pain. (Patient not taking: Reported on 06/19/2023), Disp: , Rfl:    aspirin-acetaminophen-caffeine (EXCEDRIN MIGRAINE) 250-250-65 MG tablet, Take 2 tablets by mouth daily  as needed for headache or migraine. (Patient not taking: Reported on 06/19/2023), Disp: , Rfl:    famotidine (PEPCID) 20 MG tablet, Take 20 mg by mouth 3 (three) times daily with meals. (Patient not taking: Reported on 06/19/2023), Disp: , Rfl:    Polyvinyl Alcohol-Povidone (REFRESH OP), Apply 1 drop to eye daily as needed (dry eyes). (Patient not taking: Reported on 06/19/2023), Disp: , Rfl:   Past Medical History: Past Medical History:  Diagnosis Date   Anxiety    Arthritis    shoulder    CAD (coronary artery disease)    a. NSTEMI 3/17 - s/p DES x 2 to mid LAD; staged PCI with DES to OM1; residual CAD - mLCX 40%, pRCA 30%, mRCA 20%    Cancer (HCC)    skin cancer and prostate cancer    Complication of anesthesia    stopped breathing during surgery age 46 , 2009- SVT ablation - quit breathinig per patient anesthesia had to be reversed per patient    Depression    GERD (gastroesophageal reflux disease)    Glaucoma    Headache    history of migraines    History of kidney stones    History of non-ST elevation myocardial infarction (NSTEMI) 06/2015   Hyperlipidemia    Ischemic cardiomyopathy    a. Echo 06/23/15 - EF 35-40%, ant-septal, inferoseptal, apical AK, Gr 1 DD, trivial AI >>  b. Echo 5/17 - EF  55-60%, no RWMA, Gr 1 DD   Myocardial infarction (HCC)    Panic disorder    PTSD (post-traumatic stress disorder)    panic attacks   SVT (supraventricular tachycardia) (HCC)    s/p prior RFCA    Tobacco Use: Social History   Tobacco Use  Smoking Status Never  Smokeless Tobacco Never    Labs: Review Flowsheet  More data exists      Latest Ref Rng & Units 12/09/2006 10/03/2007 06/20/2015 12/31/2015 12/16/2016  Labs for ITP Cardiac and Pulmonary Rehab  Cholestrol 100 - 199 mg/dL - 981  191  478  295   LDL (calc) 0 - 99 mg/dL - - 621  308  657   Direct LDL mg/dL - 846.9  - - -  HDL-C >62 mg/dL - 95.2  44  53  47   Trlycerides 0 - 149 mg/dL - 841  71  324  401   Hemoglobin A1c 4.8 - 5.6  % - - 5.7  - -  Bicarbonate - 27.2  - - - -  TCO2 - 29  - - - -    Capillary Blood Glucose: Lab Results  Component Value Date   GLUCAP 99 06/20/2015     Exercise Target Goals: Exercise Program Goal: Individual exercise prescription set using results from initial 6 min walk test and THRR while considering  patient's activity barriers and safety.   Exercise Prescription Goal: Starting with aerobic activity 30 plus minutes a day, 3 days per week for initial exercise prescription. Provide home exercise prescription and guidelines that participant acknowledges understanding prior to discharge.  Activity Barriers & Risk Stratification:  Activity Barriers & Cardiac Risk Stratification - 06/19/23 0857       Activity Barriers & Cardiac Risk Stratification   Activity Barriers Shortness of Breath;Chest Pain/Angina;Balance Concerns;Deconditioning    Cardiac Risk Stratification Moderate             6 Minute Walk:  6 Minute Walk     Row Name 06/19/23 1005         6 Minute Walk   Phase Initial     Distance 1500 feet     Walk Time 6 minutes     # of Rest Breaks 0     MPH 2.84     METS 2.67     RPE 11     Perceived Dyspnea  0     VO2 Peak 9.36     Symptoms No     Resting HR 55 bpm     Resting BP 120/60     Resting Oxygen Saturation  97 %     Exercise Oxygen Saturation  during 6 min walk 90 %     Max Ex. HR 73 bpm     Max Ex. BP 122/60     2 Minute Post BP 118/60              Oxygen Initial Assessment:   Oxygen Re-Evaluation:   Oxygen Discharge (Final Oxygen Re-Evaluation):   Initial Exercise Prescription:  Initial Exercise Prescription - 06/19/23 1000       Date of Initial Exercise RX and Referring Provider   Date 06/19/23    Referring Provider Carleene Cooper MD      Treadmill   MPH 1.4    Grade 0    Minutes 15    METs 2.07      REL-XR   Level 2    Speed 60    Minutes 15  METs 2      Prescription Details   Frequency (times per week) 2     Duration Progress to 30 minutes of continuous aerobic without signs/symptoms of physical distress      Intensity   THRR 40-80% of Max Heartrate 90-125    Ratings of Perceived Exertion 11-13    Perceived Dyspnea 0-4      Resistance Training   Training Prescription Yes    Weight 4    Reps 10-15             Perform Capillary Blood Glucose checks as needed.  Exercise Prescription Changes:   Exercise Prescription Changes     Row Name 06/19/23 1000             Response to Exercise   Blood Pressure (Admit) 120/60       Blood Pressure (Exercise) 122/60       Blood Pressure (Exit) 118/60       Heart Rate (Admit) 55 bpm       Heart Rate (Exercise) 73 bpm       Heart Rate (Exit) 65 bpm       Oxygen Saturation (Admit) 97 %       Oxygen Saturation (Exercise) 90 %       Oxygen Saturation (Exit) 98 %       Rating of Perceived Exertion (Exercise) 11       Perceived Dyspnea (Exercise) 0                Exercise Comments:   Exercise Goals and Review:   Exercise Goals     Row Name 06/19/23 1008             Exercise Goals   Increase Physical Activity Yes       Intervention Provide advice, education, support and counseling about physical activity/exercise needs.;Develop an individualized exercise prescription for aerobic and resistive training based on initial evaluation findings, risk stratification, comorbidities and participant's personal goals.       Expected Outcomes Short Term: Attend rehab on a regular basis to increase amount of physical activity.;Long Term: Add in home exercise to make exercise part of routine and to increase amount of physical activity.;Long Term: Exercising regularly at least 3-5 days a week.       Increase Strength and Stamina Yes       Intervention Provide advice, education, support and counseling about physical activity/exercise needs.;Develop an individualized exercise prescription for aerobic and resistive training based on initial  evaluation findings, risk stratification, comorbidities and participant's personal goals.       Expected Outcomes Short Term: Increase workloads from initial exercise prescription for resistance, speed, and METs.;Short Term: Perform resistance training exercises routinely during rehab and add in resistance training at home;Long Term: Improve cardiorespiratory fitness, muscular endurance and strength as measured by increased METs and functional capacity ( )       Able to understand and use rate of perceived exertion (RPE) scale Yes       Intervention Provide education and explanation on how to use RPE scale       Expected Outcomes Short Term: Able to use RPE daily in rehab to express subjective intensity level;Long Term:  Able to use RPE to guide intensity level when exercising independently       Able to understand and use Dyspnea scale Yes       Intervention Provide education and explanation on how to use Dyspnea scale  Expected Outcomes Short Term: Able to use Dyspnea scale daily in rehab to express subjective sense of shortness of breath during exertion;Long Term: Able to use Dyspnea scale to guide intensity level when exercising independently       Knowledge and understanding of Target Heart Rate Range (THRR) Yes       Intervention Provide education and explanation of THRR including how the numbers were predicted and where they are located for reference       Expected Outcomes Short Term: Able to state/look up THRR;Long Term: Able to use THRR to govern intensity when exercising independently;Short Term: Able to use daily as guideline for intensity in rehab       Able to check pulse independently Yes       Intervention Provide education and demonstration on how to check pulse in carotid and radial arteries.;Review the importance of being able to check your own pulse for safety during independent exercise       Expected Outcomes Short Term: Able to explain why pulse checking is important  during independent exercise;Long Term: Able to check pulse independently and accurately       Understanding of Exercise Prescription Yes       Intervention Provide education, explanation, and written materials on patient's individual exercise prescription       Expected Outcomes Short Term: Able to explain program exercise prescription;Long Term: Able to explain home exercise prescription to exercise independently                Exercise Goals Re-Evaluation :    Discharge Exercise Prescription (Final Exercise Prescription Changes):  Exercise Prescription Changes - 06/19/23 1000       Response to Exercise   Blood Pressure (Admit) 120/60    Blood Pressure (Exercise) 122/60    Blood Pressure (Exit) 118/60    Heart Rate (Admit) 55 bpm    Heart Rate (Exercise) 73 bpm    Heart Rate (Exit) 65 bpm    Oxygen Saturation (Admit) 97 %    Oxygen Saturation (Exercise) 90 %    Oxygen Saturation (Exit) 98 %    Rating of Perceived Exertion (Exercise) 11    Perceived Dyspnea (Exercise) 0             Nutrition:  Target Goals: Understanding of nutrition guidelines, daily intake of sodium 1500mg , cholesterol 200mg , calories 30% from fat and 7% or less from saturated fats, daily to have 5 or more servings of fruits and vegetables.  Biometrics:  Pre Biometrics - 06/19/23 1008       Pre Biometrics   Height 5' 11.5" (1.816 m)    Weight 85.7 kg    Waist Circumference 40 inches    Hip Circumference 38 inches    Waist to Hip Ratio 1.05 %    BMI (Calculated) 25.99    Grip Strength 44.3 kg    Single Leg Stand 35.8 seconds              Nutrition Therapy Plan and Nutrition Goals:   Nutrition Assessments:  MEDIFICTS Score Key: >=70 Need to make dietary changes  40-70 Heart Healthy Diet <= 40 Therapeutic Level Cholesterol Diet   Picture Your Plate Scores: <11 Unhealthy dietary pattern with much room for improvement. 41-50 Dietary pattern unlikely to meet recommendations for  good health and room for improvement. 51-60 More healthful dietary pattern, with some room for improvement.  >60 Healthy dietary pattern, although there may be some specific behaviors that could be improved.  Nutrition Goals Re-Evaluation:   Nutrition Goals Discharge (Final Nutrition Goals Re-Evaluation):   Psychosocial: Target Goals: Acknowledge presence or absence of significant depression and/or stress, maximize coping skills, provide positive support system. Participant is able to verbalize types and ability to use techniques and skills needed for reducing stress and depression.  Initial Review & Psychosocial Screening:  Initial Psych Review & Screening - 06/19/23 0913       Initial Review   Current issues with History of Depression;Current Anxiety/Panic;Current Depression;Current Sleep Concerns;Current Psychotropic Meds   Patient severed in the [employer] in [place]. He has PTSD.     Family Dynamics   Good Support System? Yes      Barriers   Psychosocial barriers to participate in program The patient should benefit from training in stress management and relaxation.;There are no identifiable barriers or psychosocial needs.      Screening Interventions   Interventions Encouraged to exercise;Provide feedback about the scores to participant;To provide support and resources with identified psychosocial needs    Expected Outcomes Short Term goal: Utilizing psychosocial counselor, staff and physician to assist with identification of specific Stressors or current issues interfering with healing process. Setting desired goal for each stressor or current issue identified.;Long Term Goal: Stressors or current issues are controlled or eliminated.;Short Term goal: Identification and review with participant of any Quality of Life or Depression concerns found by scoring the questionnaire.;Long Term goal: The participant improves quality of Life and PHQ9 Scores as seen by post scores and/or  verbalization of changes             Quality of Life Scores:  Scores of 19 and below usually indicate a poorer quality of life in these areas.  A difference of  2-3 points is a clinically meaningful difference.  A difference of 2-3 points in the total score of the Quality of Life Index has been associated with significant improvement in overall quality of life, self-image, physical symptoms, and general health in studies assessing change in quality of life.  PHQ-9: Review Flowsheet       06/19/2023  Depression screen PHQ 2/9  Decreased Interest 2  Down, Depressed, Hopeless 3  PHQ - 2 Score 5  Altered sleeping 1  Tired, decreased energy 3  Change in appetite 0  Feeling bad or failure about yourself  3  Trouble concentrating 2  Moving slowly or fidgety/restless 2  Suicidal thoughts 0  PHQ-9 Score 16  Difficult doing work/chores Somewhat difficult   Interpretation of Total Score  Total Score Depression Severity:  1-4 = Minimal depression, 5-9 = Mild depression, 10-14 = Moderate depression, 15-19 = Moderately severe depression, 20-27 = Severe depression   Psychosocial Evaluation and Intervention:  Psychosocial Evaluation - 06/19/23 0943       Psychosocial Evaluation & Interventions   Interventions Stress management education;Relaxation education;Encouraged to exercise with the program and follow exercise prescription    Comments Patient was referred to CR from the [place] with ischemic HD and stent placement 2/17. He had 3 stents placed at [facility] and has a history of MI with 4 stents placed some years ago. He severed 4 years in the [place] in [place]. His initial PHQ-9 score was 18. He says he does have some depression/aneixty and PTSD. He is followed by behavorial health at the [facility] q 2 weeks and takes Alprazolam. He feels his psychosocial issues are managed well. He had prostate cancer several years ago and was treated with surgery, radiation and lupron. 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 He used to exercise and has equipment at home for exercise but says he just does not have the energy to do anything right now. He reports trouble both getting to sleep and staying asleep. He likes to work with Scientist, physiological and he likes to fix things. 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 His wife is his main support along with her 2 sons. He seems motivated to participate in the program. He wants to start with 2 days/week and may increase to 3. He has also been referred to PT/OT by the [facility] and is not sure yet what this will entail. His main goals for the program are to get his muscle mass back; improve his SOB and energy and get back to exercising at home and doing the things he enjoys. He has no barriers identified to complete the program.    Expected Outcomes Short Term: Patient will start the program and attend consistently. Long Term: Patient will complete the program meeting personal goals.    Continue Psychosocial Services  Follow up required by staff             Psychosocial Re-Evaluation:   Psychosocial Discharge (Final Psychosocial Re-Evaluation):   Vocational Rehabilitation: Provide vocational rehab assistance to qualifying candidates.   Vocational Rehab Evaluation & Intervention:  Vocational Rehab - 06/19/23 0912       Initial Vocational Rehab Evaluation & Intervention   Assessment shows need for Vocational Rehabilitation No      Vocational Rehab Re-Evaulation   Comments Patient is retired.             Education: Education Goals: Education classes will be provided on a weekly basis, covering required topics. Participant will state understanding/return demonstration of topics presented.  Learning Barriers/Preferences:  Learning Barriers/Preferences - 06/19/23 1610       Learning Barriers/Preferences   Learning Barriers Sight   Has  difficulty with close vision due to CVA several years ago.   Learning Preferences Skilled Demonstration;Pictoral             Education Topics: Hypertension, Hypertension Reduction -Define heart disease and high blood pressure. Discus how high blood pressure affects the body and ways to reduce high blood pressure.   Exercise and Your Heart -Discuss why it is important to exercise, the FITT principles of exercise, normal and abnormal responses to exercise, and how to exercise safely.   Angina -Discuss definition of angina, causes of angina, treatment of angina, and how to decrease risk of having angina.   Cardiac Medications -Review what the following cardiac medications are used for, how they affect the body, and side effects that may occur when taking the medications.  Medications include Aspirin, Beta blockers, calcium channel blockers, ACE Inhibitors, angiotensin receptor blockers, diuretics, digoxin, and antihyperlipidemics.   Congestive Heart Failure -Discuss the definition of CHF, how to live with CHF, the signs and symptoms of CHF, and how keep track of weight and sodium intake.   Heart Disease and Intimacy -Discus the effect sexual activity has on the heart, how changes occur during intimacy as we age, and safety during sexual activity.   Smoking Cessation / COPD -Discuss different methods to quit smoking, the health benefits of quitting smoking, and the definition of COPD.   Nutrition I: Fats -Discuss the types of cholesterol, what cholesterol does to  the heart, and how cholesterol levels can be controlled.   Nutrition II: Labels -Discuss the different components of food labels and how to read food label   Heart Parts/Heart Disease and PAD -Discuss the anatomy of the heart, the pathway of blood circulation through the heart, and these are affected by heart disease.   Stress I: Signs and Symptoms -Discuss the causes of stress, how stress may lead to anxiety  and depression, and ways to limit stress.   Stress II: Relaxation -Discuss different types of relaxation techniques to limit stress.   Warning Signs of Stroke / TIA -Discuss definition of a stroke, what the signs and symptoms are of a stroke, and how to identify when someone is having stroke.   Knowledge Questionnaire Score:   Core Components/Risk Factors/Patient Goals at Admission:  Personal Goals and Risk Factors at Admission - 06/19/23 0913       Core Components/Risk Factors/Patient Goals on Admission    Weight Management Weight Maintenance    Improve shortness of breath with ADL's Yes    Intervention Provide education, individualized exercise plan and daily activity instruction to help decrease symptoms of SOB with activities of daily living.    Expected Outcomes Short Term: Improve cardiorespiratory fitness to achieve a reduction of symptoms when performing ADLs;Long Term: Be able to perform more ADLs without symptoms or delay the onset of symptoms    Hypertension Yes    Intervention Provide education on lifestyle modifcations including regular physical activity/exercise, weight management, moderate sodium restriction and increased consumption of fresh fruit, vegetables, and low fat dairy, alcohol moderation, and smoking cessation.;Monitor prescription use compliance.    Expected Outcomes Short Term: Continued assessment and intervention until BP is < 140/52mm HG in hypertensive participants. < 130/38mm HG in hypertensive participants with diabetes, heart failure or chronic kidney disease.;Long Term: Maintenance of blood pressure at goal levels.    Lipids Yes    Intervention Provide education and support for participant on nutrition & aerobic/resistive exercise along with prescribed medications to achieve LDL 70mg , HDL >40mg .    Expected Outcomes Short Term: Participant states understanding of desired cholesterol values and is compliant with medications prescribed. Participant is  following exercise prescription and nutrition guidelines.;Long Term: Cholesterol controlled with medications as prescribed, with individualized exercise RX and with personalized nutrition plan. Value goals: LDL < 70mg , HDL > 40 mg.             Core Components/Risk Factors/Patient Goals Review:    Core Components/Risk Factors/Patient Goals at Discharge (Final Review):    ITP Comments:   Comments: Patient arrived for 1st visit/orientation/education at 0800. Patient was referred to CR by Dr. Joycelyn Rua at the [facility] due to Ischemic Heart Disease and stent placement. During orientation advised patient on arrival and appointment times what to wear, what to do before, during and after exercise. Reviewed attendance and class policy.  Pt is scheduled to return Cardiac Rehab on 06/20/23  at 1330. Pt was advised to come to class 15 minutes before class starts.  Discussed RPE/Dpysnea scales. Patient participated in warm up stretches. Patient was able to complete 6 minute walk test.  Telemetry:NSR. Patient was measured for the equipment. Discussed equipment safety with patient. Took patient pre-anthropometric measurements. Patient finished visit at 1013.

## 2023-06-22 ENCOUNTER — Encounter (HOSPITAL_COMMUNITY)
Admission: RE | Admit: 2023-06-22 | Discharge: 2023-06-22 | Discharge status: Home / Self Care | Source: Ambulatory Visit | Attending: Family Medicine

## 2023-06-22 DIAGNOSIS — I259 Chronic ischemic heart disease, unspecified: Secondary | ICD-10-CM | POA: Diagnosis not present

## 2023-06-22 DIAGNOSIS — Z955 Presence of coronary angioplasty implant and graft: Secondary | ICD-10-CM

## 2023-06-22 NOTE — Progress Notes (Signed)
 Daily Session Note  Patient Details  Name: Trevor Branch MRN: [id number] Date of Birth: 01-18-1946 Referring Provider:   Flowsheet Row CARDIAC REHAB PHASE II ORIENTATION from 06/19/2023 in [facility] CARDIAC REHABILITATION  Referring Provider Carleene Cooper MD       Encounter Date: 06/22/2023  Check In:  Session Check In - 06/22/23 1336       Check-In   Supervising physician immediately available to respond to emergencies See telemetry face sheet for immediately available MD    Location [facility]    Staff Present Ross Ludwig, BS, Exercise Physiologist;Hillary [place] BSN, RN;Hester Forget Ehrhardt, MA, RCEP, CCRP, CCET    Virtual Visit No    Medication changes reported     No    Fall or balance concerns reported    No    Warm-up and Cool-down Performed on first and last piece of equipment    Resistance Training Performed Yes    VAD Patient? No    PAD/SET Patient? No      Pain Assessment   Currently in Pain? No/denies             Capillary Blood Glucose: No results found for this or any previous visit (from the past 24 hours).    Social History   Tobacco Use  Smoking Status Never  Smokeless Tobacco Never    Goals Met:  Exercise tolerated well Personal goals reviewed No report of concerns or symptoms today Strength training completed today  Goals Unmet:  Not Applicable  Comments: First full day of exercise!  Patient was oriented to gym and equipment including functions, settings, policies, and procedures.  Patient's individual exercise prescription and treatment plan were reviewed.  All starting workloads were established based on the results of the 6 minute walk test done at initial orientation visit.  The plan for exercise progression was also introduced and progression will be customized based on patient's performance and goals.

## 2023-06-27 ENCOUNTER — Encounter (HOSPITAL_COMMUNITY)
Admission: RE | Admit: 2023-06-27 | Discharge: 2023-06-27 | Disposition: A | Discharge status: Home / Self Care | Source: Ambulatory Visit | Attending: Family Medicine

## 2023-06-27 DIAGNOSIS — I259 Chronic ischemic heart disease, unspecified: Secondary | ICD-10-CM

## 2023-06-27 DIAGNOSIS — Z955 Presence of coronary angioplasty implant and graft: Secondary | ICD-10-CM

## 2023-06-27 NOTE — Progress Notes (Signed)
 Daily Session Note  Patient Details  Name: FEDERICO MAIORINO MRN: [id number] Date of Birth: 05/26/1945 Referring Provider:   Flowsheet Row CARDIAC REHAB PHASE II ORIENTATION from 06/19/2023 in [facility] CARDIAC REHABILITATION  Referring Provider Carleene Cooper MD       Encounter Date: 06/27/2023  Check In:  Session Check In - 06/27/23 1330       Check-In   Supervising physician immediately available to respond to emergencies See telemetry face sheet for immediately available MD    Location [facility]    Staff Present Ross Ludwig, BS, Exercise Physiologist;Brittany Roseanne Reno, BSN, RN, WTA-C;Raphel Stickles, RN;Jessica [place], MA, RCEP, CCRP, CCET    Virtual Visit No    Medication changes reported     No    Fall or balance concerns reported    No    Warm-up and Cool-down Performed on first and last piece of equipment    Resistance Training Performed Yes    VAD Patient? No    PAD/SET Patient? No      Pain Assessment   Currently in Pain? No/denies    Multiple Pain Sites No             Capillary Blood Glucose: No results found for this or any previous visit (from the past 24 hours).    Social History   Tobacco Use  Smoking Status Never  Smokeless Tobacco Never    Goals Met:  Independence with exercise equipment Exercise tolerated well No report of concerns or symptoms today Strength training completed today  Goals Unmet:  Not Applicable  Comments: Pt able to follow exercise prescription today without complaint.  Will continue to monitor for progression.

## 2023-06-29 ENCOUNTER — Encounter (HOSPITAL_COMMUNITY)
Admission: RE | Admit: 2023-06-29 | Discharge: 2023-06-29 | Disposition: A | Discharge status: Home / Self Care | Source: Ambulatory Visit | Attending: Family Medicine

## 2023-06-29 DIAGNOSIS — I259 Chronic ischemic heart disease, unspecified: Secondary | ICD-10-CM | POA: Diagnosis not present

## 2023-06-29 DIAGNOSIS — Z955 Presence of coronary angioplasty implant and graft: Secondary | ICD-10-CM

## 2023-06-29 NOTE — Progress Notes (Signed)
 Daily Session Note  Patient Details  Name: Trevor Branch MRN: [id number] Date of Birth: February 18, 1946 Referring Provider:   Flowsheet Row CARDIAC REHAB PHASE II ORIENTATION from 06/19/2023 in [facility] CARDIAC REHABILITATION  Referring Provider Carleene Cooper MD       Encounter Date: 06/29/2023  Check In:  Session Check In - 06/29/23 1315       Check-In   Supervising physician immediately available to respond to emergencies See telemetry face sheet for immediately available MD    Location [facility]    Staff Present Avanell Shackleton BSN, RN;Jessica [place], MA, [employer], CCRP, Dow Adolph, RN, BSN;Heather Cooper, [place], Exercise Physiologist    Virtual Visit No    Medication changes reported     No    Fall or balance concerns reported    No    Tobacco Cessation No Change    Warm-up and Cool-down Performed on first and last piece of equipment    Resistance Training Performed Yes    VAD Patient? No    PAD/SET Patient? No      Pain Assessment   Currently in Pain? No/denies    Multiple Pain Sites No             Capillary Blood Glucose: No results found for this or any previous visit (from the past 24 hours).    Social History   Tobacco Use  Smoking Status Never  Smokeless Tobacco Never    Goals Met:  Independence with exercise equipment Exercise tolerated well No report of concerns or symptoms today Strength training completed today  Goals Unmet:  Not Applicable  Comments: Marland KitchenMarland KitchenPt able to follow exercise prescription today without complaint.  Will continue to monitor for progression.

## 2023-07-04 ENCOUNTER — Encounter (HOSPITAL_COMMUNITY)
Admission: RE | Admit: 2023-07-04 | Discharge: 2023-07-04 | Disposition: A | Discharge status: Home / Self Care | Source: Ambulatory Visit | Attending: Family Medicine

## 2023-07-04 DIAGNOSIS — Z955 Presence of coronary angioplasty implant and graft: Secondary | ICD-10-CM

## 2023-07-04 DIAGNOSIS — I259 Chronic ischemic heart disease, unspecified: Secondary | ICD-10-CM

## 2023-07-04 NOTE — Progress Notes (Signed)
 Daily Session Note  Patient Details  Name: Trevor Branch MRN: [id number] Date of Birth: [birth year] Referring Provider:   Flowsheet Row CARDIAC REHAB PHASE II ORIENTATION from 06/19/2023 in [facility] CARDIAC REHABILITATION  Referring Provider Carleene Cooper MD       Encounter Date: 07/04/2023  Check In:  Session Check In - 07/04/23 1357       Check-In   Supervising physician immediately available to respond to emergencies See telemetry face sheet for immediately available MD    Location [facility]    Staff Present Fabio Pierce, MA, RCEP, CCRP, CCET;Heather Fredric Mare, [place], Exercise Physiologist;Phyllis Billingsley, RN;Brittany Roseanne Reno, BSN, RN, WTA-C    Virtual Visit No    Medication changes reported     No    Fall or balance concerns reported    No    Warm-up and Cool-down Performed on first and last piece of equipment    Resistance Training Performed Yes    VAD Patient? No    PAD/SET Patient? No      Pain Assessment   Currently in Pain? No/denies             Capillary Blood Glucose: No results found for this or any previous visit (from the past 24 hours).    Social History   Tobacco Use  Smoking Status Never  Smokeless Tobacco Never    Goals Met:  Exercise tolerated well No report of concerns or symptoms today Strength training completed today  Goals Unmet:  Not Applicable  Comments: Pt able to follow exercise prescription today without complaint.  Will continue to monitor for progression.

## 2023-07-05 ENCOUNTER — Encounter (HOSPITAL_COMMUNITY): Payer: Self-pay | Admitting: *Deleted

## 2023-07-05 DIAGNOSIS — I259 Chronic ischemic heart disease, unspecified: Secondary | ICD-10-CM

## 2023-07-05 DIAGNOSIS — Z955 Presence of coronary angioplasty implant and graft: Secondary | ICD-10-CM

## 2023-07-05 NOTE — Progress Notes (Signed)
 Cardiac Individual Treatment Plan  Patient Details  Name: Trevor Branch MRN: [id number] Date of Birth: Sep 22, 1945 Referring Provider:   Flowsheet Row CARDIAC REHAB PHASE II ORIENTATION from 06/19/2023 in [facility] CARDIAC REHABILITATION  Referring Provider Carleene Cooper MD       Initial Encounter Date:  Flowsheet Row CARDIAC REHAB PHASE II ORIENTATION from 06/19/2023 in [place] CARDIAC REHABILITATION  Date 06/19/23       Visit Diagnosis: Chronic ischemic heart disease  Status post coronary artery stent placement  Patient's Home Medications on Admission:  Current Outpatient Medications:    acetaminophen (TYLENOL) 500 MG tablet, Take 1,000 mg by mouth daily as needed for moderate pain. (Patient not taking: Reported on 06/19/2023), Disp: , Rfl:    ALPRAZolam (XANAX) 0.5 MG tablet, Take 0.5 mg by mouth as needed for anxiety., Disp: , Rfl:    aspirin EC 81 MG tablet, Take 1 tablet (81 mg total) by mouth daily. (Patient taking differently: Take 81 mg by mouth every evening.), Disp: 90 tablet, Rfl: 3   aspirin-acetaminophen-caffeine (EXCEDRIN MIGRAINE) 250-250-65 MG tablet, Take 2 tablets by mouth daily as needed for headache or migraine. (Patient not taking: Reported on 06/19/2023), Disp: , Rfl:    clopidogrel (PLAVIX) 75 MG tablet, Take 75 mg by mouth daily., Disp: , Rfl:    cycloSPORINE (RESTASIS) 0.05 % ophthalmic emulsion, Place 1 drop into both eyes 2 (two) times daily., Disp: , Rfl:    ezetimibe (ZETIA) 10 MG tablet, Take 10 mg by mouth daily., Disp: , Rfl:    famotidine (PEPCID) 20 MG tablet, Take 20 mg by mouth 3 (three) times daily with meals. (Patient not taking: Reported on 06/19/2023), Disp: , Rfl:    isosorbide mononitrate (IMDUR) 30 MG 24 hr tablet, Take 30 mg by mouth daily., Disp: , Rfl:    latanoprost (XALATAN) 0.005 % ophthalmic solution, Place 1 drop into both eyes at bedtime. , Disp: , Rfl:    nitroGLYCERIN (NITROSTAT) 0.4 MG SL tablet, Place 0.4 mg under the tongue  every 5 (five) minutes as needed for chest pain., Disp: , Rfl:    omeprazole (PRILOSEC) 40 MG capsule, Take 40 mg by mouth daily., Disp: , Rfl:    Polyvinyl Alcohol-Povidone (REFRESH OP), Apply 1 drop to eye daily as needed (dry eyes). (Patient not taking: Reported on 06/19/2023), Disp: , Rfl:    timolol (TIMOPTIC) 0.5 % ophthalmic solution, Place 1 drop into both eyes daily., Disp: , Rfl:   Past Medical History: Past Medical History:  Diagnosis Date   Anxiety    Arthritis    shoulder    CAD (coronary artery disease)    a. NSTEMI 3/17 - s/p DES x 2 to mid LAD; staged PCI with DES to OM1; residual CAD - mLCX 40%, pRCA 30%, mRCA 20%    Cancer (HCC)    skin cancer and prostate cancer    Complication of anesthesia    stopped breathing during surgery age 78 , 2009- SVT ablation - quit breathinig per patient anesthesia had to be reversed per patient    Depression    GERD (gastroesophageal reflux disease)    Glaucoma    Headache    history of migraines    History of kidney stones    History of non-ST elevation myocardial infarction (NSTEMI) 06/2015   Hyperlipidemia    Ischemic cardiomyopathy    a. Echo 06/23/15 - EF 35-40%, ant-septal, inferoseptal, apical AK, Gr 1 DD, trivial AI >>  b. Echo 5/17 - EF  55-60%, no RWMA, Gr 1 DD   Myocardial infarction (HCC)    Panic disorder    PTSD (post-traumatic stress disorder)    panic attacks   SVT (supraventricular tachycardia) (HCC)    s/p prior RFCA    Tobacco Use: Social History   Tobacco Use  Smoking Status Never  Smokeless Tobacco Never    Labs: Review Flowsheet  More data exists      Latest Ref Rng & Units 12/09/2006 10/03/2007 06/20/2015 12/31/2015 12/16/2016  Labs for ITP Cardiac and Pulmonary Rehab  Cholestrol 100 - 199 mg/dL - 161  096  045  409   LDL (calc) 0 - 99 mg/dL - - 811  914  782   Direct LDL mg/dL - 956.2  - - -  HDL-C >13 mg/dL - 08.6  44  53  47   Trlycerides 0 - 149 mg/dL - 578  71  469  629   Hemoglobin A1c 4.8 - 5.6  % - - 5.7  - -  Bicarbonate - 27.2  - - - -  TCO2 - 29  - - - -    Capillary Blood Glucose: Lab Results  Component Value Date   GLUCAP 99 06/20/2015     Exercise Target Goals: Exercise Program Goal: Individual exercise prescription set using results from initial 6 min walk test and THRR while considering  patient's activity barriers and safety.   Exercise Prescription Goal: Starting with aerobic activity 30 plus minutes a day, 3 days per week for initial exercise prescription. Provide home exercise prescription and guidelines that participant acknowledges understanding prior to discharge.  Activity Barriers & Risk Stratification:  Activity Barriers & Cardiac Risk Stratification - 06/19/23 0857       Activity Barriers & Cardiac Risk Stratification   Activity Barriers Shortness of Breath;Chest Pain/Angina;Balance Concerns;Deconditioning    Cardiac Risk Stratification Moderate             6 Minute Walk:  6 Minute Walk     Row Name 06/19/23 1005         6 Minute Walk   Phase Initial     Distance 1500 feet     Walk Time 6 minutes     # of Rest Breaks 0     MPH 2.84     METS 2.67     RPE 11     Perceived Dyspnea  0     VO2 Peak 9.36     Symptoms No     Resting HR 55 bpm     Resting BP 120/60     Resting Oxygen Saturation  97 %     Exercise Oxygen Saturation  during 6 min walk 90 %     Max Ex. HR 73 bpm     Max Ex. BP 122/60     2 Minute Post BP 118/60              Oxygen Initial Assessment:   Oxygen Re-Evaluation:   Oxygen Discharge (Final Oxygen Re-Evaluation):   Initial Exercise Prescription:  Initial Exercise Prescription - 06/19/23 1000       Date of Initial Exercise RX and Referring Provider   Date 06/19/23    Referring Provider Carleene Cooper MD      Treadmill   MPH 1.4    Grade 0    Minutes 15    METs 2.07      REL-XR   Level 2    Speed 60    Minutes 15  METs 2      Prescription Details   Frequency (times per week) 2     Duration Progress to 30 minutes of continuous aerobic without signs/symptoms of physical distress      Intensity   THRR 40-80% of Max Heartrate 90-125    Ratings of Perceived Exertion 11-13    Perceived Dyspnea 0-4      Resistance Training   Training Prescription Yes    Weight 4    Reps 10-15             Perform Capillary Blood Glucose checks as needed.  Exercise Prescription Changes:   Exercise Prescription Changes     Row Name 06/19/23 1000             Response to Exercise   Blood Pressure (Admit) 120/60       Blood Pressure (Exercise) 122/60       Blood Pressure (Exit) 118/60       Heart Rate (Admit) 55 bpm       Heart Rate (Exercise) 73 bpm       Heart Rate (Exit) 65 bpm       Oxygen Saturation (Admit) 97 %       Oxygen Saturation (Exercise) 90 %       Oxygen Saturation (Exit) 98 %       Rating of Perceived Exertion (Exercise) 11       Perceived Dyspnea (Exercise) 0                Exercise Comments:   Exercise Comments     Row Name 06/22/23 1337           Exercise Comments First full day of exercise!  Patient was oriented to gym and equipment including functions, settings, policies, and procedures.  Patient's individual exercise prescription and treatment plan were reviewed.  All starting workloads were established based on the results of the 6 minute walk test done at initial orientation visit.  The plan for exercise progression was also introduced and progression will be customized based on patient's performance and goals.                Exercise Goals and Review:   Exercise Goals     Row Name 06/19/23 1008             Exercise Goals   Increase Physical Activity Yes       Intervention Provide advice, education, support and counseling about physical activity/exercise needs.;Develop an individualized exercise prescription for aerobic and resistive training based on initial evaluation findings, risk stratification, comorbidities and  participant's personal goals.       Expected Outcomes Short Term: Attend rehab on a regular basis to increase amount of physical activity.;Long Term: Add in home exercise to make exercise part of routine and to increase amount of physical activity.;Long Term: Exercising regularly at least 3-5 days a week.       Increase Strength and Stamina Yes       Intervention Provide advice, education, support and counseling about physical activity/exercise needs.;Develop an individualized exercise prescription for aerobic and resistive training based on initial evaluation findings, risk stratification, comorbidities and participant's personal goals.       Expected Outcomes Short Term: Increase workloads from initial exercise prescription for resistance, speed, and METs.;Short Term: Perform resistance training exercises routinely during rehab and add in resistance training at home;Long Term: Improve cardiorespiratory fitness, muscular endurance and strength as measured by increased METs and functional capacity ( )  Able to understand and use rate of perceived exertion (RPE) scale Yes       Intervention Provide education and explanation on how to use RPE scale       Expected Outcomes Short Term: Able to use RPE daily in rehab to express subjective intensity level;Long Term:  Able to use RPE to guide intensity level when exercising independently       Able to understand and use Dyspnea scale Yes       Intervention Provide education and explanation on how to use Dyspnea scale       Expected Outcomes Short Term: Able to use Dyspnea scale daily in rehab to express subjective sense of shortness of breath during exertion;Long Term: Able to use Dyspnea scale to guide intensity level when exercising independently       Knowledge and understanding of Target Heart Rate Range (THRR) Yes       Intervention Provide education and explanation of THRR including how the numbers were predicted and where they are located for  reference       Expected Outcomes Short Term: Able to state/look up THRR;Long Term: Able to use THRR to govern intensity when exercising independently;Short Term: Able to use daily as guideline for intensity in rehab       Able to check pulse independently Yes       Intervention Provide education and demonstration on how to check pulse in carotid and radial arteries.;Review the importance of being able to check your own pulse for safety during independent exercise       Expected Outcomes Short Term: Able to explain why pulse checking is important during independent exercise;Long Term: Able to check pulse independently and accurately       Understanding of Exercise Prescription Yes       Intervention Provide education, explanation, and written materials on patient's individual exercise prescription       Expected Outcomes Short Term: Able to explain program exercise prescription;Long Term: Able to explain home exercise prescription to exercise independently                Exercise Goals Re-Evaluation :  Exercise Goals Re-Evaluation     Row Name 06/22/23 1337             Exercise Goal Re-Evaluation   Exercise Goals Review Able to understand and use Dyspnea scale;Understanding of Exercise Prescription;Knowledge and understanding of Target Heart Rate Range (THRR);Able to understand and use rate of perceived exertion (RPE) scale       Comments Reviewed RPE and dyspnea scale, THR and program prescription with pt today.  Pt voiced understanding and was given a copy of goals to take home.       Expected Outcomes Short: Use RPE daily to regulate intensity.  Long: Follow program prescription in THR.                 Discharge Exercise Prescription (Final Exercise Prescription Changes):  Exercise Prescription Changes - 06/19/23 1000       Response to Exercise   Blood Pressure (Admit) 120/60    Blood Pressure (Exercise) 122/60    Blood Pressure (Exit) 118/60    Heart Rate (Admit) 55  bpm    Heart Rate (Exercise) 73 bpm    Heart Rate (Exit) 65 bpm    Oxygen Saturation (Admit) 97 %    Oxygen Saturation (Exercise) 90 %    Oxygen Saturation (Exit) 98 %    Rating of Perceived Exertion (Exercise) 11  Perceived Dyspnea (Exercise) 0             Nutrition:  Target Goals: Understanding of nutrition guidelines, daily intake of sodium 1500mg , cholesterol 200mg , calories 30% from fat and 7% or less from saturated fats, daily to have 5 or more servings of fruits and vegetables.  Biometrics:  Pre Biometrics - 06/19/23 1008       Pre Biometrics   Height 5' 11.5" (1.816 m)    Weight 188 lb 15 oz (85.7 kg)    Waist Circumference 40 inches    Hip Circumference 38 inches    Waist to Hip Ratio 1.05 %    BMI (Calculated) 25.99    Grip Strength 44.3 kg    Single Leg Stand 35.8 seconds              Nutrition Therapy Plan and Nutrition Goals:   Nutrition Assessments:  MEDIFICTS Score Key: >=70 Need to make dietary changes  40-70 Heart Healthy Diet <= 40 Therapeutic Level Cholesterol Diet   Picture Your Plate Scores: <56 Unhealthy dietary pattern with much room for improvement. 41-50 Dietary pattern unlikely to meet recommendations for good health and room for improvement. 51-60 More healthful dietary pattern, with some room for improvement.  >60 Healthy dietary pattern, although there may be some specific behaviors that could be improved.    Nutrition Goals Re-Evaluation:   Nutrition Goals Discharge (Final Nutrition Goals Re-Evaluation):   Psychosocial: Target Goals: Acknowledge presence or absence of significant depression and/or stress, maximize coping skills, provide positive support system. Participant is able to verbalize types and ability to use techniques and skills needed for reducing stress and depression.  Initial Review & Psychosocial Screening:  Initial Psych Review & Screening - 06/19/23 0913       Initial Review   Current issues  with History of Depression;Current Anxiety/Panic;Current Depression;Current Sleep Concerns;Current Psychotropic Meds   Patient severed in the [employer] in [place]. He has PTSD.     Family Dynamics   Good Support System? Yes      Barriers   Psychosocial barriers to participate in program The patient should benefit from training in stress management and relaxation.;There are no identifiable barriers or psychosocial needs.      Screening Interventions   Interventions Encouraged to exercise;Provide feedback about the scores to participant;To provide support and resources with identified psychosocial needs    Expected Outcomes Short Term goal: Utilizing psychosocial counselor, staff and physician to assist with identification of specific Stressors or current issues interfering with healing process. Setting desired goal for each stressor or current issue identified.;Long Term Goal: Stressors or current issues are controlled or eliminated.;Short Term goal: Identification and review with participant of any Quality of Life or Depression concerns found by scoring the questionnaire.;Long Term goal: The participant improves quality of Life and PHQ9 Scores as seen by post scores and/or verbalization of changes             Quality of Life Scores:  Scores of 19 and below usually indicate a poorer quality of life in these areas.  A difference of  2-3 points is a clinically meaningful difference.  A difference of 2-3 points in the total score of the Quality of Life Index has been associated with significant improvement in overall quality of life, self-image, physical symptoms, and general health in studies assessing change in quality of life.  PHQ-9: Review Flowsheet       06/19/2023  Depression screen PHQ 2/9  Decreased Interest 2  Down,  Depressed, Hopeless 3  PHQ - 2 Score 5  Altered sleeping 1  Tired, decreased energy 3  Change in appetite 0  Feeling bad or failure about yourself  3  Trouble  concentrating 2  Moving slowly or fidgety/restless 2  Suicidal thoughts 0  PHQ-9 Score 16  Difficult doing work/chores Somewhat difficult   Interpretation of Total Score  Total Score Depression Severity:  1-4 = Minimal depression, 5-9 = Mild depression, 10-14 = Moderate depression, 15-19 = Moderately severe depression, 20-27 = Severe depression   Psychosocial Evaluation and Intervention:  Psychosocial Evaluation - 06/19/23 0943       Psychosocial Evaluation & Interventions   Interventions Stress management education;Relaxation education;Encouraged to exercise with the program and follow exercise prescription    Comments Patient was referred to CR from the [place] with ischemic HD and stent placement 2/17. He had 3 stents placed at [facility] and has a history of MI with 4 stents placed some years ago. He severed 4 years in the [place] in [place]. His initial PHQ-9 score was 18. He says he does have some depression/aneixty and PTSD. He is followed by behavorial health at the [facility] q 2 weeks and takes Alprazolam. He feels his psychosocial issues are managed well. He had prostate cancer several years ago and was treated with surgery, radiation and lupron. 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 He used to exercise and has equipment at home for exercise but says he just does not have the energy to do anything right now. He reports trouble both getting to sleep and staying asleep. He likes to work with Scientist, physiological and he likes to fix things. 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 His wife is his main support along with her 2 sons. He seems motivated to participate in the program. He wants to start with 2 days/week and may increase to 3. He has also been referred to PT/OT by the [facility] and is not sure yet what this will entail. His main goals for the program are to get his muscle mass back; improve  his SOB and energy and get back to exercising at home and doing the things he enjoys. He has no barriers identified to complete the program.    Expected Outcomes Short Term: Patient will start the program and attend consistently. Long Term: Patient will complete the program meeting personal goals.    Continue Psychosocial Services  Follow up required by staff             Psychosocial Re-Evaluation:   Psychosocial Discharge (Final Psychosocial Re-Evaluation):   Vocational Rehabilitation: Provide vocational rehab assistance to qualifying candidates.   Vocational Rehab Evaluation & Intervention:  Vocational Rehab - 06/19/23 0912       Initial Vocational Rehab Evaluation & Intervention   Assessment shows need for Vocational Rehabilitation No      Vocational Rehab Re-Evaulation   Comments Patient is retired.             Education: Education Goals: Education classes will be provided on a weekly basis, covering required topics. Participant will state understanding/return demonstration of topics presented.  Learning Barriers/Preferences:  Learning Barriers/Preferences - 06/19/23 1610       Learning Barriers/Preferences   Learning Barriers Sight   Has difficulty with close vision due to CVA several years ago.   Learning Preferences  Skilled Demonstration;Pictoral             Education Topics: Hypertension, Hypertension Reduction -Define heart disease and high blood pressure. Discus how high blood pressure affects the body and ways to reduce high blood pressure.   Exercise and Your Heart -Discuss why it is important to exercise, the FITT principles of exercise, normal and abnormal responses to exercise, and how to exercise safely.   Angina -Discuss definition of angina, causes of angina, treatment of angina, and how to decrease risk of having angina.   Cardiac Medications -Review what the following cardiac medications are used for, how they affect the body, and  side effects that may occur when taking the medications.  Medications include Aspirin, Beta blockers, calcium channel blockers, ACE Inhibitors, angiotensin receptor blockers, diuretics, digoxin, and antihyperlipidemics.   Congestive Heart Failure -Discuss the definition of CHF, how to live with CHF, the signs and symptoms of CHF, and how keep track of weight and sodium intake. Flowsheet Row CARDIAC REHAB PHASE II EXERCISE from 06/29/2023 in [place] CARDIAC REHABILITATION  Date 06/29/23  Educator [facility]  Instruction Review Code 1- Verbalizes Understanding       Heart Disease and Intimacy -Discus the effect sexual activity has on the heart, how changes occur during intimacy as we age, and safety during sexual activity.   Smoking Cessation / COPD -Discuss different methods to quit smoking, the health benefits of quitting smoking, and the definition of COPD.   Nutrition I: Fats -Discuss the types of cholesterol, what cholesterol does to the heart, and how cholesterol levels can be controlled.   Nutrition II: Labels -Discuss the different components of food labels and how to read food label   Heart Parts/Heart Disease and PAD -Discuss the anatomy of the heart, the pathway of blood circulation through the heart, and these are affected by heart disease.   Stress I: Signs and Symptoms -Discuss the causes of stress, how stress may lead to anxiety and depression, and ways to limit stress. Flowsheet Row CARDIAC REHAB PHASE II EXERCISE from 06/29/2023 in [place] CARDIAC REHABILITATION  Date 06/22/23  Educator DJ  Instruction Review Code 1- Verbalizes Understanding       Stress II: Relaxation -Discuss different types of relaxation techniques to limit stress.   Warning Signs of Stroke / TIA -Discuss definition of a stroke, what the signs and symptoms are of a stroke, and how to identify when someone is having stroke.   Knowledge Questionnaire Score:   Core Components/Risk  Factors/Patient Goals at Admission:  Personal Goals and Risk Factors at Admission - 06/19/23 0913       Core Components/Risk Factors/Patient Goals on Admission    Weight Management Weight Maintenance    Improve shortness of breath with ADL's Yes    Intervention Provide education, individualized exercise plan and daily activity instruction to help decrease symptoms of SOB with activities of daily living.    Expected Outcomes Short Term: Improve cardiorespiratory fitness to achieve a reduction of symptoms when performing ADLs;Long Term: Be able to perform more ADLs without symptoms or delay the onset of symptoms    Hypertension Yes    Intervention Provide education on lifestyle modifcations including regular physical activity/exercise, weight management, moderate sodium restriction and increased consumption of fresh fruit, vegetables, and low fat dairy, alcohol moderation, and smoking cessation.;Monitor prescription use compliance.    Expected Outcomes Short Term: Continued assessment and intervention until BP is < 140/83mm HG in hypertensive participants. < 130/80mm HG in hypertensive participants  with diabetes, heart failure or chronic kidney disease.;Long Term: Maintenance of blood pressure at goal levels.    Lipids Yes    Intervention Provide education and support for participant on nutrition & aerobic/resistive exercise along with prescribed medications to achieve LDL 70mg , HDL >40mg .    Expected Outcomes Short Term: Participant states understanding of desired cholesterol values and is compliant with medications prescribed. Participant is following exercise prescription and nutrition guidelines.;Long Term: Cholesterol controlled with medications as prescribed, with individualized exercise RX and with personalized nutrition plan. Value goals: LDL < 70mg , HDL > 40 mg.             Core Components/Risk Factors/Patient Goals Review:    Core Components/Risk Factors/Patient Goals at Discharge  (Final Review):    ITP Comments:  ITP Comments     Row Name 06/19/23 1116 06/22/23 1337 07/03/23 0800 07/05/23 1146     ITP Comments Patient arrived for 1st visit/orientation/education at 0800. Patient was referred to CR by Dr. Joycelyn Rua at the [facility] due to Ischemic Heart Disease and stent placement. During orientation advised patient on arrival and appointment times what to wear, what to do before, during and after exercise. Reviewed attendance and class policy.  Pt is scheduled to return Cardiac Rehab on 06/20/23  at 1330. Pt was advised to come to class 15 minutes before class starts.  Discussed RPE/Dpysnea scales. Patient participated in warm up stretches. Patient was able to complete 6 minute walk test.  Telemetry:NSR. Patient was measured for the equipment. Discussed equipment safety with patient. Took patient pre-anthropometric measurements. Patient finished visit at 1013. First full day of exercise!  Patient was oriented to gym and equipment including functions, settings, policies, and procedures.  Patient's individual exercise prescription and treatment plan were reviewed.  All starting workloads were established based on the results of the 6 minute walk test done at initial orientation visit.  The plan for exercise progression was also introduced and progression will be customized based on patient's performance and goals. Sent Progress report and ITP to [place] via [employer] routing 07/03/23. 30 day review completed. ITP sent to Dr. Dina Rich, Medical Director of Cardiac Rehab. Continue with ITP unless changes are made by physician.             Comments: 30 day review

## 2023-07-06 ENCOUNTER — Encounter (HOSPITAL_COMMUNITY)
Admission: RE | Admit: 2023-07-06 | Discharge: 2023-07-06 | Disposition: A | Discharge status: Home / Self Care | Source: Ambulatory Visit | Attending: Family Medicine | Admitting: Family Medicine

## 2023-07-06 DIAGNOSIS — Z955 Presence of coronary angioplasty implant and graft: Secondary | ICD-10-CM

## 2023-07-06 DIAGNOSIS — I259 Chronic ischemic heart disease, unspecified: Secondary | ICD-10-CM | POA: Diagnosis not present

## 2023-07-06 NOTE — Progress Notes (Signed)
 Daily Session Note  Patient Details  Name: Trevor Branch MRN: [id number] Date of Birth: [birth year] Referring Provider:   Flowsheet Row CARDIAC REHAB PHASE II ORIENTATION from 06/19/2023 in [facility] CARDIAC REHABILITATION  Referring Provider Carleene Cooper MD       Encounter Date: 07/06/2023  Check In:  Session Check In - 07/06/23 1330       Check-In   Supervising physician immediately available to respond to emergencies See telemetry face sheet for immediately available MD    Location [facility]    Staff Present Ross Ludwig, BS, Exercise Physiologist;Hillary Leonidas Romberg BSN, RN    Virtual Visit No    Medication changes reported     No    Fall or balance concerns reported    No    Tobacco Cessation No Change    Warm-up and Cool-down Performed on first and last piece of equipment    Resistance Training Performed Yes    VAD Patient? No    PAD/SET Patient? No      Pain Assessment   Currently in Pain? No/denies    Multiple Pain Sites No             Capillary Blood Glucose: No results found for this or any previous visit (from the past 24 hours).    Social History   Tobacco Use  Smoking Status Never  Smokeless Tobacco Never    Goals Met:  Independence with exercise equipment Exercise tolerated well No report of concerns or symptoms today Strength training completed today  Goals Unmet:  Not Applicable  Comments: Pt able to follow exercise prescription today without complaint.  Will continue to monitor for progression.

## 2023-07-11 ENCOUNTER — Encounter (HOSPITAL_COMMUNITY)
Admission: RE | Admit: 2023-07-11 | Discharge: 2023-07-11 | Disposition: A | Discharge status: Home / Self Care | Source: Ambulatory Visit | Attending: Family Medicine | Admitting: Family Medicine

## 2023-07-11 DIAGNOSIS — I259 Chronic ischemic heart disease, unspecified: Secondary | ICD-10-CM | POA: Diagnosis present

## 2023-07-11 DIAGNOSIS — Z955 Presence of coronary angioplasty implant and graft: Secondary | ICD-10-CM | POA: Insufficient documentation

## 2023-07-11 NOTE — Progress Notes (Signed)
 Daily Session Note  Patient Details  Name: AMY BELLOSO MRN: [id number] Date of Birth: 1945-10-15 Referring Provider:   Flowsheet Row CARDIAC REHAB PHASE II ORIENTATION from 06/19/2023 in [facility] CARDIAC REHABILITATION  Referring Provider Carleene Cooper MD       Encounter Date: 07/11/2023  Check In:  Session Check In - 07/11/23 1332       Check-In   Supervising physician immediately available to respond to emergencies See telemetry face sheet for immediately available MD    Location [facility]    Staff Present Sherrye Payor, RN;Jessica [place], MA, RCEP, CCRP, CCET;Heather Fredric Mare, BS, Exercise Physiologist;Karriem Muench Roseanne [place], BSN, RN, WTA-C    Virtual Visit No    Medication changes reported     No    Fall or balance concerns reported    No    Tobacco Cessation No Change    Warm-up and Cool-down Performed on first and last piece of equipment    Resistance Training Performed Yes    VAD Patient? No    PAD/SET Patient? No      Pain Assessment   Currently in Pain? No/denies             Capillary Blood Glucose: No results found for this or any previous visit (from the past 24 hours).    Social History   Tobacco Use  Smoking Status Never  Smokeless Tobacco Never    Goals Met:  Independence with exercise equipment No report of concerns or symptoms today Strength training completed today  Goals Unmet:  Not Applicable  Comments: Pt able to follow exercise prescription today without complaint.  Will continue to monitor for progression.

## 2023-07-13 ENCOUNTER — Encounter (HOSPITAL_COMMUNITY)
Admission: RE | Admit: 2023-07-13 | Discharge: 2023-07-13 | Disposition: A | Discharge status: Home / Self Care | Source: Ambulatory Visit | Attending: Family Medicine

## 2023-07-13 DIAGNOSIS — I259 Chronic ischemic heart disease, unspecified: Secondary | ICD-10-CM | POA: Diagnosis not present

## 2023-07-13 DIAGNOSIS — Z955 Presence of coronary angioplasty implant and graft: Secondary | ICD-10-CM

## 2023-07-13 DIAGNOSIS — I255 Ischemic cardiomyopathy: Secondary | ICD-10-CM | POA: Diagnosis not present

## 2023-07-13 DIAGNOSIS — Z1330 Encounter for screening examination for mental health and behavioral disorders, unspecified: Secondary | ICD-10-CM | POA: Diagnosis not present

## 2023-07-13 DIAGNOSIS — I2510 Atherosclerotic heart disease of native coronary artery without angina pectoris: Secondary | ICD-10-CM | POA: Diagnosis not present

## 2023-07-13 DIAGNOSIS — E782 Mixed hyperlipidemia: Secondary | ICD-10-CM | POA: Diagnosis not present

## 2023-07-13 NOTE — Progress Notes (Signed)
 Daily Session Note  Patient Details  Name: Trevor Branch MRN: [id number] Date of Birth: 08/28/1945 Referring Provider:   Flowsheet Row CARDIAC REHAB PHASE II ORIENTATION from 06/19/2023 in [facility] CARDIAC REHABILITATION  Referring Provider Carleene Cooper MD       Encounter Date: 07/13/2023  Check In:  Session Check In - 07/13/23 1323       Check-In   Supervising physician immediately available to respond to emergencies See telemetry face sheet for immediately available MD    Location [facility]    Staff Present Avanell Shackleton BSN, RN;Heather [place], [place], Exercise Physiologist;Tina Jake Shark, RN    Virtual Visit No    Medication changes reported     Yes    Comments Pt was taken off Isosorbide    Fall or balance concerns reported    No    Tobacco Cessation No Change    Warm-up and Cool-down Performed on first and last piece of equipment    Resistance Training Performed Yes    VAD Patient? No    PAD/SET Patient? No      Pain Assessment   Currently in Pain? No/denies    Multiple Pain Sites No             Capillary Blood Glucose: No results found for this or any previous visit (from the past 24 hours).    Social History   Tobacco Use  Smoking Status Never  Smokeless Tobacco Never    Goals Met:  Independence with exercise equipment Exercise tolerated well No report of concerns or symptoms today Strength training completed today  Goals Unmet:  Not Applicable  Comments: .Pt able to follow exercise prescription today without complaint.  Will continue to monitor for progression.

## 2023-07-18 ENCOUNTER — Encounter (HOSPITAL_COMMUNITY)

## 2023-07-20 ENCOUNTER — Encounter (HOSPITAL_COMMUNITY)

## 2023-07-25 ENCOUNTER — Encounter (HOSPITAL_COMMUNITY)
Admission: RE | Admit: 2023-07-25 | Discharge: 2023-07-25 | Disposition: A | Discharge status: Home / Self Care | Source: Ambulatory Visit | Attending: Family Medicine | Admitting: Family Medicine

## 2023-07-25 DIAGNOSIS — Z955 Presence of coronary angioplasty implant and graft: Secondary | ICD-10-CM

## 2023-07-25 DIAGNOSIS — I259 Chronic ischemic heart disease, unspecified: Secondary | ICD-10-CM

## 2023-07-25 NOTE — Progress Notes (Signed)
 Daily Session Note  Patient Details  Name: YOUSIF EDELSON MRN: [id number] Date of Birth: [birth year] Referring Provider:   Flowsheet Row CARDIAC REHAB PHASE II ORIENTATION from 06/19/2023 in [facility] CARDIAC REHABILITATION  Referring Provider Miriam American MD       Encounter Date: 07/25/2023  Check In:  Session Check In - 07/25/23 1330       Check-In   Supervising physician immediately available to respond to emergencies See telemetry face sheet for immediately available MD    Location [facility]    Staff Present Clotilda Danish, BS, Exercise Physiologist;Laureen Rachell Budge, RRT, CPFT;Brittany Annette Barters, BSN, RN, WTA-C;Adis Sturgill, RN    Virtual Visit No    Medication changes reported     No    Fall or balance concerns reported    No    Warm-up and Cool-down Performed on first and last piece of equipment    Resistance Training Performed Yes    VAD Patient? No    PAD/SET Patient? No      Pain Assessment   Currently in Pain? No/denies    Multiple Pain Sites No             Capillary Blood Glucose: No results found for this or any previous visit (from the past 24 hours).    Social History   Tobacco Use  Smoking Status Never  Smokeless Tobacco Never    Goals Met:  Independence with exercise equipment Exercise tolerated well No report of concerns or symptoms today Strength training completed today  Goals Unmet:  Not Applicable  Comments: Pt able to follow exercise prescription today without complaint.  Will continue to monitor for progression.

## 2023-07-27 ENCOUNTER — Encounter (HOSPITAL_COMMUNITY)
Admission: RE | Admit: 2023-07-27 | Discharge: 2023-07-27 | Disposition: A | Discharge status: Home / Self Care | Source: Ambulatory Visit | Attending: Family Medicine

## 2023-07-27 DIAGNOSIS — I259 Chronic ischemic heart disease, unspecified: Secondary | ICD-10-CM

## 2023-07-27 DIAGNOSIS — Z955 Presence of coronary angioplasty implant and graft: Secondary | ICD-10-CM

## 2023-07-27 NOTE — Progress Notes (Signed)
 Daily Session Note  Patient Details  Name: Trevor Branch MRN: [id number] Date of Birth: 1946/01/12 Referring Provider:   Flowsheet Row CARDIAC REHAB PHASE II ORIENTATION from 06/19/2023 in [facility] CARDIAC REHABILITATION  Referring Provider Miriam American MD       Encounter Date: 07/27/2023  Check In:  Session Check In - 07/27/23 1330       Check-In   Supervising physician immediately available to respond to emergencies See telemetry face sheet for immediately available MD    Location [facility]    Staff Present Clotilda Danish, BS, Exercise Physiologist;Hillary Lennie Ra BSN, RN;Brooke Rollin Clock, RN;Other    Virtual Visit No    Medication changes reported     No    Fall or balance concerns reported    No    Warm-up and Cool-down Performed on first and last piece of equipment    Resistance Training Performed Yes    VAD Patient? No    PAD/SET Patient? No      Pain Assessment   Currently in Pain? No/denies    Multiple Pain Sites No             Capillary Blood Glucose: No results found for this or any previous visit (from the past 24 hours).    Social History   Tobacco Use  Smoking Status Never  Smokeless Tobacco Never    Goals Met:  Independence with exercise equipment Exercise tolerated well No report of concerns or symptoms today Strength training completed today  Goals Unmet:  Not Applicable  Comments: Pt able to follow exercise prescription today without complaint.  Will continue to monitor for progression.

## 2023-08-01 ENCOUNTER — Encounter (HOSPITAL_COMMUNITY)

## 2023-08-02 ENCOUNTER — Encounter (HOSPITAL_COMMUNITY): Payer: Self-pay | Admitting: *Deleted

## 2023-08-02 DIAGNOSIS — I259 Chronic ischemic heart disease, unspecified: Secondary | ICD-10-CM

## 2023-08-02 DIAGNOSIS — Z955 Presence of coronary angioplasty implant and graft: Secondary | ICD-10-CM

## 2023-08-02 NOTE — Progress Notes (Signed)
 Cardiac Individual Treatment Plan  Patient Details  Name: Trevor Branch MRN: [id number] Date of Birth: [birth year] Referring Provider:   Flowsheet Row CARDIAC REHAB PHASE II ORIENTATION from 06/19/2023 in [facility] CARDIAC REHABILITATION  Referring Provider Miriam American MD       Initial Encounter Date:  Flowsheet Row CARDIAC REHAB PHASE II ORIENTATION from 06/19/2023 in [place] CARDIAC REHABILITATION  Date 06/19/23       Visit Diagnosis: Chronic ischemic heart disease  Status post coronary artery stent placement  Patient's Home Medications on Admission:  Current Outpatient Medications:    acetaminophen  (TYLENOL ) 500 MG tablet, Take 1,000 mg by mouth daily as needed for moderate pain. (Patient not taking: Reported on 06/19/2023), Disp: , Rfl:    ALPRAZolam  (XANAX ) 0.5 MG tablet, Take 0.5 mg by mouth as needed for anxiety., Disp: , Rfl:    aspirin  EC 81 MG tablet, Take 1 tablet (81 mg total) by mouth daily. (Patient taking differently: Take 81 mg by mouth every evening.), Disp: 90 tablet, Rfl: 3   aspirin -acetaminophen -caffeine (EXCEDRIN MIGRAINE) 250-250-65 MG tablet, Take 2 tablets by mouth daily as needed for headache or migraine. (Patient not taking: Reported on 06/19/2023), Disp: , Rfl:    clopidogrel  (PLAVIX ) 75 MG tablet, Take 75 mg by mouth daily., Disp: , Rfl:    cycloSPORINE  (RESTASIS ) 0.05 % ophthalmic emulsion, Place 1 drop into both eyes 2 (two) times daily., Disp: , Rfl:    ezetimibe  (ZETIA ) 10 MG tablet, Take 10 mg by mouth daily., Disp: , Rfl:    famotidine  (PEPCID ) 20 MG tablet, Take 20 mg by mouth 3 (three) times daily with meals. (Patient not taking: Reported on 06/19/2023), Disp: , Rfl:    isosorbide mononitrate (IMDUR) 30 MG 24 hr tablet, Take 30 mg by mouth daily., Disp: , Rfl:    latanoprost  (XALATAN ) 0.005 % ophthalmic solution, Place 1 drop into both eyes at bedtime. , Disp: , Rfl:    nitroGLYCERIN  (NITROSTAT ) 0.4 MG SL tablet, Place 0.4 mg under the tongue  every 5 (five) minutes as needed for chest pain., Disp: , Rfl:    omeprazole  (PRILOSEC) 40 MG capsule, Take 40 mg by mouth daily., Disp: , Rfl:    Polyvinyl Alcohol-Povidone (REFRESH OP), Apply 1 drop to eye daily as needed (dry eyes). (Patient not taking: Reported on 06/19/2023), Disp: , Rfl:    timolol  (TIMOPTIC ) 0.5 % ophthalmic solution, Place 1 drop into both eyes daily., Disp: , Rfl:   Past Medical History: Past Medical History:  Diagnosis Date   Anxiety    Arthritis    shoulder    CAD (coronary artery disease)    a. NSTEMI 3/17 - s/p DES x 2 to mid LAD; staged PCI with DES to OM1; residual CAD - mLCX 40%, pRCA 30%, mRCA 20%    Cancer (HCC)    skin cancer and prostate cancer    Complication of anesthesia    stopped breathing during surgery age 78 , 2009- SVT ablation - quit breathinig per patient anesthesia had to be reversed per patient    Depression    GERD (gastroesophageal reflux disease)    Glaucoma    Headache    history of migraines    History of kidney stones    History of non-ST elevation myocardial infarction (NSTEMI) 06/2015   Hyperlipidemia    Ischemic cardiomyopathy    a. Echo 06/23/15 - EF 35-40%, ant-septal, inferoseptal, apical AK, Gr 1 DD, trivial AI >>  b. Echo 5/17 - EF  55-60%, no RWMA, Gr 1 DD   Myocardial infarction (HCC)    Panic disorder    PTSD (post-traumatic stress disorder)    panic attacks   SVT (supraventricular tachycardia) (HCC)    s/p prior RFCA    Tobacco Use: Social History   Tobacco Use  Smoking Status Never  Smokeless Tobacco Never    Labs: Review Flowsheet  More data exists      Latest Ref Rng & Units 12/09/2006 10/03/2007 06/20/2015 12/31/2015 12/16/2016  Labs for ITP Cardiac and Pulmonary Rehab  Cholestrol 100 - 199 mg/dL - 161  096  045  409   LDL (calc) 0 - 99 mg/dL - - 811  914  782   Direct LDL mg/dL - 956.2  - - -  HDL-C >13 mg/dL - 08.6  44  53  47   Trlycerides 0 - 149 mg/dL - 578  71  469  629   Hemoglobin A1c 4.8 - 5.6  % - - 5.7  - -  Bicarbonate - 27.2  - - - -  TCO2 - 29  - - - -    Capillary Blood Glucose: Lab Results  Component Value Date   GLUCAP 99 06/20/2015     Exercise Target Goals: Exercise Program Goal: Individual exercise prescription set using results from initial 6 min walk test and THRR while considering  patient's activity barriers and safety.   Exercise Prescription Goal: Starting with aerobic activity 30 plus minutes a day, 3 days per week for initial exercise prescription. Provide home exercise prescription and guidelines that participant acknowledges understanding prior to discharge.  Activity Barriers & Risk Stratification:  Activity Barriers & Cardiac Risk Stratification - 06/19/23 0857       Activity Barriers & Cardiac Risk Stratification   Activity Barriers Shortness of Breath;Chest Pain/Angina;Balance Concerns;Deconditioning    Cardiac Risk Stratification Moderate             6 Minute Walk:  6 Minute Walk     Row Name 06/19/23 1005         6 Minute Walk   Phase Initial     Distance 1500 feet     Walk Time 6 minutes     # of Rest Breaks 0     MPH 2.84     METS 2.67     RPE 11     Perceived Dyspnea  0     VO2 Peak 9.36     Symptoms No     Resting HR 55 bpm     Resting BP 120/60     Resting Oxygen Saturation  97 %     Exercise Oxygen Saturation  during 6 min walk 90 %     Max Ex. HR 73 bpm     Max Ex. BP 122/60     2 Minute Post BP 118/60              Oxygen Initial Assessment:   Oxygen Re-Evaluation:   Oxygen Discharge (Final Oxygen Re-Evaluation):   Initial Exercise Prescription:  Initial Exercise Prescription - 06/19/23 1000       Date of Initial Exercise RX and Referring Provider   Date 06/19/23    Referring Provider Miriam American MD      Treadmill   MPH 1.4    Grade 0    Minutes 15    METs 2.07      REL-XR   Level 2    Speed 60    Minutes 15  METs 2      Prescription Details   Frequency (times per week) 2     Duration Progress to 30 minutes of continuous aerobic without signs/symptoms of physical distress      Intensity   THRR 40-80% of Max Heartrate 90-125    Ratings of Perceived Exertion 11-13    Perceived Dyspnea 0-4      Resistance Training   Training Prescription Yes    Weight 4    Reps 10-15             Perform Capillary Blood Glucose checks as needed.  Exercise Prescription Changes:   Exercise Prescription Changes     Row Name 06/19/23 1000 07/04/23 1500 07/27/23 1500         Response to Exercise   Blood Pressure (Admit) 120/60 120/52 128/62     Blood Pressure (Exercise) 122/60 150/62 --     Blood Pressure (Exit) 118/60 120/60 104/50     Heart Rate (Admit) 55 bpm 58 bpm 59 bpm     Heart Rate (Exercise) 73 bpm 112 bpm 109 bpm     Heart Rate (Exit) 65 bpm 67 bpm 89 bpm     Oxygen Saturation (Admit) 97 % -- --     Oxygen Saturation (Exercise) 90 % -- --     Oxygen Saturation (Exit) 98 % -- --     Rating of Perceived Exertion (Exercise) 11 11 12      Perceived Dyspnea (Exercise) 0 -- --     Duration -- Continue with 30 min of aerobic exercise without signs/symptoms of physical distress. Continue with 30 min of aerobic exercise without signs/symptoms of physical distress.     Intensity -- THRR unchanged THRR unchanged       Progression   Progression -- Continue to progress workloads to maintain intensity without signs/symptoms of physical distress. Continue to progress workloads to maintain intensity without signs/symptoms of physical distress.       Resistance Training   Training Prescription -- Yes Yes     Weight -- 4 4     Reps -- 10-15 10-15       Treadmill   MPH -- 4 3.5     Grade -- 0.5 0     Minutes -- 15 15     METs -- 4.34 3.68       REL-XR   Level -- 5 5     Speed -- 59 59     Minutes -- 15 15     METs -- 3.4 4.8              Exercise Comments:   Exercise Comments     Row Name 06/22/23 1337           Exercise Comments First full  day of exercise!  Patient was oriented to gym and equipment including functions, settings, policies, and procedures.  Patient's individual exercise prescription and treatment plan were reviewed.  All starting workloads were established based on the results of the 6 minute walk test done at initial orientation visit.  The plan for exercise progression was also introduced and progression will be customized based on patient's performance and goals.                Exercise Goals and Review:   Exercise Goals     Row Name 06/19/23 1008             Exercise Goals   Increase Physical Activity Yes  Intervention Provide advice, education, support and counseling about physical activity/exercise needs.;Develop an individualized exercise prescription for aerobic and resistive training based on initial evaluation findings, risk stratification, comorbidities and participant's personal goals.       Expected Outcomes Short Term: Attend rehab on a regular basis to increase amount of physical activity.;Long Term: Add in home exercise to make exercise part of routine and to increase amount of physical activity.;Long Term: Exercising regularly at least 3-5 days a week.       Increase Strength and Stamina Yes       Intervention Provide advice, education, support and counseling about physical activity/exercise needs.;Develop an individualized exercise prescription for aerobic and resistive training based on initial evaluation findings, risk stratification, comorbidities and participant's personal goals.       Expected Outcomes Short Term: Increase workloads from initial exercise prescription for resistance, speed, and METs.;Short Term: Perform resistance training exercises routinely during rehab and add in resistance training at home;Long Term: Improve cardiorespiratory fitness, muscular endurance and strength as measured by increased METs and functional capacity ( )       Able to understand and use rate  of perceived exertion (RPE) scale Yes       Intervention Provide education and explanation on how to use RPE scale       Expected Outcomes Short Term: Able to use RPE daily in rehab to express subjective intensity level;Long Term:  Able to use RPE to guide intensity level when exercising independently       Able to understand and use Dyspnea scale Yes       Intervention Provide education and explanation on how to use Dyspnea scale       Expected Outcomes Short Term: Able to use Dyspnea scale daily in rehab to express subjective sense of shortness of breath during exertion;Long Term: Able to use Dyspnea scale to guide intensity level when exercising independently       Knowledge and understanding of Target Heart Rate Range (THRR) Yes       Intervention Provide education and explanation of THRR including how the numbers were predicted and where they are located for reference       Expected Outcomes Short Term: Able to state/look up THRR;Long Term: Able to use THRR to govern intensity when exercising independently;Short Term: Able to use daily as guideline for intensity in rehab       Able to check pulse independently Yes       Intervention Provide education and demonstration on how to check pulse in carotid and radial arteries.;Review the importance of being able to check your own pulse for safety during independent exercise       Expected Outcomes Short Term: Able to explain why pulse checking is important during independent exercise;Long Term: Able to check pulse independently and accurately       Understanding of Exercise Prescription Yes       Intervention Provide education, explanation, and written materials on patient's individual exercise prescription       Expected Outcomes Short Term: Able to explain program exercise prescription;Long Term: Able to explain home exercise prescription to exercise independently                Exercise Goals Re-Evaluation :  Exercise Goals Re-Evaluation      Row Name 06/22/23 1337             Exercise Goal Re-Evaluation   Exercise Goals Review Able to understand and use Dyspnea scale;Understanding of Exercise Prescription;Knowledge and  understanding of Target Heart Rate Range (THRR);Able to understand and use rate of perceived exertion (RPE) scale       Comments Reviewed RPE and dyspnea scale, THR and program prescription with pt today.  Pt voiced understanding and was given a copy of goals to take home.       Expected Outcomes Short: Use RPE daily to regulate intensity.  Long: Follow program prescription in THR.                 Discharge Exercise Prescription (Final Exercise Prescription Changes):  Exercise Prescription Changes - 07/27/23 1500       Response to Exercise   Blood Pressure (Admit) 128/62    Blood Pressure (Exit) 104/50    Heart Rate (Admit) 59 bpm    Heart Rate (Exercise) 109 bpm    Heart Rate (Exit) 89 bpm    Rating of Perceived Exertion (Exercise) 12    Duration Continue with 30 min of aerobic exercise without signs/symptoms of physical distress.    Intensity THRR unchanged      Progression   Progression Continue to progress workloads to maintain intensity without signs/symptoms of physical distress.      Resistance Training   Training Prescription Yes    Weight 4    Reps 10-15      Treadmill   MPH 3.5    Grade 0    Minutes 15    METs 3.68      REL-XR   Level 5    Speed 59    Minutes 15    METs 4.8             Nutrition:  Target Goals: Understanding of nutrition guidelines, daily intake of sodium 1500mg , cholesterol 200mg , calories 30% from fat and 7% or less from saturated fats, daily to have 5 or more servings of fruits and vegetables.  Biometrics:  Pre Biometrics - 06/19/23 1008       Pre Biometrics   Height 5' 11.5" (1.816 m)    Weight 188 lb 15 oz (85.7 kg)    Waist Circumference 40 inches    Hip Circumference 38 inches    Waist to Hip Ratio 1.05 %    BMI (Calculated)  25.99    Grip Strength 44.3 kg    Single Leg Stand 35.8 seconds              Nutrition Therapy Plan and Nutrition Goals:   Nutrition Assessments:  MEDIFICTS Score Key: >=70 Need to make dietary changes  40-70 Heart Healthy Diet <= 40 Therapeutic Level Cholesterol Diet   Picture Your Plate Scores: <52 Unhealthy dietary pattern with much room for improvement. 41-50 Dietary pattern unlikely to meet recommendations for good health and room for improvement. 51-60 More healthful dietary pattern, with some room for improvement.  >60 Healthy dietary pattern, although there may be some specific behaviors that could be improved.    Nutrition Goals Re-Evaluation:   Nutrition Goals Discharge (Final Nutrition Goals Re-Evaluation):   Psychosocial: Target Goals: Acknowledge presence or absence of significant depression and/or stress, maximize coping skills, provide positive support system. Participant is able to verbalize types and ability to use techniques and skills needed for reducing stress and depression.  Initial Review & Psychosocial Screening:  Initial Psych Review & Screening - 06/19/23 0913       Initial Review   Current issues with History of Depression;Current Anxiety/Panic;Current Depression;Current Sleep Concerns;Current Psychotropic Meds   Patient severed in the [employer] in [place]. He  has PTSD.     Family Dynamics   Good Support System? Yes      Barriers   Psychosocial barriers to participate in program The patient should benefit from training in stress management and relaxation.;There are no identifiable barriers or psychosocial needs.      Screening Interventions   Interventions Encouraged to exercise;Provide feedback about the scores to participant;To provide support and resources with identified psychosocial needs    Expected Outcomes Short Term goal: Utilizing psychosocial counselor, staff and physician to assist with identification of specific Stressors or  current issues interfering with healing process. Setting desired goal for each stressor or current issue identified.;Long Term Goal: Stressors or current issues are controlled or eliminated.;Short Term goal: Identification and review with participant of any Quality of Life or Depression concerns found by scoring the questionnaire.;Long Term goal: The participant improves quality of Life and PHQ9 Scores as seen by post scores and/or verbalization of changes             Quality of Life Scores:  Scores of 19 and below usually indicate a poorer quality of life in these areas.  A difference of  2-3 points is a clinically meaningful difference.  A difference of 2-3 points in the total score of the Quality of Life Index has been associated with significant improvement in overall quality of life, self-image, physical symptoms, and general health in studies assessing change in quality of life.  PHQ-9: Review Flowsheet       06/19/2023  Depression screen PHQ 2/9  Decreased Interest 2  Down, Depressed, Hopeless 3  PHQ - 2 Score 5  Altered sleeping 1  Tired, decreased energy 3  Change in appetite 0  Feeling bad or failure about yourself  3  Trouble concentrating 2  Moving slowly or fidgety/restless 2  Suicidal thoughts 0  PHQ-9 Score 16  Difficult doing work/chores Somewhat difficult   Interpretation of Total Score  Total Score Depression Severity:  1-4 = Minimal depression, 5-9 = Mild depression, 10-14 = Moderate depression, 15-19 = Moderately severe depression, 20-27 = Severe depression   Psychosocial Evaluation and Intervention:  Psychosocial Evaluation - 06/19/23 0943       Psychosocial Evaluation & Interventions   Interventions Stress management education;Relaxation education;Encouraged to exercise with the program and follow exercise prescription    Comments Patient was referred to CR from the [place] with ischemic HD and stent placement 2/17. He had 3 stents placed at [facility] and has a  history of MI with 4 stents placed some years ago. He severed 4 years in the [place] in [place]. His initial PHQ-9 score was 18. He says he does have some depression/aneixty and PTSD. He is followed by behavorial health at the [facility] q 2 weeks and takes Alprazolam . He feels his psychosocial issues are managed well. He had prostate cancer several years ago and was treated with surgery, radiation and lupron. 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 He used to exercise and has equipment at home for exercise but says he just does not have the energy to do anything right now. He reports trouble both getting to sleep and staying asleep. He likes to work with Scientist, physiological and he likes to fix things. 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 His wife is his main support along with her 2 sons. He seems  motivated to participate in the program. He wants to start with 2 days/week and may increase to 3. He has also been referred to PT/OT by the [facility] and is not sure yet what this will entail. His main goals for the program are to get his muscle mass back; improve his SOB and energy and get back to exercising at home and doing the things he enjoys. He has no barriers identified to complete the program.    Expected Outcomes Short Term: Patient will start the program and attend consistently. Long Term: Patient will complete the program meeting personal goals.    Continue Psychosocial Services  Follow up required by staff             Psychosocial Re-Evaluation:   Psychosocial Discharge (Final Psychosocial Re-Evaluation):   Vocational Rehabilitation: Provide vocational rehab assistance to qualifying candidates.   Vocational Rehab Evaluation & Intervention:  Vocational Rehab - 06/19/23 0912       Initial Vocational Rehab Evaluation & Intervention   Assessment shows need for Vocational Rehabilitation No       Vocational Rehab Re-Evaulation   Comments Patient is retired.             Education: Education Goals: Education classes will be provided on a weekly basis, covering required topics. Participant will state understanding/return demonstration of topics presented.  Learning Barriers/Preferences:  Learning Barriers/Preferences - 06/19/23 1610       Learning Barriers/Preferences   Learning Barriers Sight   Has difficulty with close vision due to CVA several years ago.   Learning Preferences Skilled Demonstration;Pictoral             Education Topics: Hypertension, Hypertension Reduction -Define heart disease and high blood pressure. Discus how high blood pressure affects the body and ways to reduce high blood pressure.   Exercise and Your Heart -Discuss why it is important to exercise, the FITT principles of exercise, normal and abnormal responses to exercise, and how to exercise safely. Flowsheet Row CARDIAC REHAB PHASE II EXERCISE from 07/27/2023 in [place] CARDIAC REHABILITATION  Date 07/27/23  Educator hb  Instruction Review Code 1- Verbalizes Understanding       Angina -Discuss definition of angina, causes of angina, treatment of angina, and how to decrease risk of having angina.   Cardiac Medications -Review what the following cardiac medications are used for, how they affect the body, and side effects that may occur when taking the medications.  Medications include Aspirin , Beta blockers, calcium channel blockers, ACE Inhibitors, angiotensin receptor blockers, diuretics, digoxin, and antihyperlipidemics. Flowsheet Row CARDIAC REHAB PHASE II EXERCISE from 07/27/2023 in [place] CARDIAC REHABILITATION  Date 07/06/23  Educator hb  Instruction Review Code 1- Verbalizes Understanding       Congestive Heart Failure -Discuss the definition of CHF, how to live with CHF, the signs and symptoms of CHF, and how keep track of weight and sodium intake. Flowsheet  Row CARDIAC REHAB PHASE II EXERCISE from 07/27/2023 in [place] CARDIAC REHABILITATION  Date 06/29/23  Educator [facility]  Instruction Review Code 1- Verbalizes Understanding       Heart Disease and Intimacy -Discus the effect sexual activity has on the heart, how changes occur during intimacy as we age, and safety during sexual activity.   Smoking Cessation / COPD -Discuss different methods to quit smoking, the health benefits of quitting smoking, and the definition of COPD.   Nutrition I: Fats -Discuss the types of cholesterol, what cholesterol does to the heart, and how cholesterol  levels can be controlled.   Nutrition II: Labels -Discuss the different components of food labels and how to read food label   Heart Parts/Heart Disease and PAD -Discuss the anatomy of the heart, the pathway of blood circulation through the heart, and these are affected by heart disease.   Stress I: Signs and Symptoms -Discuss the causes of stress, how stress may lead to anxiety and depression, and ways to limit stress. Flowsheet Row CARDIAC REHAB PHASE II EXERCISE from 07/27/2023 in [place] CARDIAC REHABILITATION  Date 06/22/23  Educator DJ  Instruction Review Code 1- Verbalizes Understanding       Stress II: Relaxation -Discuss different types of relaxation techniques to limit stress.   Warning Signs of Stroke / TIA -Discuss definition of a stroke, what the signs and symptoms are of a stroke, and how to identify when someone is having stroke.   Knowledge Questionnaire Score:   Core Components/Risk Factors/Patient Goals at Admission:  Personal Goals and Risk Factors at Admission - 06/19/23 0913       Core Components/Risk Factors/Patient Goals on Admission    Weight Management Weight Maintenance    Improve shortness of breath with ADL's Yes    Intervention Provide education, individualized exercise plan and daily activity instruction to help decrease symptoms of SOB with activities of  daily living.    Expected Outcomes Short Term: Improve cardiorespiratory fitness to achieve a reduction of symptoms when performing ADLs;Long Term: Be able to perform more ADLs without symptoms or delay the onset of symptoms    Hypertension Yes    Intervention Provide education on lifestyle modifcations including regular physical activity/exercise, weight management, moderate sodium restriction and increased consumption of fresh fruit, vegetables, and low fat dairy, alcohol moderation, and smoking cessation.;Monitor prescription use compliance.    Expected Outcomes Short Term: Continued assessment and intervention until BP is < 140/67mm HG in hypertensive participants. < 130/69mm HG in hypertensive participants with diabetes, heart failure or chronic kidney disease.;Long Term: Maintenance of blood pressure at goal levels.    Lipids Yes    Intervention Provide education and support for participant on nutrition & aerobic/resistive exercise along with prescribed medications to achieve LDL 70mg , HDL >40mg .    Expected Outcomes Short Term: Participant states understanding of desired cholesterol values and is compliant with medications prescribed. Participant is following exercise prescription and nutrition guidelines.;Long Term: Cholesterol controlled with medications as prescribed, with individualized exercise RX and with personalized nutrition plan. Value goals: LDL < 70mg , HDL > 40 mg.             Core Components/Risk Factors/Patient Goals Review:    Core Components/Risk Factors/Patient Goals at Discharge (Final Review):    ITP Comments:  ITP Comments     Row Name 06/19/23 1116 06/22/23 1337 07/03/23 0800 07/05/23 1146 08/02/23 1450   ITP Comments Patient arrived for 1st visit/orientation/education at 0800. Patient was referred to CR by Dr. Wyn Heater at the [facility] due to Ischemic Heart Disease and stent placement. During orientation advised patient on arrival and appointment times what to  wear, what to do before, during and after exercise. Reviewed attendance and class policy.  Pt is scheduled to return Cardiac Rehab on 06/20/23  at 1330. Pt was advised to come to class 15 minutes before class starts.  Discussed RPE/Dpysnea scales. Patient participated in warm up stretches. Patient was able to complete 6 minute walk test.  Telemetry:NSR. Patient was measured for the equipment. Discussed equipment safety with patient. Took patient pre-anthropometric measurements.  Patient finished visit at 1013. First full day of exercise!  Patient was oriented to gym and equipment including functions, settings, policies, and procedures.  Patient's individual exercise prescription and treatment plan were reviewed.  All starting workloads were established based on the results of the 6 minute walk test done at initial orientation visit.  The plan for exercise progression was also introduced and progression will be customized based on patient's performance and goals. Sent Progress report and ITP to [place] via [facility] routing 07/03/23. 30 day review completed. ITP sent to Dr. Armida Lander, Medical Director of Cardiac Rehab. Continue with ITP unless changes are made by physician. 30 day review completed. ITP sent to Dr. Armida Lander, Medical Director of Cardiac Rehab. Continue with ITP unless changes are made by physician.            Comments: 30 day review

## 2023-08-03 ENCOUNTER — Encounter (HOSPITAL_COMMUNITY)

## 2023-08-04 ENCOUNTER — Encounter (HOSPITAL_COMMUNITY): Payer: Self-pay | Admitting: *Deleted

## 2023-08-04 DIAGNOSIS — Z955 Presence of coronary angioplasty implant and graft: Secondary | ICD-10-CM

## 2023-08-04 DIAGNOSIS — I259 Chronic ischemic heart disease, unspecified: Secondary | ICD-10-CM

## 2023-08-04 NOTE — Progress Notes (Signed)
 Cardiac Individual Treatment Plan  Patient Details  Name: Trevor Branch MRN: [id number] Date of Birth: 08/14/1945 Referring Provider:   Flowsheet Row CARDIAC REHAB PHASE II ORIENTATION from 06/19/2023 in [facility] CARDIAC REHABILITATION  Referring Provider Miriam American MD       Initial Encounter Date:  Flowsheet Row CARDIAC REHAB PHASE II ORIENTATION from 06/19/2023 in [place] CARDIAC REHABILITATION  Date 06/19/23       Visit Diagnosis: Chronic ischemic heart disease  Status post coronary artery stent placement  Patient's Home Medications on Admission:  Current Outpatient Medications:    acetaminophen  (TYLENOL ) 500 MG tablet, Take 1,000 mg by mouth daily as needed for moderate pain. (Patient not taking: Reported on 06/19/2023), Disp: , Rfl:    ALPRAZolam  (XANAX ) 0.5 MG tablet, Take 0.5 mg by mouth as needed for anxiety., Disp: , Rfl:    aspirin  EC 81 MG tablet, Take 1 tablet (81 mg total) by mouth daily. (Patient taking differently: Take 81 mg by mouth every evening.), Disp: 90 tablet, Rfl: 3   aspirin -acetaminophen -caffeine (EXCEDRIN MIGRAINE) 250-250-65 MG tablet, Take 2 tablets by mouth daily as needed for headache or migraine. (Patient not taking: Reported on 06/19/2023), Disp: , Rfl:    clopidogrel  (PLAVIX ) 75 MG tablet, Take 75 mg by mouth daily., Disp: , Rfl:    cycloSPORINE  (RESTASIS ) 0.05 % ophthalmic emulsion, Place 1 drop into both eyes 2 (two) times daily., Disp: , Rfl:    ezetimibe  (ZETIA ) 10 MG tablet, Take 10 mg by mouth daily., Disp: , Rfl:    famotidine  (PEPCID ) 20 MG tablet, Take 20 mg by mouth 3 (three) times daily with meals. (Patient not taking: Reported on 06/19/2023), Disp: , Rfl:    isosorbide mononitrate (IMDUR) 30 MG 24 hr tablet, Take 30 mg by mouth daily., Disp: , Rfl:    latanoprost  (XALATAN ) 0.005 % ophthalmic solution, Place 1 drop into both eyes at bedtime. , Disp: , Rfl:    nitroGLYCERIN  (NITROSTAT ) 0.4 MG SL tablet, Place 0.4 mg under the tongue  every 5 (five) minutes as needed for chest pain., Disp: , Rfl:    omeprazole  (PRILOSEC) 40 MG capsule, Take 40 mg by mouth daily., Disp: , Rfl:    Polyvinyl Alcohol-Povidone (REFRESH OP), Apply 1 drop to eye daily as needed (dry eyes). (Patient not taking: Reported on 06/19/2023), Disp: , Rfl:    timolol  (TIMOPTIC ) 0.5 % ophthalmic solution, Place 1 drop into both eyes daily., Disp: , Rfl:   Past Medical History: Past Medical History:  Diagnosis Date   Anxiety    Arthritis    shoulder    CAD (coronary artery disease)    a. NSTEMI 3/17 - s/p DES x 2 to mid LAD; staged PCI with DES to OM1; residual CAD - mLCX 40%, pRCA 30%, mRCA 20%    Cancer (HCC)    skin cancer and prostate cancer    Complication of anesthesia    stopped breathing during surgery age 78 , 2009- SVT ablation - quit breathinig per patient anesthesia had to be reversed per patient    Depression    GERD (gastroesophageal reflux disease)    Glaucoma    Headache    history of migraines    History of kidney stones    History of non-ST elevation myocardial infarction (NSTEMI) 06/2015   Hyperlipidemia    Ischemic cardiomyopathy    a. Echo 06/23/15 - EF 35-40%, ant-septal, inferoseptal, apical AK, Gr 1 DD, trivial AI >>  b. Echo 5/17 - EF  55-60%, no RWMA, Gr 1 DD   Myocardial infarction (HCC)    Panic disorder    PTSD (post-traumatic stress disorder)    panic attacks   SVT (supraventricular tachycardia) (HCC)    s/p prior RFCA    Tobacco Use: Social History   Tobacco Use  Smoking Status Never  Smokeless Tobacco Never    Labs: Review Flowsheet  More data exists      Latest Ref Rng & Units 12/09/2006 10/03/2007 06/20/2015 12/31/2015 12/16/2016  Labs for ITP Cardiac and Pulmonary Rehab  Cholestrol 100 - 199 mg/dL - 161  096  045  409   LDL (calc) 0 - 99 mg/dL - - 811  914  782   Direct LDL mg/dL - 956.2  - - -  HDL-C >13 mg/dL - 08.6  44  53  47   Trlycerides 0 - 149 mg/dL - 578  71  469  629   Hemoglobin A1c 4.8 - 5.6  % - - 5.7  - -  Bicarbonate - 27.2  - - - -  TCO2 - 29  - - - -    Capillary Blood Glucose: Lab Results  Component Value Date   GLUCAP 99 06/20/2015     Exercise Target Goals: Exercise Program Goal: Individual exercise prescription set using results from initial 6 min walk test and THRR while considering  patient's activity barriers and safety.   Exercise Prescription Goal: Starting with aerobic activity 30 plus minutes a day, 3 days per week for initial exercise prescription. Provide home exercise prescription and guidelines that participant acknowledges understanding prior to discharge.  Activity Barriers & Risk Stratification:  Activity Barriers & Cardiac Risk Stratification - 06/19/23 0857       Activity Barriers & Cardiac Risk Stratification   Activity Barriers Shortness of Breath;Chest Pain/Angina;Balance Concerns;Deconditioning    Cardiac Risk Stratification Moderate             6 Minute Walk:  6 Minute Walk     Row Name 06/19/23 1005         6 Minute Walk   Phase Initial     Distance 1500 feet     Walk Time 6 minutes     # of Rest Breaks 0     MPH 2.84     METS 2.67     RPE 11     Perceived Dyspnea  0     VO2 Peak 9.36     Symptoms No     Resting HR 55 bpm     Resting BP 120/60     Resting Oxygen Saturation  97 %     Exercise Oxygen Saturation  during 6 min walk 90 %     Max Ex. HR 73 bpm     Max Ex. BP 122/60     2 Minute Post BP 118/60              Oxygen Initial Assessment:   Oxygen Re-Evaluation:   Oxygen Discharge (Final Oxygen Re-Evaluation):   Initial Exercise Prescription:  Initial Exercise Prescription - 06/19/23 1000       Date of Initial Exercise RX and Referring Provider   Date 06/19/23    Referring Provider Miriam American MD      Treadmill   MPH 1.4    Grade 0    Minutes 15    METs 2.07      REL-XR   Level 2    Speed 60    Minutes 15  METs 2      Prescription Details   Frequency (times per week) 2     Duration Progress to 30 minutes of continuous aerobic without signs/symptoms of physical distress      Intensity   THRR 40-80% of Max Heartrate 90-125    Ratings of Perceived Exertion 11-13    Perceived Dyspnea 0-4      Resistance Training   Training Prescription Yes    Weight 4    Reps 10-15             Perform Capillary Blood Glucose checks as needed.  Exercise Prescription Changes:   Exercise Prescription Changes     Row Name 06/19/23 1000 07/04/23 1500 07/27/23 1500         Response to Exercise   Blood Pressure (Admit) 120/60 120/52 128/62     Blood Pressure (Exercise) 122/60 150/62 --     Blood Pressure (Exit) 118/60 120/60 104/50     Heart Rate (Admit) 55 bpm 58 bpm 59 bpm     Heart Rate (Exercise) 73 bpm 112 bpm 109 bpm     Heart Rate (Exit) 65 bpm 67 bpm 89 bpm     Oxygen Saturation (Admit) 97 % -- --     Oxygen Saturation (Exercise) 90 % -- --     Oxygen Saturation (Exit) 98 % -- --     Rating of Perceived Exertion (Exercise) 11 11 12      Perceived Dyspnea (Exercise) 0 -- --     Duration -- Continue with 30 min of aerobic exercise without signs/symptoms of physical distress. Continue with 30 min of aerobic exercise without signs/symptoms of physical distress.     Intensity -- THRR unchanged THRR unchanged       Progression   Progression -- Continue to progress workloads to maintain intensity without signs/symptoms of physical distress. Continue to progress workloads to maintain intensity without signs/symptoms of physical distress.       Resistance Training   Training Prescription -- Yes Yes     Weight -- 4 4     Reps -- 10-15 10-15       Treadmill   MPH -- 4 3.5     Grade -- 0.5 0     Minutes -- 15 15     METs -- 4.34 3.68       REL-XR   Level -- 5 5     Speed -- 59 59     Minutes -- 15 15     METs -- 3.4 4.8              Exercise Comments:   Exercise Comments     Row Name 06/22/23 1337           Exercise Comments First full  day of exercise!  Patient was oriented to gym and equipment including functions, settings, policies, and procedures.  Patient's individual exercise prescription and treatment plan were reviewed.  All starting workloads were established based on the results of the 6 minute walk test done at initial orientation visit.  The plan for exercise progression was also introduced and progression will be customized based on patient's performance and goals.                Exercise Goals and Review:   Exercise Goals     Row Name 06/19/23 1008             Exercise Goals   Increase Physical Activity Yes  Intervention Provide advice, education, support and counseling about physical activity/exercise needs.;Develop an individualized exercise prescription for aerobic and resistive training based on initial evaluation findings, risk stratification, comorbidities and participant's personal goals.       Expected Outcomes Short Term: Attend rehab on a regular basis to increase amount of physical activity.;Long Term: Add in home exercise to make exercise part of routine and to increase amount of physical activity.;Long Term: Exercising regularly at least 3-5 days a week.       Increase Strength and Stamina Yes       Intervention Provide advice, education, support and counseling about physical activity/exercise needs.;Develop an individualized exercise prescription for aerobic and resistive training based on initial evaluation findings, risk stratification, comorbidities and participant's personal goals.       Expected Outcomes Short Term: Increase workloads from initial exercise prescription for resistance, speed, and METs.;Short Term: Perform resistance training exercises routinely during rehab and add in resistance training at home;Long Term: Improve cardiorespiratory fitness, muscular endurance and strength as measured by increased METs and functional capacity ( )       Able to understand and use rate  of perceived exertion (RPE) scale Yes       Intervention Provide education and explanation on how to use RPE scale       Expected Outcomes Short Term: Able to use RPE daily in rehab to express subjective intensity level;Long Term:  Able to use RPE to guide intensity level when exercising independently       Able to understand and use Dyspnea scale Yes       Intervention Provide education and explanation on how to use Dyspnea scale       Expected Outcomes Short Term: Able to use Dyspnea scale daily in rehab to express subjective sense of shortness of breath during exertion;Long Term: Able to use Dyspnea scale to guide intensity level when exercising independently       Knowledge and understanding of Target Heart Rate Range (THRR) Yes       Intervention Provide education and explanation of THRR including how the numbers were predicted and where they are located for reference       Expected Outcomes Short Term: Able to state/look up THRR;Long Term: Able to use THRR to govern intensity when exercising independently;Short Term: Able to use daily as guideline for intensity in rehab       Able to check pulse independently Yes       Intervention Provide education and demonstration on how to check pulse in carotid and radial arteries.;Review the importance of being able to check your own pulse for safety during independent exercise       Expected Outcomes Short Term: Able to explain why pulse checking is important during independent exercise;Long Term: Able to check pulse independently and accurately       Understanding of Exercise Prescription Yes       Intervention Provide education, explanation, and written materials on patient's individual exercise prescription       Expected Outcomes Short Term: Able to explain program exercise prescription;Long Term: Able to explain home exercise prescription to exercise independently                Exercise Goals Re-Evaluation :  Exercise Goals Re-Evaluation      Row Name 06/22/23 1337             Exercise Goal Re-Evaluation   Exercise Goals Review Able to understand and use Dyspnea scale;Understanding of Exercise Prescription;Knowledge and  understanding of Target Heart Rate Range (THRR);Able to understand and use rate of perceived exertion (RPE) scale       Comments Reviewed RPE and dyspnea scale, THR and program prescription with pt today.  Pt voiced understanding and was given a copy of goals to take home.       Expected Outcomes Short: Use RPE daily to regulate intensity.  Long: Follow program prescription in THR.                 Discharge Exercise Prescription (Final Exercise Prescription Changes):  Exercise Prescription Changes - 07/27/23 1500       Response to Exercise   Blood Pressure (Admit) 128/62    Blood Pressure (Exit) 104/50    Heart Rate (Admit) 59 bpm    Heart Rate (Exercise) 109 bpm    Heart Rate (Exit) 89 bpm    Rating of Perceived Exertion (Exercise) 12    Duration Continue with 30 min of aerobic exercise without signs/symptoms of physical distress.    Intensity THRR unchanged      Progression   Progression Continue to progress workloads to maintain intensity without signs/symptoms of physical distress.      Resistance Training   Training Prescription Yes    Weight 4    Reps 10-15      Treadmill   MPH 3.5    Grade 0    Minutes 15    METs 3.68      REL-XR   Level 5    Speed 59    Minutes 15    METs 4.8             Nutrition:  Target Goals: Understanding of nutrition guidelines, daily intake of sodium 1500mg , cholesterol 200mg , calories 30% from fat and 7% or less from saturated fats, daily to have 5 or more servings of fruits and vegetables.  Biometrics:  Pre Biometrics - 06/19/23 1008       Pre Biometrics   Height 5' 11.5" (1.816 m)    Weight 188 lb 15 oz (85.7 kg)    Waist Circumference 40 inches    Hip Circumference 38 inches    Waist to Hip Ratio 1.05 %    BMI (Calculated)  25.99    Grip Strength 44.3 kg    Single Leg Stand 35.8 seconds              Nutrition Therapy Plan and Nutrition Goals:   Nutrition Assessments:  MEDIFICTS Score Key: >=70 Need to make dietary changes  40-70 Heart Healthy Diet <= 40 Therapeutic Level Cholesterol Diet   Picture Your Plate Scores: <16 Unhealthy dietary pattern with much room for improvement. 41-50 Dietary pattern unlikely to meet recommendations for good health and room for improvement. 51-60 More healthful dietary pattern, with some room for improvement.  >60 Healthy dietary pattern, although there may be some specific behaviors that could be improved.    Nutrition Goals Re-Evaluation:   Nutrition Goals Discharge (Final Nutrition Goals Re-Evaluation):   Psychosocial: Target Goals: Acknowledge presence or absence of significant depression and/or stress, maximize coping skills, provide positive support system. Participant is able to verbalize types and ability to use techniques and skills needed for reducing stress and depression.  Initial Review & Psychosocial Screening:  Initial Psych Review & Screening - 06/19/23 0913       Initial Review   Current issues with History of Depression;Current Anxiety/Panic;Current Depression;Current Sleep Concerns;Current Psychotropic Meds   Patient severed in the [employer] in [place]. He  has PTSD.     Family Dynamics   Good Support System? Yes      Barriers   Psychosocial barriers to participate in program The patient should benefit from training in stress management and relaxation.;There are no identifiable barriers or psychosocial needs.      Screening Interventions   Interventions Encouraged to exercise;Provide feedback about the scores to participant;To provide support and resources with identified psychosocial needs    Expected Outcomes Short Term goal: Utilizing psychosocial counselor, staff and physician to assist with identification of specific Stressors or  current issues interfering with healing process. Setting desired goal for each stressor or current issue identified.;Long Term Goal: Stressors or current issues are controlled or eliminated.;Short Term goal: Identification and review with participant of any Quality of Life or Depression concerns found by scoring the questionnaire.;Long Term goal: The participant improves quality of Life and PHQ9 Scores as seen by post scores and/or verbalization of changes             Quality of Life Scores:  Scores of 19 and below usually indicate a poorer quality of life in these areas.  A difference of  2-3 points is a clinically meaningful difference.  A difference of 2-3 points in the total score of the Quality of Life Index has been associated with significant improvement in overall quality of life, self-image, physical symptoms, and general health in studies assessing change in quality of life.  PHQ-9: Review Flowsheet       06/19/2023  Depression screen PHQ 2/9  Decreased Interest 2  Down, Depressed, Hopeless 3  PHQ - 2 Score 5  Altered sleeping 1  Tired, decreased energy 3  Change in appetite 0  Feeling bad or failure about yourself  3  Trouble concentrating 2  Moving slowly or fidgety/restless 2  Suicidal thoughts 0  PHQ-9 Score 16  Difficult doing work/chores Somewhat difficult   Interpretation of Total Score  Total Score Depression Severity:  1-4 = Minimal depression, 5-9 = Mild depression, 10-14 = Moderate depression, 15-19 = Moderately severe depression, 20-27 = Severe depression   Psychosocial Evaluation and Intervention:  Psychosocial Evaluation - 06/19/23 0943       Psychosocial Evaluation & Interventions   Interventions Stress management education;Relaxation education;Encouraged to exercise with the program and follow exercise prescription    Comments Patient was referred to CR from the [place] with ischemic HD and stent placement 2/17. He had 3 stents placed at [facility] and has a  history of MI with 4 stents placed some years ago. He severed 4 years in the [place] in [place]. His initial PHQ-9 score was 18. He says he does have some depression/aneixty and PTSD. He is followed by behavorial health at the [facility] q 2 weeks and takes Alprazolam . He feels his psychosocial issues are managed well. He had prostate cancer several years ago and was treated with surgery, radiation and lupron. 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 He used to exercise and has equipment at home for exercise but says he just does not have the energy to do anything right now. He reports trouble both getting to sleep and staying asleep. He likes to work with Scientist, physiological and he likes to fix things. 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 His wife is his main support along with her 2 sons. He seems  motivated to participate in the program. He wants to start with 2 days/week and may increase to 3. He has also been referred to PT/OT by the [facility] and is not sure yet what this will entail. His main goals for the program are to get his muscle mass back; improve his SOB and energy and get back to exercising at home and doing the things he enjoys. He has no barriers identified to complete the program.    Expected Outcomes Short Term: Patient will start the program and attend consistently. Long Term: Patient will complete the program meeting personal goals.    Continue Psychosocial Services  Follow up required by staff             Psychosocial Re-Evaluation:   Psychosocial Discharge (Final Psychosocial Re-Evaluation):   Vocational Rehabilitation: Provide vocational rehab assistance to qualifying candidates.   Vocational Rehab Evaluation & Intervention:  Vocational Rehab - 06/19/23 0912       Initial Vocational Rehab Evaluation & Intervention   Assessment shows need for Vocational Rehabilitation No       Vocational Rehab Re-Evaulation   Comments Patient is retired.             Education: Education Goals: Education classes will be provided on a weekly basis, covering required topics. Participant will state understanding/return demonstration of topics presented.  Learning Barriers/Preferences:  Learning Barriers/Preferences - 06/19/23 9147       Learning Barriers/Preferences   Learning Barriers Sight   Has difficulty with close vision due to CVA several years ago.   Learning Preferences Skilled Demonstration;Pictoral             Education Topics: Hypertension, Hypertension Reduction -Define heart disease and high blood pressure. Discus how high blood pressure affects the body and ways to reduce high blood pressure.   Exercise and Your Heart -Discuss why it is important to exercise, the FITT principles of exercise, normal and abnormal responses to exercise, and how to exercise safely. Flowsheet Row CARDIAC REHAB PHASE II EXERCISE from 07/27/2023 in [place] CARDIAC REHABILITATION  Date 07/27/23  Educator hb  Instruction Review Code 1- Verbalizes Understanding       Angina -Discuss definition of angina, causes of angina, treatment of angina, and how to decrease risk of having angina.   Cardiac Medications -Review what the following cardiac medications are used for, how they affect the body, and side effects that may occur when taking the medications.  Medications include Aspirin , Beta blockers, calcium channel blockers, ACE Inhibitors, angiotensin receptor blockers, diuretics, digoxin, and antihyperlipidemics. Flowsheet Row CARDIAC REHAB PHASE II EXERCISE from 07/27/2023 in [place] CARDIAC REHABILITATION  Date 07/06/23  Educator hb  Instruction Review Code 1- Verbalizes Understanding       Congestive Heart Failure -Discuss the definition of CHF, how to live with CHF, the signs and symptoms of CHF, and how keep track of weight and sodium intake. Flowsheet  Row CARDIAC REHAB PHASE II EXERCISE from 07/27/2023 in [place] CARDIAC REHABILITATION  Date 06/29/23  Educator [facility]  Instruction Review Code 1- Verbalizes Understanding       Heart Disease and Intimacy -Discus the effect sexual activity has on the heart, how changes occur during intimacy as we age, and safety during sexual activity.   Smoking Cessation / COPD -Discuss different methods to quit smoking, the health benefits of quitting smoking, and the definition of COPD.   Nutrition I: Fats -Discuss the types of cholesterol, what cholesterol does to the heart, and how cholesterol  levels can be controlled.   Nutrition II: Labels -Discuss the different components of food labels and how to read food label   Heart Parts/Heart Disease and PAD -Discuss the anatomy of the heart, the pathway of blood circulation through the heart, and these are affected by heart disease.   Stress I: Signs and Symptoms -Discuss the causes of stress, how stress may lead to anxiety and depression, and ways to limit stress. Flowsheet Row CARDIAC REHAB PHASE II EXERCISE from 07/27/2023 in [place] CARDIAC REHABILITATION  Date 06/22/23  Educator DJ  Instruction Review Code 1- Verbalizes Understanding       Stress II: Relaxation -Discuss different types of relaxation techniques to limit stress.   Warning Signs of Stroke / TIA -Discuss definition of a stroke, what the signs and symptoms are of a stroke, and how to identify when someone is having stroke.   Knowledge Questionnaire Score:   Core Components/Risk Factors/Patient Goals at Admission:  Personal Goals and Risk Factors at Admission - 06/19/23 0913       Core Components/Risk Factors/Patient Goals on Admission    Weight Management Weight Maintenance    Improve shortness of breath with ADL's Yes    Intervention Provide education, individualized exercise plan and daily activity instruction to help decrease symptoms of SOB with activities of  daily living.    Expected Outcomes Short Term: Improve cardiorespiratory fitness to achieve a reduction of symptoms when performing ADLs;Long Term: Be able to perform more ADLs without symptoms or delay the onset of symptoms    Hypertension Yes    Intervention Provide education on lifestyle modifcations including regular physical activity/exercise, weight management, moderate sodium restriction and increased consumption of fresh fruit, vegetables, and low fat dairy, alcohol moderation, and smoking cessation.;Monitor prescription use compliance.    Expected Outcomes Short Term: Continued assessment and intervention until BP is < 140/72mm HG in hypertensive participants. < 130/60mm HG in hypertensive participants with diabetes, heart failure or chronic kidney disease.;Long Term: Maintenance of blood pressure at goal levels.    Lipids Yes    Intervention Provide education and support for participant on nutrition & aerobic/resistive exercise along with prescribed medications to achieve LDL 70mg , HDL >40mg .    Expected Outcomes Short Term: Participant states understanding of desired cholesterol values and is compliant with medications prescribed. Participant is following exercise prescription and nutrition guidelines.;Long Term: Cholesterol controlled with medications as prescribed, with individualized exercise RX and with personalized nutrition plan. Value goals: LDL < 70mg , HDL > 40 mg.             Core Components/Risk Factors/Patient Goals Review:    Core Components/Risk Factors/Patient Goals at Discharge (Final Review):    ITP Comments:  ITP Comments     Row Name 06/19/23 1116 06/22/23 1337 07/03/23 0800 07/05/23 1146 08/02/23 1450   ITP Comments Patient arrived for 1st visit/orientation/education at 0800. Patient was referred to CR by Dr. Wyn Heater at the [facility] due to Ischemic Heart Disease and stent placement. During orientation advised patient on arrival and appointment times what to  wear, what to do before, during and after exercise. Reviewed attendance and class policy.  Pt is scheduled to return Cardiac Rehab on 06/20/23  at 1330. Pt was advised to come to class 15 minutes before class starts.  Discussed RPE/Dpysnea scales. Patient participated in warm up stretches. Patient was able to complete 6 minute walk test.  Telemetry:NSR. Patient was measured for the equipment. Discussed equipment safety with patient. Took patient pre-anthropometric measurements.  Patient finished visit at 1013. First full day of exercise!  Patient was oriented to gym and equipment including functions, settings, policies, and procedures.  Patient's individual exercise prescription and treatment plan were reviewed.  All starting workloads were established based on the results of the 6 minute walk test done at initial orientation visit.  The plan for exercise progression was also introduced and progression will be customized based on patient's performance and goals. Sent Progress report and ITP to [place] via [employer] routing 07/03/23. 30 day review completed. ITP sent to Dr. Armida Lander, Medical Director of Cardiac Rehab. Continue with ITP unless changes are made by physician. 30 day review completed. ITP sent to Dr. Armida Lander, Medical Director of Cardiac Rehab. Continue with ITP unless changes are made by physician.    Row Name 08/04/23 1207           ITP Comments Mr. Pittinger called to let us  know that he has several upcoming appts and two eye surgeries. We offered a medical hold, but he requested to be discharged from the program at this time.                Comments: Discharge ITP

## 2023-08-04 NOTE — Progress Notes (Signed)
 Discharge Progress Report  Patient Details  Name: Trevor Branch MRN: [id number] Date of Birth: [birth year] Referring Provider:   Flowsheet Row CARDIAC REHAB PHASE II ORIENTATION from 06/19/2023 in [facility] CARDIAC REHABILITATION  Referring Provider Miriam American MD        Number of Visits: 14  Reason for Discharge:  Early Exit:  Personal  Smoking History:  Social History   Tobacco Use  Smoking Status Never  Smokeless Tobacco Never    Diagnosis:  Chronic ischemic heart disease  Status post coronary artery stent placement  Initial Exercise Prescription:  Initial Exercise Prescription - 06/19/23 1000       Date of Initial Exercise RX and Referring Provider   Date 06/19/23    Referring Provider Miriam American MD      Treadmill   MPH 1.4    Grade 0    Minutes 15    METs 2.07      REL-XR   Level 2    Speed 60    Minutes 15    METs 2      Prescription Details   Frequency (times per week) 2    Duration Progress to 30 minutes of continuous aerobic without signs/symptoms of physical distress      Intensity   THRR 40-80% of Max Heartrate 90-125    Ratings of Perceived Exertion 11-13    Perceived Dyspnea 0-4      Resistance Training   Training Prescription Yes    Weight 4    Reps 10-15             Discharge Exercise Prescription (Final Exercise Prescription Changes):  Exercise Prescription Changes - 07/27/23 1500       Response to Exercise   Blood Pressure (Admit) 128/62    Blood Pressure (Exit) 104/50    Heart Rate (Admit) 59 bpm    Heart Rate (Exercise) 109 bpm    Heart Rate (Exit) 89 bpm    Rating of Perceived Exertion (Exercise) 12    Duration Continue with 30 min of aerobic exercise without signs/symptoms of physical distress.    Intensity THRR unchanged      Progression   Progression Continue to progress workloads to maintain intensity without signs/symptoms of physical distress.      Resistance Training   Training Prescription Yes     Weight 4    Reps 10-15      Treadmill   MPH 3.5    Grade 0    Minutes 15    METs 3.68      REL-XR   Level 5    Speed 59    Minutes 15    METs 4.8             Functional Capacity:  6 Minute Walk     Row Name 06/19/23 1005         6 Minute Walk   Phase Initial     Distance 1500 feet     Walk Time 6 minutes     # of Rest Breaks 0     MPH 2.84     METS 2.67     RPE 11     Perceived Dyspnea  0     VO2 Peak 9.36     Symptoms No     Resting HR 55 bpm     Resting BP 120/60     Resting Oxygen Saturation  97 %     Exercise Oxygen Saturation  during 6 min walk  90 %     Max Ex. HR 73 bpm     Max Ex. BP 122/60     2 Minute Post BP 118/60              Psychological, QOL, Others - Outcomes: PHQ 2/9:    06/19/2023    9:36 AM  Depression screen PHQ 2/9  Decreased Interest 2  Down, Depressed, Hopeless 3  PHQ - 2 Score 5  Altered sleeping 1  Tired, decreased energy 3  Change in appetite 0  Feeling bad or failure about yourself  3  Trouble concentrating 2  Moving slowly or fidgety/restless 2  Suicidal thoughts 0  PHQ-9 Score 16  Difficult doing work/chores Somewhat difficult      Nutrition & Weight - Outcomes:  Pre Biometrics - 06/19/23 1008       Pre Biometrics   Height 5' 11.5" (1.816 m)    Weight 188 lb 15 oz (85.7 kg)    Waist Circumference 40 inches    Hip Circumference 38 inches    Waist to Hip Ratio 1.05 %    BMI (Calculated) 25.99    Grip Strength 44.3 kg    Single Leg Stand 35.8 seconds

## 2023-08-08 ENCOUNTER — Encounter (HOSPITAL_COMMUNITY)

## 2023-08-10 ENCOUNTER — Encounter (HOSPITAL_COMMUNITY)

## 2023-08-10 DIAGNOSIS — L821 Other seborrheic keratosis: Secondary | ICD-10-CM | POA: Diagnosis not present

## 2023-08-10 DIAGNOSIS — L570 Actinic keratosis: Secondary | ICD-10-CM | POA: Diagnosis not present

## 2023-08-10 DIAGNOSIS — Z85828 Personal history of other malignant neoplasm of skin: Secondary | ICD-10-CM | POA: Diagnosis not present

## 2023-08-10 DIAGNOSIS — D692 Other nonthrombocytopenic purpura: Secondary | ICD-10-CM | POA: Diagnosis not present

## 2023-08-10 DIAGNOSIS — D225 Melanocytic nevi of trunk: Secondary | ICD-10-CM | POA: Diagnosis not present

## 2023-08-15 ENCOUNTER — Encounter (HOSPITAL_COMMUNITY)

## 2023-08-17 ENCOUNTER — Encounter (HOSPITAL_COMMUNITY)

## 2023-08-22 ENCOUNTER — Encounter (HOSPITAL_COMMUNITY)

## 2023-08-24 ENCOUNTER — Encounter (HOSPITAL_COMMUNITY)

## 2023-08-29 ENCOUNTER — Encounter (HOSPITAL_COMMUNITY)

## 2023-08-31 ENCOUNTER — Encounter (HOSPITAL_COMMUNITY)

## 2023-09-05 ENCOUNTER — Encounter (HOSPITAL_COMMUNITY)

## 2023-09-07 ENCOUNTER — Encounter (HOSPITAL_COMMUNITY)

## 2023-09-12 ENCOUNTER — Encounter (HOSPITAL_COMMUNITY)

## 2023-09-14 ENCOUNTER — Encounter (HOSPITAL_COMMUNITY)

## 2023-09-19 ENCOUNTER — Encounter (HOSPITAL_COMMUNITY)

## 2023-09-21 ENCOUNTER — Encounter (HOSPITAL_COMMUNITY)

## 2023-09-26 ENCOUNTER — Encounter (HOSPITAL_COMMUNITY)

## 2023-09-28 ENCOUNTER — Encounter (HOSPITAL_COMMUNITY)

## 2023-10-03 ENCOUNTER — Encounter (HOSPITAL_COMMUNITY)

## 2023-10-05 ENCOUNTER — Encounter (HOSPITAL_COMMUNITY)

## 2023-10-10 ENCOUNTER — Encounter (HOSPITAL_COMMUNITY)

## 2023-10-12 ENCOUNTER — Encounter (HOSPITAL_COMMUNITY)

## 2023-10-17 ENCOUNTER — Encounter (HOSPITAL_COMMUNITY)

## 2023-10-19 ENCOUNTER — Encounter (HOSPITAL_COMMUNITY)

## 2024-02-15 DIAGNOSIS — D225 Melanocytic nevi of trunk: Secondary | ICD-10-CM | POA: Diagnosis not present

## 2024-02-15 DIAGNOSIS — D485 Neoplasm of uncertain behavior of skin: Secondary | ICD-10-CM | POA: Diagnosis not present

## 2024-02-15 DIAGNOSIS — Z85828 Personal history of other malignant neoplasm of skin: Secondary | ICD-10-CM | POA: Diagnosis not present

## 2024-02-15 DIAGNOSIS — L821 Other seborrheic keratosis: Secondary | ICD-10-CM | POA: Diagnosis not present

## 2024-02-15 DIAGNOSIS — D0461 Carcinoma in situ of skin of right upper limb, including shoulder: Secondary | ICD-10-CM | POA: Diagnosis not present

## 2024-02-15 DIAGNOSIS — L570 Actinic keratosis: Secondary | ICD-10-CM | POA: Diagnosis not present
